# Patient Record
Sex: Male | Born: 1985 | Race: White | Hispanic: No | Marital: Single | Smoking: Never smoker
Health system: Southern US, Community
[De-identification: ages and names within clinical notes are randomized; demographics above are authoritative.]

## PROBLEM LIST (undated history)

## (undated) VITALS — BP 131/80 | HR 89 | Temp 98.0°F | Resp 18

## (undated) DIAGNOSIS — F259 Schizoaffective disorder, unspecified: Secondary | ICD-10-CM

## (undated) DIAGNOSIS — F329 Major depressive disorder, single episode, unspecified: Secondary | ICD-10-CM

## (undated) DIAGNOSIS — I10 Essential (primary) hypertension: Secondary | ICD-10-CM

## (undated) DIAGNOSIS — F32A Depression, unspecified: Secondary | ICD-10-CM

## (undated) DIAGNOSIS — F209 Schizophrenia, unspecified: Secondary | ICD-10-CM

## (undated) DIAGNOSIS — F419 Anxiety disorder, unspecified: Secondary | ICD-10-CM

## (undated) HISTORY — PX: BACK SURGERY: SHX140

---

## 2005-10-08 ENCOUNTER — Emergency Department (HOSPITAL_COMMUNITY): Admission: EM | Admit: 2005-10-08 | Discharge: 2005-10-08 | Payer: Self-pay | Admitting: *Deleted

## 2005-10-09 ENCOUNTER — Emergency Department (HOSPITAL_COMMUNITY): Admission: EM | Admit: 2005-10-09 | Discharge: 2005-10-10 | Payer: Self-pay | Admitting: Emergency Medicine

## 2008-06-25 ENCOUNTER — Other Ambulatory Visit: Payer: Self-pay | Admitting: Emergency Medicine

## 2008-06-25 ENCOUNTER — Inpatient Hospital Stay (HOSPITAL_COMMUNITY): Admission: EM | Admit: 2008-06-25 | Discharge: 2008-06-28 | Payer: Self-pay | Admitting: Psychiatry

## 2008-06-25 ENCOUNTER — Ambulatory Visit: Payer: Self-pay | Admitting: Psychiatry

## 2008-07-09 ENCOUNTER — Inpatient Hospital Stay (HOSPITAL_COMMUNITY): Admission: EM | Admit: 2008-07-09 | Discharge: 2008-07-14 | Payer: Self-pay | Admitting: Psychiatry

## 2009-01-18 ENCOUNTER — Other Ambulatory Visit: Payer: Self-pay | Admitting: Emergency Medicine

## 2009-01-20 ENCOUNTER — Ambulatory Visit: Payer: Self-pay | Admitting: Psychiatry

## 2009-01-20 ENCOUNTER — Inpatient Hospital Stay (HOSPITAL_COMMUNITY): Admission: RE | Admit: 2009-01-20 | Discharge: 2009-01-23 | Payer: Self-pay | Admitting: Psychiatry

## 2010-08-24 LAB — POCT I-STAT, CHEM 8
Calcium, Ion: 1.25 mmol/L (ref 1.12–1.32)
Chloride: 104 mEq/L (ref 96–112)
Creatinine, Ser: 0.9 mg/dL (ref 0.4–1.5)
Potassium: 3.8 mEq/L (ref 3.5–5.1)
Sodium: 138 mEq/L (ref 135–145)

## 2010-08-24 LAB — DIFFERENTIAL
Basophils Absolute: 0 10*3/uL (ref 0.0–0.1)
Basophils Relative: 0 % (ref 0–1)
Eosinophils Absolute: 0.2 10*3/uL (ref 0.0–0.7)
Eosinophils Relative: 2 % (ref 0–5)
Neutrophils Relative %: 71 % (ref 43–77)

## 2010-08-24 LAB — CBC
HCT: 46.6 % (ref 39.0–52.0)
Hemoglobin: 16.4 g/dL (ref 13.0–17.0)
MCHC: 35.1 g/dL (ref 30.0–36.0)
MCV: 85.7 fL (ref 78.0–100.0)
RBC: 5.43 MIL/uL (ref 4.22–5.81)
RDW: 12.7 % (ref 11.5–15.5)

## 2010-08-24 LAB — RAPID URINE DRUG SCREEN, HOSP PERFORMED
Amphetamines: NOT DETECTED
Barbiturates: NOT DETECTED
Benzodiazepines: NOT DETECTED
Cocaine: NOT DETECTED
Tetrahydrocannabinol: NOT DETECTED

## 2010-08-24 LAB — ETHANOL: Alcohol, Ethyl (B): <5 mg/dL (ref 0–10)

## 2010-09-04 LAB — BASIC METABOLIC PANEL
BUN: 12 mg/dL (ref 6–23)
CO2: 27 mEq/L (ref 19–32)
Creatinine, Ser: 1.1 mg/dL (ref 0.4–1.5)
Potassium: 3.5 mEq/L (ref 3.5–5.1)

## 2010-09-04 LAB — POCT TOXICOLOGY PANEL: Cocaine: POSITIVE

## 2010-09-04 LAB — CBC
HCT: 46.2 % (ref 39.0–52.0)
MCV: 86 fL (ref 78.0–100.0)
RDW: 11.5 % (ref 11.5–15.5)
WBC: 10.5 10*3/uL (ref 4.0–10.5)

## 2010-09-04 LAB — DIFFERENTIAL
Basophils Relative: 1 % (ref 0–1)
Lymphocytes Relative: 20 % (ref 12–46)
Lymphs Abs: 2.1 10*3/uL (ref 0.7–4.0)
Monocytes Absolute: 0.6 10*3/uL (ref 0.1–1.0)
Monocytes Relative: 6 % (ref 3–12)
Neutro Abs: 7.4 10*3/uL (ref 1.7–7.7)
Neutrophils Relative %: 71 % (ref 43–77)

## 2010-09-04 LAB — URINALYSIS, ROUTINE W REFLEX MICROSCOPIC
Bilirubin Urine: NEGATIVE
Protein, ur: NEGATIVE mg/dL
Specific Gravity, Urine: 1.025 (ref 1.005–1.030)
Urobilinogen, UA: 2 mg/dL — ABNORMAL HIGH (ref 0.0–1.0)
pH: 6.5 (ref 5.0–8.0)

## 2010-10-02 NOTE — H&P (Signed)
NAME:  [name], [name]             ACCOUNT NO.:  [id number]   MEDICAL RECORD NO.:  [id number]          PATIENT TYPE:  IPS   LOCATION:  0400                          FACILITY:  BH   PHYSICIAN:  Vic Ripper, P.A.-C.DATE OF BIRTH:  May 28, 1985   DATE OF ADMISSION:  07/09/2008  DATE OF DISCHARGE:                       PSYCHIATRIC ADMISSION ASSESSMENT   This is a voluntary admission to the services of Dr. Geralyn Flash.   IDENTIFYING INFORMATION:  This is a 25 year old, single, white male.  He  presented to the admissions office here yesterday.  He reported that he  was here this time on his own because he needs help with his cocaine  addiction.  [facility]  Yesterday, he stated that he has not been taking his  medication and did not go to this followup appointment.  Yesterday, he  had disorganized thinking, poor concentration, poor memory, paranoid  ideation.  He reported seeing people who are dead.  He had spent his  disability check on cocaine, and his mother's house is in danger of  foreclosure.   PAST PSYCHIATRIC HISTORY:  He was just recently with [place] February 6  through February 9.  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  He had been poorly compliant with meds and had  last received an injection of an antipsychotic medication in November of  2009.  He presented at that time with paranoia, thought disorganization,  and delusionality.   SOCIAL HISTORY:  He reports that he had 1.5 years at [employer] actually  studying Psychology.  He is currently staying with his mom.  He gets an  SSDI check of about $700 a month.   FAMILY HISTORY:  Negative.   ALCOHOL AND DRUG HISTORY:  He is a nonsmoker.  He has been using cocaine  and marijuana of late.   PRIMARY CARE Raquell Richer:  [facility], and he goes to [facility].   MEDICAL PROBLEMS:  None.   MEDICATIONS:  At the time of his discharge on February 9th, he was on  Zyprexa Zydis 5 mg 1 b.i.d.   DRUG ALLERGIES:  No known drug allergies.   POSITIVE PHYSICAL FINDINGS:  His labs were not repeated as nobody  ordered any yesterday and he was just discharged.  His vital signs on  admission show that he is 69 inches tall, weighs 229, temperature is  98.1, blood pressure was 150/92, pulse 101, respirations 20.   MENTAL STATUS EXAM:  He is alert and oriented.  He was casually but  appropriately groomed, dressed, and nourished.  His speech was clear,  coherent.  Thought processes were clear, rational, and goal oriented.  He wants to break his addiction to cocaine.  He denied being suicidal or  homicidal.  Judgment and insight were better.  Concentration and memory  were better.  Intelligence is at least average.  He stated that the  Zyprexa had been clearing his thoughts and he is less disoriented.   DIAGNOSES:  AXIS I:  1. Cocaine abuse, rule out dependence.  2. Psychosis, not otherwise specified, versus substance-induced      psychosis.   AXIS II:  Deferred.   AXIS III:  Obesity.   AXIS IV:  Severe stressors, mostly financial.   AXIS V:  Twenty-five.   The plan is to reestablish compliance with his Zyprexa and to address  his cravings through Symmetrel 100 mg p.o. b.i.d.   ESTIMATED LENGTH OF STAY:  Three to 5 days.      Vic Ripper, P.A.-C.     MD/MEDQ  D:  07/10/2008  T:  07/10/2008  Job:  [phone]

## 2010-10-02 NOTE — H&P (Signed)
NAMEHURBERT, [name] NO.:  [id number]   MEDICAL RECORD NO.:  [id number]          PATIENT TYPE:  IPS   LOCATION:  0402                          FACILITY:  BH   PHYSICIAN:  Anselm Jungling, MD  DATE OF BIRTH:  April 04, 1986   DATE OF ADMISSION:  06/25/2008  DATE OF DISCHARGE:                       PSYCHIATRIC ADMISSION ASSESSMENT   The patient is a 25 year old male who was involuntarily petitioned on  June 25, 2008.   HISTORY OF PRESENT ILLNESS:  The patient is here on petition with papers  stating the patient has a history of catatonic schizophrenia,  hospitalized in June of 2007.  The patient has been poorly compliant  with medications.  Last received injection in November of 2009.  Today,  the patient is paranoid, disorganized and delusional.  Stole his  mother's car and was arrested, stating that the car blew up.  Using  cocaine sporadically.  The patient does report some sort of voices.  Does report that he is here for cocaine addiction.   PAST PSYCHIATRIC HISTORY:  First admission to the [facility].  Is a client at [facility].   SOCIAL HISTORY:  He is a 25 year old male who lives with his mother.  Reports a good relationship.  He finished high school is and on  disability.   FAMILY HISTORY:  Unknown.   ALCOHOL AND DRUG HISTORY:  He is a nonsmoker.  Denies any IV drug use,  and again has been using cocaine and marijuana.   PRIMARY CARE Jachob Mcclean:  [facility].   MEDICAL PROBLEMS:  The patient reports he is healthy.  Denies any acute  or chronic health issues.   MEDICATIONS:  Has been on Prolixin in the past.  Apparently was  receiving injections.  He reporting having problems with vivid dreams  while on medications.  Has been noncompliant with psychotropic  medications for some time.   DRUG ALLERGIES:  No known drug allergies.   PHYSICAL EXAMINATION:  GENERAL:  This appears to be a healthy, well-  nourished, well-developed male, fully assessed at [facility].  Physical exam was reviewed with no significant findings.  VITAL SIGNS:  Temperature of 98, 70 heart rate, 20 respirations, blood  pressure is 120/79.   LABORATORY DATA:  A urine drug screen is positive for THC, positive for  cocaine.  Alcohol level less than 5.  Glucose of 123.  CBC within normal  limits.  Urinalysis is negative.   MENTAL STATUS EXAM:  He is fully alert, cooperative, walking in the  hallway.  He has good eye contact.  Currently wearing hospital gown.  Easily approachable.  His speech is unclear.  He is pleasant and has a  sense humor about being hospitalized.  Overall, thought process are  coherent, although there still to be some moments of disorganization,  talk about hallucinations.  He does not appear to be actively  responding.  Cognitive function intact.  His judgment and insight is  fair.   IMPRESSION:  AXIS I:  Mood disorder, polysubstance abuse, history of  schizophrenia.  AXIS II:  Deferred.  AXIS III:  No known medical conditions.  AXIS IV:  Substance use.  AXIS V:  Current is 30.   PLAN:  Our plan is to contract for safety.  We will stabilize mood and  thinking.  Will have Zyprexa available on a p.r.n. basis for psychosis  and agitation.  Will adjust the patient's substance use.  Will contact  family for background and support and will reinforce medication  compliance and follow up.  Tentative length of stay at this time is 2-3  days.      Landry Corporal, N.P.      Anselm Jungling, MD  Electronically Signed    JO/MEDQ  D:  06/27/2008  T:  06/27/2008  Job:  [id number]

## 2010-10-02 NOTE — Discharge Summary (Signed)
NAMEMAK, BONNY             ACCOUNT NO.:  [id number]   MEDICAL RECORD NO.:  [id number]          PATIENT TYPE:  IPS   LOCATION:  0401                          FACILITY:  BH   PHYSICIAN:  Anselm Jungling, MD  DATE OF BIRTH:  [birth year]   DATE OF ADMISSION:  06/25/2008  DATE OF DISCHARGE:  06/28/2008                               DISCHARGE SUMMARY   IDENTIFYING DATA AND REASON FOR ADMISSION:  This was the first [facility]  admission for [place], a 25 year old male who was involuntarily committed  due to psychosis.  He came to [place] with a history of catatonic  schizophrenia, previously hospitalized elsewhere in June of 2007.  He  had recently been poorly compliant with medications.  He had last  received an injection of antipsychotic medication in November of 2009.  He presented with paranoia, thought disorganization, and delusionality.  Please refer to the admission note for further details pertaining to the  symptoms, circumstances and history that led to his hospitalization.  He  was given an initial Axis I diagnosis of mood disorder NOS,  polysubstance abuse and history of schizophrenia.   MEDICAL AND LABORATORY:  He was in good health without any active or  chronic medical problems.  He was medically and physically assessed by  the psychiatric nurse practitioner.  There were no significant medical  issues.   HOSPITAL COURSE:  The patient was admitted to the adult inpatient  psychiatric service.  He presented as a well-nourished, normally-  developed adult male who was quite approachable, but had unclear speech  in the initial interview.  His thought processes were fairly coherent,  although there were still some moments of disorganization, and he  admitted to hallucinations.   The patient was treated with a psychotropic regimen of Zyprexa, up to 5  mg b.i.d.  The patient had a very rapid and apparently complete response  to this.  He was pleasant and cooperative throughout his  stay.  By the  fourth hospital day, he appeared appropriate for discharge.  He agreed  to the following aftercare plan.   AFTERCARE:  The patient was to follow up at [facility] with an appointment to see their psychiatrist on July 05, 2008 at 8:20.   DISCHARGE MEDICATIONS:  Zyprexa Zydis 5 mg one b.i.d.   DISCHARGE DIAGNOSES:  AXIS I:  Psychosis NOS, resolving.  AXIS II:  Deferred.  AXIS III:  No acute or chronic illnesses.  AXIS IV:  Stressors severe.  AXIS V:  GAF on discharge 55.      Anselm Jungling, MD  Electronically Signed    SPB/MEDQ  D:  07/04/2008  T:  07/04/2008  Job:  [id number]

## 2010-10-05 NOTE — Discharge Summary (Signed)
[name], [name] NO.:  [id number]   MEDICAL RECORD NO.:  [id number]          PATIENT TYPE:  IPS   LOCATION:  0400                          FACILITY:  BH   PHYSICIAN:  [name], M.D. DATE OF BIRTH:  February 13, 1986   DATE OF ADMISSION:  07/09/2008  DATE OF DISCHARGE:  07/14/2008                               DISCHARGE SUMMARY   IDENTIFICATION:  This is a 25 year old single white male, who was  admitted on a voluntary basis on July 09, 2008.   IDENTIFYING INFORMATION:  This is a 25 year old single white male. He  presented to the admissions office yesterday.  He reported that he was  here this time on his own because he needs help with his cocaine  addiction.  He had been discharged from [facility] on  June 28, 2008.  Yesterday, he stated he has not been taking his  medication and did not go to his followup appointment.  Yesterday, he  had disorganized thinking, poor concentration, poor memory, paranoid  ideation.  He reported seeing people who are dead.  He had spent his  disability check on cocaine and his mother's house is in danger of  foreclosure.   PAST PSYCHIATRIC HISTORY:  The patient was just recently with [place] from  June 25, 2008, through June 28, 2008.  At that time, he came in  with a history of catatonic schizophrenia having previously been  hospitalized somewhere else in June 2007.  He had been poorly compliant  with medications and he last received an injection of an antipsychotic  medication in November 2009.  He presented at that time with paranoia,  thought disorganization, and delusional thinking.   FAMILY HISTORY:  Negative.   ALCOHOL AND DRUG HISTORY:  He is a nonsmoker.  He has been using cocaine  and marijuana lately.   MEDICAL PROBLEMS:  None.   MEDICATIONS:  At the time of discharge on June 28, 2008, he was on  Zyprexa Zydis 5 mg 1 p.o. b.i.d.   DRUG ALLERGIES:  No known drug allergies.   PHYSICAL FINDINGS:  There were no acute physical or medical problems  noted.   His labs were not repeated since he was just discharged.   HOSPITAL COURSE:  Upon admission, the patient was started on Zyprexa 5  mg b.i.d. and Symmetrel 100 mg b.i.d.  He was also started on trazodone  50 mg p.o. q.h.s. p.r.n. insomnia.  In individual sessions with me, the  patient was friendly and cooperative.  He stated he sometimes feels like  hurting himself when he feels very bad.  He was worried about his mother  because her house is being foreclosed on and he lives with her.  He  stated he was here for cocaine addiction.  On July 11, 2008, the  patient stated he was a little paranoid, a little depressed.  Mood was  depressed and anxious.  He is having positive auditory hallucinations,  voices in the back of my mind and there were no side effects noted to  any of his medications.  On July 12, 2008, the  patient found out his  uncle was coming to visit.  He states he does not get along with him.  His mood, there was some dysphoria due to talk with his mother, but it  was improved from yesterday.  There was no suicidal ideation and no  auditory or visual hallucinations.  On July 13, 2008, family visited  and they were very supportive.  He was less depressed and less anxious.  He still stated at times he was hearing voices telling me to do  things, but they were not commanding him to do bad things and on  July 14, 2008, the patient felt ready to go home.  His sleep was  good.  Appetite was good.  Mood was euthymic.  Affect, wide range with  no suicidal or homicidal ideation.  No thoughts of self-injurious  behavior.  No auditory or visual hallucinations.  No paranoia,  delusions.  Thoughts were logical and goal-directed.  Thought content,  no predominant theme.  Cognitive was grossly intact.  Insight fair.  Judgment fair.  Impulse control fair.  He had a family session with his  mother and  she was very supportive of him, though she was not as  optimistic about him being discharged.  She was worried, he would end up  back in the hospital soon.   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  Axis III:  None.  Axis IV:  Severe (stressors include financial issues, other psychosocial  issues, problems with primary support group, burden of psychiatric  illness, and chemical abuse illness).  Axis V:  Global assessment of functioning was 50 upon discharge.  Global  assessment of functioning was 25 upon admission.  Global assessment of  functioning highest past year was 60.   DISCHARGE PLAN:  There was no specific activity level or dietary  restrictions.   POSTHOSPITAL CARE PLANS:  The patient will go to the [facility] of  the [facility] on August 07, 2008.  He will go to the [facility] on  July 28, 2008, at 10:30 a.m.   DISCHARGE MEDICATIONS:  Zyprexa 5 mg in the morning and 10 mg at  bedtime, trazodone 50 mg at bedtime for sleep if needed.      [name], M.D.  Electronically Signed     BHS/MEDQ  D:  08/04/2008  T:  08/05/2008  Job:  [id number]

## 2011-04-30 ENCOUNTER — Ambulatory Visit: Payer: Medicare Other | Attending: Orthopedic Surgery | Admitting: Physical Therapy

## 2011-04-30 DIAGNOSIS — M25519 Pain in unspecified shoulder: Secondary | ICD-10-CM | POA: Insufficient documentation

## 2011-04-30 DIAGNOSIS — M542 Cervicalgia: Secondary | ICD-10-CM | POA: Insufficient documentation

## 2011-04-30 DIAGNOSIS — IMO0001 Reserved for inherently not codable concepts without codable children: Secondary | ICD-10-CM | POA: Insufficient documentation

## 2011-04-30 DIAGNOSIS — M25619 Stiffness of unspecified shoulder, not elsewhere classified: Secondary | ICD-10-CM | POA: Insufficient documentation

## 2011-05-03 ENCOUNTER — Ambulatory Visit: Payer: Medicare Other | Admitting: Physical Therapy

## 2011-05-06 ENCOUNTER — Ambulatory Visit: Payer: Medicare Other | Admitting: Physical Therapy

## 2011-05-09 ENCOUNTER — Ambulatory Visit: Payer: Medicare Other | Admitting: Physical Therapy

## 2011-05-16 ENCOUNTER — Ambulatory Visit: Payer: Medicare Other | Admitting: Physical Therapy

## 2011-06-06 ENCOUNTER — Ambulatory Visit: Payer: Medicare Other | Admitting: Physical Therapy

## 2011-06-19 DIAGNOSIS — Z23 Encounter for immunization: Secondary | ICD-10-CM | POA: Diagnosis not present

## 2011-06-19 DIAGNOSIS — G4700 Insomnia, unspecified: Secondary | ICD-10-CM | POA: Diagnosis not present

## 2011-06-25 ENCOUNTER — Inpatient Hospital Stay (HOSPITAL_COMMUNITY)
Admission: RE | Admit: 2011-06-25 | Discharge: 2011-06-27 | DRG: 885 | Disposition: A | Discharge status: Home / Self Care | Payer: Medicare Other | Attending: Psychiatry | Admitting: Psychiatry

## 2011-06-25 ENCOUNTER — Encounter (HOSPITAL_COMMUNITY): Payer: Self-pay | Admitting: *Deleted

## 2011-06-25 DIAGNOSIS — F1910 Other psychoactive substance abuse, uncomplicated: Secondary | ICD-10-CM | POA: Diagnosis present

## 2011-06-25 DIAGNOSIS — F3289 Other specified depressive episodes: Secondary | ICD-10-CM

## 2011-06-25 DIAGNOSIS — F1210 Cannabis abuse, uncomplicated: Secondary | ICD-10-CM

## 2011-06-25 DIAGNOSIS — F251 Schizoaffective disorder, depressive type: Secondary | ICD-10-CM | POA: Diagnosis present

## 2011-06-25 DIAGNOSIS — F329 Major depressive disorder, single episode, unspecified: Secondary | ICD-10-CM

## 2011-06-25 DIAGNOSIS — I10 Essential (primary) hypertension: Secondary | ICD-10-CM

## 2011-06-25 DIAGNOSIS — F17200 Nicotine dependence, unspecified, uncomplicated: Secondary | ICD-10-CM

## 2011-06-25 DIAGNOSIS — F259 Schizoaffective disorder, unspecified: Principal | ICD-10-CM

## 2011-06-25 HISTORY — DX: Depression, unspecified: F32.A

## 2011-06-25 HISTORY — DX: Essential (primary) hypertension: I10

## 2011-06-25 HISTORY — DX: Major depressive disorder, single episode, unspecified: F32.9

## 2011-06-25 LAB — COMPREHENSIVE METABOLIC PANEL
ALT: 45 U/L (ref 0–53)
AST: 26 U/L (ref 0–37)
Chloride: 98 mEq/L (ref 96–112)
Creatinine, Ser: 1.05 mg/dL (ref 0.50–1.35)
GFR calc non Af Amer: >90 mL/min (ref >90)
Sodium: 136 mEq/L (ref 135–145)

## 2011-06-25 LAB — CBC
HCT: 42.8 % (ref 39.0–52.0)
Hemoglobin: 15.3 g/dL (ref 13.0–17.0)
MCHC: 35.7 g/dL (ref 30.0–36.0)
WBC: 12.1 10*3/uL — ABNORMAL HIGH (ref 4.0–10.5)

## 2011-06-25 MED ORDER — ALUM & MAG HYDROXIDE-SIMETH 200-200-20 MG/5ML PO SUSP
30.0000 mL | ORAL | Status: DC | PRN
  Administered 2011-06-25: 30 mL via ORAL

## 2011-06-25 MED ORDER — ACETAMINOPHEN 325 MG PO TABS
650.0000 mg | ORAL_TABLET | Freq: Four times a day (QID) | ORAL | Status: DC | PRN
Start: 2011-06-25 — End: 2011-06-28

## 2011-06-25 MED ORDER — MAGNESIUM HYDROXIDE 400 MG/5ML PO SUSP: 30.0000 mL | Freq: Every day | ORAL | Status: DC | PRN

## 2011-06-25 MED ORDER — TRAZODONE HCL 50 MG PO TABS
50.0000 mg | ORAL_TABLET | Freq: Every day | ORAL | Status: DC
  Administered 2011-06-25: 50 mg via ORAL
  Filled 2011-06-25 (×3): qty 1

## 2011-06-25 NOTE — Progress Notes (Signed)
Recreation Therapy Notes  06/25/2011         Time: 1415      Group Topic/Focus: The focus of the group is on enhancing the patients' ability to cope with stressors by understanding what coping is, why it is important, the negative effects of stress and developing healthier coping skills. Patients asked to complete a fifteen minute plan, outlining three triggers, three supports, and fifteen coping activities.  Participation Level: Minimal  Participation Quality: Attentive  Affect: Appropriate  Cognitive: Oriented   Additional Comments: Patient late to group as he was newly admitted. Patient quickly opened up and became comfortable with his peers.   [name] 06/25/2011 3:47 PM

## 2011-06-25 NOTE — BH Assessment (Signed)
Assessment Note   [name] is an 26 y.o. male. Reported to his therapist and psychiatrist that he was not sleeping and felt depressed and suicidal for the past month He reports intent but no specific plan at this time. Marland Kitchen He reported about 1 month ago he tried to suffocate himself with his bed linen.  He failed to report this incident and now the thoughts are to great and is not sure what he may do. Pt is unable to contract for safety.  He reports taking OTC Zzzquile  for sleep which is not working and melotonin. He reports feeling more depressed, isolating,despondent, and low self-esteem.  Pt reports having a history of Schizo-Affective D/O but is not on medication for this diagnosis.  Pt reports smoking 2 marijuana blunts 3-4 times per week and smoking a pack of cigarettes daily. Pt reports a hx of cocaine abuse about 2 years ago. Pt is polite and willing to get treatment.         Axis I: Schizoaffective Disorder, Marijuana Abuse Axis II: Deferred Axis III:  Past Medical History  Diagnosis Date  . Hypertension   . Depression    Axis IV: other psychosocial or environmental problems Axis V: 21-30 behavior considerably influenced by delusions or hallucinations OR serious impairment in judgment, communication OR inability to function in almost all areas        Past Medical History: No past medical history on file.  No past surgical history on file.  Family History: No family history on file.  Social History:  does not have a smoking history on file. He does not have any smokeless tobacco history on file. His alcohol and drug histories not on file.  Additional Social History:    Allergies: Allergies not on file  Home Medications:  No current facility-administered medications on file as of 06/25/2011.   No current outpatient prescriptions on file as of 06/25/2011.    OB/GYN Status:  No LMP for male patient.  General Assessment Data Location of Assessment: [facility] - Closed Assessment  Services Living Arrangements: Parent Can pt return to current living arrangement?: Yes Admission Status: Voluntary Is patient capable of signing voluntary admission?: Yes Transfer from: Home Referral Source: Psychiatrist  Education Status Contact person: rosemary Lucena-mother-[phone]  Risk to self Suicidal Ideation: Yes-Currently Present Suicidal Intent: Yes-Currently Present Is patient at risk for suicide?: Yes Suicidal Plan?: No Access to Means: Yes Specify Access to Suicidal Means: anything What has been your use of drugs/alcohol within the last 12 months?: marijuana Previous Attempts/Gestures: Yes How many times?: 1  (1 month ago put bed covers over head to suffacate self) Other Self Harm Risks: yes Triggers for Past Attempts: Family contact Intentional Self Injurious Behavior: None Family Suicide History: No Recent stressful life event(s): Conflict (Comment);Recent negative physical changes (can't sleep  family not understanding his mental illness) Persecutory voices/beliefs?: No Depression: Yes Depression Symptoms: Insomnia;Isolating;Loss of interest in usual pleasures;Feeling worthless/self pity;Despondent Substance abuse history and/or treatment for substance abuse?: Yes Suicide prevention information given to non-admitted patients: Not applicable  Risk to Others Homicidal Ideation: No Thoughts of Harm to Others: No Current Homicidal Intent: No Current Homicidal Plan: No Access to Homicidal Means: No History of harm to others?: No Assessment of Violence: None Noted Does patient have access to weapons?: No Criminal Charges Pending?: No Does patient have a court date: No  Psychosis Hallucinations: None noted Delusions: None noted  Mental Status Report Appear/Hygiene: Improved Eye Contact: Good Motor Activity: Freedom of movement Speech: Logical/coherent Level  of Consciousness: Alert Mood: Depressed;Helpless;Worthless, low self-esteem Affect:  Appropriate to circumstance Anxiety Level: Minimal Thought Processes: Coherent;Relevant Judgement: Impaired Orientation: Person;Place;Time;Situation Obsessive Compulsive Thoughts/Behaviors: None  Cognitive Functioning Concentration: Normal Memory: Recent Intact;Remote Intact IQ: Average Insight: Poor Impulse Control: Poor Appetite: Fair Sleep: Decreased (broken sleep) Total Hours of Sleep: 6  Vegetative Symptoms: None  Prior Inpatient Therapy Prior Inpatient Therapy: Yes Prior Therapy Dates: 2 yrs ago Prior Therapy Facilty/Provider[facility] Reason for Treatment: schizo-affective  Prior Outpatient Therapy Prior Outpatient Therapy: Yes Prior Therapy Dates: currently Prior Therapy Facilty/Provider(s): [facility] sanders, therapist temasmith [phone]) Reason for Treatment: schizo-affective                     Additional Information 1:1 In Past 12 Months?: Yes CIRT Risk: Yes Elopement Risk: Yes Does patient have medical clearance?: Yes     Disposition:  Disposition Disposition of Patient: Inpatient treatment program Type of inpatient treatment program: Adult  On Site Evaluation by:   Reviewed with Physician:     Hattie Perch Winford 06/25/2011 1:38 PM

## 2011-06-25 NOTE — Progress Notes (Signed)
Requisitions for urinalysis printed and placed in bag. Pt complained of heart indigestion that was relieved with Mylanta. Pt was given his first dose of trazodone for sleep. Pt reported sleep as a primary problem for this pt. Pt is currently asleep at this time. Pt reports that his last Prolixin shot was Friday Feb 1st. This pt receives this shot once every 2 weeks.

## 2011-06-26 DIAGNOSIS — F259 Schizoaffective disorder, unspecified: Principal | ICD-10-CM

## 2011-06-26 DIAGNOSIS — F251 Schizoaffective disorder, depressive type: Secondary | ICD-10-CM | POA: Diagnosis present

## 2011-06-26 DIAGNOSIS — F1910 Other psychoactive substance abuse, uncomplicated: Secondary | ICD-10-CM | POA: Diagnosis present

## 2011-06-26 LAB — DRUGS OF ABUSE SCREEN W/O ALC, ROUTINE URINE
Amphetamine Screen, Ur: NEGATIVE
Benzodiazepines.: NEGATIVE
Creatinine,U: 281.5 mg/dL
Marijuana Metabolite: POSITIVE — AB
Phencyclidine (PCP): NEGATIVE

## 2011-06-26 LAB — URINALYSIS, ROUTINE W REFLEX MICROSCOPIC
Bilirubin Urine: NEGATIVE
Glucose, UA: NEGATIVE mg/dL
Hgb urine dipstick: NEGATIVE
Ketones, ur: NEGATIVE mg/dL
Leukocytes, UA: NEGATIVE
Nitrite: NEGATIVE
Protein, ur: NEGATIVE mg/dL
Specific Gravity, Urine: 1.027 (ref 1.005–1.030)
Urobilinogen, UA: 0.2 mg/dL (ref 0.0–1.0)
pH: 6 (ref 5.0–8.0)

## 2011-06-26 MED ORDER — NICOTINE 21 MG/24HR TD PT24
21.0000 mg | MEDICATED_PATCH | Freq: Every day | TRANSDERMAL | Status: DC
  Administered 2011-06-26 – 2011-06-27 (×2): 21 mg via TRANSDERMAL
  Filled 2011-06-26 (×3): qty 1

## 2011-06-26 MED ORDER — TRAZODONE HCL 50 MG PO TABS
150.0000 mg | ORAL_TABLET | Freq: Every day | ORAL | Status: DC
  Filled 2011-06-26 (×2): qty 3

## 2011-06-26 MED ORDER — NICOTINE 21 MG/24HR TD PT24
MEDICATED_PATCH | TRANSDERMAL | Status: AC
  Filled 2011-06-26: qty 1

## 2011-06-26 MED ORDER — RAMELTEON 8 MG PO TABS
8.0000 mg | ORAL_TABLET | Freq: Every day | ORAL | Status: DC
  Administered 2011-06-26: 8 mg via ORAL
  Filled 2011-06-26 (×3): qty 1

## 2011-06-26 MED ORDER — AMLODIPINE BESYLATE 5 MG PO TABS
5.0000 mg | ORAL_TABLET | Freq: Every day | ORAL | Status: DC
  Administered 2011-06-26 – 2011-06-27 (×2): 5 mg via ORAL
  Filled 2011-06-26: qty 1
  Filled 2011-06-26: qty 10
  Filled 2011-06-26 (×2): qty 1

## 2011-06-26 NOTE — Progress Notes (Signed)
Patient ID: [name], male   DOB: [birth year], 26 y.o.   MRN: [id number] Patient stated the reason he was admitted was because he has been unable to sleep. Stated he was able to sleep a little last night. Denies SI and contracts for safety. Stated he has been attending groups but he is not here for using drugs but because he cannot sleep. Patient has been pleasant and cooperative, behavior appropriate on the unit.

## 2011-06-26 NOTE — Progress Notes (Signed)
Patient ID: [name], male   DOB: Feb 05, 1986, 26 y.o.   MRN: [id number] Pt. Reports he has been clean from cocaine for 10 months wants to get clean now from marijuana. "time take a new road, a new path." Pt. Reports he has a family and they are supportive. He denies SHI. Staff will continue to monitor q34min for safety.
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
## 2011-06-26 NOTE — H&P (Signed)
Psychiatric Admission Assessment Adult  Patient Identification:  [name] Date of Evaluation:  06/26/2011 Chief Complaint:  Schizoaffective Disorder  Cannibus Abuse  History of Present Illness:: [name] is a 26 year old Hispanic male. Patient is a walk-in admission to [facility] Patient reports, "I came here to get help for my sleep and also my substance abuse problems. I have not been sleeping well for the past several months. I was using cocaine, I used cocaine x 3 years, but been clean x 2 months. I use cocaine to cope with life stressors. I also used cocaine to get away from the reality of life momentarily. My whole life is pretty unexciting. I was on probation for cocaine possession x 2 years. I had just completed the probation.  Now that I am not using cocaine, I replaced the habit with smoking pots. I smoke joint 3-4 times weekly. It helps me to relax and with sleep as well. I was diagnosed with schizoaffective disorder. I see Dr [facility]la [facility]. I had been to [facility] in the past. I take prolixin injection every 2 weeks. I last received Prolixin shot last Friday. I am good on that till next Friday again. I am basically here to get help for my substance abuse issues"  Mood Symptoms:  Hopelessness, Sadness, Depression Symptoms:  depressed mood, anxiety, insomnia, (Hypo) Manic Symptoms:  Irritable Mood, Anxiety Symptoms:  Excessive Worry, Psychotic Symptoms:  Hallucinations: None  PTSD Symptoms: Had a traumatic exposure:  None reported  Past Psychiatric History: Diagnosis: polysubstance abuse and dependency, Schizoaffective disorder  Hospitalizations: [facility], [facility]  Outpatient Care: Dr. Lelon Perla with [facility]  Substance Abuse Care:None reported  Self-Mutilation: Denies   Suicidal Attempts: Denies   Violent Behaviors: Denies   Past Medical History:   Past Medical History  Diagnosis Date  . Hypertension   . Depression    None. Allergies:  No Known  Allergies PTA Medications: No prescriptions prior to admission    Previous Psychotropic Medications:  Medication/Dose  Prolixin injections every 2 weeks.   Amlodipine 5 mg daily             Substance Abuse History in the last 12 months: Substance Age of 1st Use Last Use Amount Specific Type  Nicotine 17 Prior to hosp 1 pack daily Cigarettes  Alcohol Denies use     Cannabis 17  3-4 times a week Weed  Opiates Denies use     Cocaine 17 2 months ago    Methamphetamines Denies use     LSD Denies use     Ecstasy Denies use     Benzodiazepines Denies use     Caffeine      Inhalants      Others:                         Consequences of Substance Abuse: Medical Consequences:  Liver damage Legal Consequences:  Arrest, jail time Family Consequences:  Family discord  Social History: Current Place of Residence: Museum[place]gallery conserv[place] of [place]  Family Members: None reported Marital Status:  Single Children:0  Sons:0  Daughters:0 Relationships:None reported Education:  [employer] Problems/Performance: "I did really well in high school, I graduated with a 4.0 GPA" Religious Beliefs/Practices: None reported History of Abuse (Emotional/Phsycial/Sexual): None reported Occupational Experiences: Disabled Military History:  None. Legal History: "I just completed a 2 year probation for cocaine possession" Hobbies/Interests: None reported  Family History:  "Schizophrenia: Maternal side of  my family"  Mental Status Examination/Evaluation: Objective:  Appearance: Casual, Well Groomed and Obese  Eye Contact::  Good  Speech:  Clear and Coherent  Volume:  Normal  Mood:  Euthymic  Affect:  Appropriate  Thought Process:  Coherent and Intact  Orientation:  Full  Thought Content:  Rumination  Suicidal Thoughts:  No  Homicidal Thoughts:  No  Memory:  Immediate;   Good Recent;   Good Remote;   Good  Judgement:  Fair  Insight:  Present  Psychomotor Activity:   Normal  Concentration:  Fair  Recall:  Good  Akathisia:  No  Handed:  Right  AIMS (if indicated):     Assets:  Desire for Improvement  Sleep:  Number of Hours: 6            Assessment:    AXIS I:  Schizoaffective Disorder AXIS II:  Deferred AXIS III:   Past Medical History  Diagnosis Date  . Hypertension   . Depression    AXIS IV:  other psychosocial or environmental problems and Substance abuse/dependency AXIS V:  41-50 serious symptoms  Treatment Plan/Recommendations: Admit for safety and stabilization.                                                                Review and reinstate any pertinent home medications.                                                                Amlodipine 5 mg daily  Treatment Plan Summary: Daily contact with patient to assess and evaluate symptoms and progress in treatment Medication management Current Medications:  Current Facility-Administered Medications  Medication Dose Route Frequency Provider Last Rate Last Dose  . acetaminophen (TYLENOL) tablet 650 mg  650 mg Oral Q6H PRN Verne Spurr, PA      . alum & mag hydroxide-simeth (MAALOX/MYLANTA) 200-200-20 MG/5ML suspension 30 mL  30 mL Oral Q4H PRN Verne Spurr, PA   30 mL at 06/25/11 2003  . magnesium hydroxide (MILK OF MAGNESIA) suspension 30 mL  30 mL Oral Daily PRN Verne Spurr, PA      . nicotine (NICODERM CQ - dosed in mg/24 hours) 21 mg/24hr patch           . nicotine (NICODERM CQ - dosed in mg/24 hours) patch 21 mg  21 mg Transdermal Q0600 Alyson Kuroski-Mazzei, DO   21 mg at 06/26/11 0827  . traZODone (DESYREL) tablet 50 mg  50 mg Oral QHS Verne Spurr, PA   50 mg at 06/25/11 2204    Observation Level/Precautions:  Q 15 minutes checks for safety  Laboratory:  CBC Chemistry Profile UA drug toxicity  Psychotherapy:  Group  Medications:  See lists  Routine PRN Medications:  Yes  Consultations:  None indicated at this time  Discharge Concerns:  Staying sober    Other:     Armandina Stammer I 2/6/201310:58 AM

## 2011-06-26 NOTE — [facility]HH Suicide Risk Assessment (Signed)
Suicide Risk Assessment  Admission Assessment     Demographic factors:  Assessment Details Time of Assessment: Admission Current Mental Status:    Loss Factors:    Historical Factors:  Historical Factors: Family history of mental illness or substance abuse Risk Reduction Factors:  Risk Reduction Factors: Sense of responsibility to family;Living with another person, especially a relative;Positive therapeutic relationship  CLINICAL FACTORS:   Depression:   Anhedonia Insomnia Alcohol/Substance Abuse/Dependencies More than one psychiatric diagnosis Previous Psychiatric Diagnoses and Treatments  COGNITIVE FEATURES THAT CONTRIBUTE TO RISK:  Polarized thinking    Diagnoses:  Axis I: Polysubstance Abuse - In Early Remission. Schizoaffective Disorder - Depressed Type - Under Good Control.  The patient was seen today and reports the following:   Sleep: The patient reports to having significant difficulty initiating and maintaining sleep. Appetite: The patient reports a good appetite today.   Mild>(1-10) >Severe  Hopelessness (1-10): 0  Depression (1-10): 3  Anxiety (1-10): 0   Suicidal Ideation: The patient adamantly denies any suicidal ideations today.  Plan: No  Intent: No  Means: No   Homicidal Ideation: The patient adamantly denies any homicidal ideations today. Plan: No  Intent: No.  Means: No   Eye Contact: Good.  General Appearance/Behavior:  Neat and polite.  Motor Behavior: wnl.  Speech: Appropriate in rate and volume with no pressuring noted.  Mental Status: Alert and Oriented x 3  Level of Consciousness: Alert  Mood: Mildly depressed.  Affect: Slightly constricted today.  Anxiety: No anxiety noted or reported today.  Thought Process: wnl. Thought Content: The patient denies any auditory or visual hallucinations today as well as any delusional thinking. Perception: Fair. Judgment: Fair.  Insight: Fair. Cognition: Oriented to time, place and person.   Time  was spent today discussing with the patient his reason for admission.  The patient reports that he has been clean or sober for 2 months but is having significant difficulty initiating and maintaining sleep.  He states his primary reason for admission is to address his sleep difficulties.  The patient reports that he does not like the medications Trazodone or Seroquel but was recommended the medication Rozerem by his PCP.  This will be ordered tonight.  Treatment Plan Summary:  1. Daily contact with patient to assess and evaluate symptoms and progress in treatment  2. Medication management  3. The patient will deny suicidal ideations or homicidal ideations for 48 hours prior to discharge and have a depression and anxiety rating of 3 or less. The patient will also deny any auditory or visual hallucinations or delusional thinking or display any manic or hypomanic behaviors.  4. At time of discharge the patient will report no symptoms of substance withdrawal.  Plan:  1. Will continue the patient on his current none psychiatric medications.  2. Will start the patient on the medication Rozerem 8 mgs po qhs for sleep. 3. Will continue to monitor. 4. Possible discharge in morning.  SUICIDE RISK:   Minimal: No identifiable suicidal ideation.  Patients presenting with no risk factors but with morbid ruminations; may be classified as minimal risk based on the severity of the depressive symptoms.  [place] 06/26/2011, 4:36 PM

## 2011-06-26 NOTE — Progress Notes (Signed)
[facility] Group Notes:  (Counselor/Nursing/MHT/Case Management/Adjunct)  06/26/2011 12:23 PM  Type of Therapy:  Group Therapy at 11:00  Participation Level:  Minimal  Participation Quality:  Attentive and Sharing  Affect:  Appropriate  Cognitive:  Alert and Oriented  Insight:  Limited  Engagement in Group:  Limited  Engagement in Therapy:  Limited  Modes of Intervention:  Clarification, Socialization and Exploration  Summary of Progress/Problems:  [name] shared "difficulty with feelings of stubbornness, it's like I stay stuck just to show you."  Also shared comfortability with feelings of generousity.  I want to do what I want to do and want people to leave me alone yet I will do lots of things to help someone else out that surprise even me.  [name] also shared that he wishes to stop using THC in order to have more energy and more money.   [name] 06/26/2011, 4:53 PM   [facility] Group Notes:  (Counselor/Nursing/MHT/Case Management/Adjunct)  06/26/2011 4:53 PM  Type of Therapy:  Group Therapy at 11:00  Participation Level:  Minimal  Participation Quality:  Appropriate  Affect:  Anxious  Cognitive:  Alert and Oriented  Insight:  Limited  Engagement in Group:  Good  Engagement in Therapy:  Limited  Modes of Intervention:  Activity, Socialization and Support  Summary of Progress/Problems:  [name] willingly participated in group activity in which he read aloud a section on topic of discussion, Grief in Addiction.  [name] also took initiative to identify with reading on denial and overall appeared more involved in group session.    [name] 06/26/2011, 4:57 PM

## 2011-06-26 NOTE — Progress Notes (Signed)
Patient ID: [name], male   DOB: 06/17/1985, 26 y.o.   MRN: [id number] (D) Patient presents with flat affect and fair eye contact. States that he slept last night which he was surprised about. He admits that he has had issues with not sleeping for a while. Patient requested a nicotine patch. He shared that he was trying to do without one but the cravings were too great.  Patient feels as though his mood is improving. He brightens on approach and interacts with peers in day room. (A) Patient given nicotine patch. (R) Patient receptive to staff interaction. Denies SI/HI

## 2011-06-27 MED ORDER — TRAZODONE HCL 50 MG PO TABS
50.0000 mg | ORAL_TABLET | Freq: Every day | ORAL | Status: DC
  Filled 2011-06-27: qty 14

## 2011-06-27 MED ORDER — TRAZODONE HCL 50 MG PO TABS: 50.0000 mg | ORAL_TABLET | Freq: Every day | ORAL | Status: DC

## 2011-06-27 NOTE — Treatment Plan (Signed)
Interdisciplinary Treatment Plan Update (Adult)  Date: 06/27/2011  Time Reviewed: 10:10 AM   Progress in Treatment: Attending groups: Yes Participating in groups: Yes Taking medication as prescribed: Yes Tolerating medication: Yes   Family/Significant othe contact made:   Patient understands diagnosis:  Yes  As evidenced by asking for help with sleep, mood, marijuana abuse Discussing patient identified problems/goals with staff:  Yes  See below Medical problems stabilized or resolved:  Yes Denies suicidal/homicidal ideation: Yes  Per self inventory Issues/concerns per patient self-inventory:  None noted Other:  New problem(s) identified: N/A  Reason for Continuation of Hospitalization: Other; describe D/C today  Interventions implemented related to continuation of hospitalization:   Additional comments:  Estimated length of stay: D/C today  Discharge Plan: return home,  Follow up with ACT team  New goal(s): N/A  Review of initial/current patient goals per problem list:   1.  Goal(s):Increase sleep  Met:  Yes  Target date:2/7  As evidenced [id number] new med for sleep  2.  Goal (s):   Met:  Yes  Target date:  As evidenced by:  3.  Goal(s):  Met:  Yes  Target date:  As evidenced by:  4.  Goal(s):  Met:  Yes  Target date:  As evidenced by:  Attendees: Patient: [name]  06/27/2011 10:10 AM  Family:     Physician:  Lupe Carney 06/27/2011 10:10 AM   Nursing: Joaquin Bend   06/27/2011 10:10 AM   Case Manager:  Richelle Ito, LCSW 06/27/2011 10:10 AM   Counselor:  Ronda Fairly, LCSWA 06/27/2011 10:10 AM   Other: Verne Spurr  06/27/2011 10:10 AM  Other:     Other:     Other:      Scribe for Treatment Team:   Ida Rogue, 06/27/2011 10:10 AM

## 2011-06-27 NOTE — Progress Notes (Signed)
Patient has been up and in the milieu most of the day.  Attending some groups.  Discharged this evening.  Reviewed all discharge instructions, medications, and follow up care with patient and his mother.  Both verbalized understanding of all.  2 week supply of medications given to patient from the hospital pharmacy.  All belongings retrieved from room and all contents of locker returned.  Left the unit ambulatory with RN and escorted to the front lobby.

## 2011-06-27 NOTE — [facility] Suicide Risk Assessment (Signed)
Suicide Risk Assessment  Discharge Assessment      Demographic factors:  See chart.   Current Mental Status: Patient seen and evaluated in treatment team. Chart reviewed. Patient stated that his mood was "good". His affect was mood congruent and euthymic. He denied any current thoughts of self injurious behavior, suicidal ideation or homicidal ideation. He denied any significant depressive signs or symptoms at this time. There were no auditory or visual hallucinations, paranoia, delusional thought processes, or mania noted.  Thought process was linear and goal directed.  No psychomotor agitation or retardation was noted. His speech was normal rate, tone and volume. Eye contact was good. Judgment and insight are fair.  Patient has been up and engaged on the unit.  No safety concerns reported from team.   Loss Factors: none reported.   Historical Factors:  Historical Factors: Family history of mental illness or substance abuse; Denied Hx suicide attempts, yet has had episodic SI when "sick of life"; denied hx SIB  Risk Reduction Factors:  Risk Reduction Factors: Sense of responsibility to family;Living with another person, especially a relative;Positive therapeutic relationship; followed by ACT team; Good with IM Tx; lives with mom; takes Prolixin IM  CLINICAL FACTORS (noted upon admission):  Depression:   Anhedonia Insomnia Alcohol/Substance Abuse/Dependencies More than one psychiatric diagnosis Previous Psychiatric Diagnoses and Treatments  COGNITIVE FEATURES THAT CONTRIBUTE TO RISK (noted upon admission):  Polarized thinking    Diagnoses:  Axis I: Polysubstance Dependence; Cannabis Abuse; Schizoaffective Disorder - Depressed type, per Hx  SUICIDE RISK: Patient is currently viewed as a low risk of harm to himself and others in light of his history and risk factors. There are no acute safety concerns and he is stable for discharge. His continued sobriety, medication management and followup  will mitigate against any potential risk in the future. He is agreeable with the plan.  Discussed with the team.  PLAN: Pt seen and evaluated.  Chart reviewed.  Pt stable for and requesting discharge. Pt contracting for safety and does not currently meet [place] involuntary commitment criteria for continued hospitalization.  Mental health treatment, medication management and continued sobriety will mitigate against the increased risk of harm to self and/or others.  Discussed the importance of recovery further with pt, as well as, tools to move forward in a healthy & safe manner.  Pt agreeable with the plan.  Discussed with the team.  Please see orders, follow up plans per team and full discharge summary to be completed by physician extender.    [name] 06/27/2011, 1:45 PM

## 2011-06-27 NOTE — Progress Notes (Signed)
[facility] Group Notes:  (Counselor/Nursing/MHT/Case Management/Adjunct)  06/27/2011 6:16 PM  Type of Therapy:  Group Therapy at 11:00AM  Participation Level:  Did Not Attend   Clide Dales 06/27/2011, 6:16 PM

## 2011-06-27 NOTE — Progress Notes (Signed)
[facility] Adult Inpatient Family/Significant Other Suicide Prevention Education  Suicide Prevention Education:  Education Completed; Aasir Daigler at [phone] has been identified by the patient as the family member/significant other with whom the patient will be residing, and identified as the person(s) who will aid the patient in the event of a mental health crisis (suicidal ideations/suicide attempt).  With written consent from the patient, the family member/significant other has been provided the following suicide prevention education, prior to the and/or following the discharge of the patient.  The suicide prevention education provided includes the following:  Suicide risk factors  Suicide prevention and interventions  National Suicide Hotline telephone number  [facility] assessment telephone number  [facility] Emergency Assistance 911  [facility] and/or Residential Mobile Crisis Unit telephone number  Request made of family/significant other to:  Remove weapons (e.g., guns, rifles, knives), all items previously/currently identified as safety concern.    Remove drugs/medications (over-the-counter, prescriptions, illicit drugs), all items previously/currently identified as a safety concern.  The family member/significant other verbalizes understanding of the suicide prevention education information provided.  The family member/significant other agrees to remove the items of safety concern listed above.  Clide Dales 06/27/2011, 4:55 PM

## 2011-06-27 NOTE — Discharge Summary (Signed)
Physician Discharge Summary Note  Patient:  [name] is an 27 y.o., male MRN:  [id number] DOB:  05/02/1986 Patient phone:  [phone] (home)  Patient address:   [address] [place] [address],   Date of Admission:  06/25/2011 Date of Discharge: 06/27/2011 Reason for Admision: Substance abuse  Discharge Diagnoses: Principal Problem:  *Polysubstance abuse Active Problems:  Schizoaffective disorder, depressive type   Axis Diagnosis:   AXIS I:  Polysubstance Dependence; Cannabis Abuse; Schizoaffective Disorder - Depressed type, per Hx  AXIS II:  Deferred AXIS III:   Past Medical History  Diagnosis Date  . Hypertension   . Depression    AXIS IV:  problems related to legal system/crime and problems related to social environment AXIS V:  51-60 moderate symptoms  Level of Care:  OP  Hospital Course:  Unremarkable.   [name] had recently relapsed on Cocaine after having been clean for 2 years.  He is also smoking marijuana several times as week.  He presented as genuinely interested in changing his abuse of cocaine and marijuana.  He was active in unit programming and attended groups.  He was motivated and cooperative with the staff.  [name] got along well with other patients and was not a security risk.   On the 2nd day of his admission he was appropriate in behavior and anxious to return home. [name] worked well with the CM, SW, and Counselor to coordinate his follow up care and further his treatment. He will follow up with his ACT team and physician.   Consults:  None  Significant Diagnostic Studies:  None  Discharge Vitals:   Blood pressure 138/85, pulse 98, temperature 98 F (36.7 C), resp. rate 18, height 5\' 10"  (1.778 m), weight 118.842 kg (262 lb).  Mental Status Exam: See Mental Status Examination and Suicide Risk Assessment completed by Attending Physician prior to discharge.  Discharge destination:  Home  Is patient on multiple antipsychotic therapies at  discharge:  No   Has Patient had three or more failed trials of antipsychotic monotherapy by history:  No  Recommended Plan for Multiple Antipsychotic Therapies: NA Discharge Orders    Future Orders Please Complete By Expires   Diet - low sodium heart healthy      Increase activity slowly      Discharge instructions      Comments:   Take all medication as prescribed. Do not take over the counter sleep medication. Do not use marijuana as it will interfere with your prescribed medications.     Medication List  As of 06/27/2011 11:50 AM   START taking these medications         traZODone 50 MG tablet   Commonly known as: DESYREL   Take 1 tablet (50 mg total) by mouth at bedtime. For sleep.         CONTINUE taking these medications         amLODipine 5 MG tablet   Commonly known as: NORVASC      fluPHENAZine decanoate 25 MG/ML injection   Commonly known as: PROLIXIN         STOP taking these medications         Melatonin 3 MG Tabs          Where to get your medications    These are the prescriptions that you need to pick up.   You may get these medications from any pharmacy.         traZODone 50 MG tablet  Follow-up recommendations:  Other:  Continue all medications as directed and follow up with ACT team and your physician.   Signed: Rona Ravens. Nikiyah Fackler PAC 06/27/2011, 11:50 AM

## 2011-06-27 NOTE — Progress Notes (Signed)
[facility] Group Notes:  (Counselor/Nursing/MHT/Case Management/Adjunct)  06/27/2011 4:57 PM  Type of Therapy:  Group Therapy at 11:00  Participation Level:  Did Not Attend  Clide Dales 06/27/2011, 4:57 PM

## 2011-06-27 NOTE — Progress Notes (Signed)
[facility] Case Management Discharge Plan:  Will you be returning to the same living situation after discharge: Yes,  home At discharge, do you have transportation home?:Yes,  family Do you have the ability to pay for your medications:Yes,  Act team  Interagency Information:     Release of information consent forms completed and in the chart;  Patient's signature needed at discharge.  Patient to Follow up at:  Follow-up Information    Follow up with [facility] on 06/28/2011. (ACT Team)    Contact information:   [address] [place] [address] Phone- [phone]         Patient denies SI/HI:   Yes,  yes    Safety Planning and Suicide Prevention discussed:  Yes,  yes  Barrier to discharge identified:No.  Summary and Recommendations:   [name] 06/27/2011, 4:23 PM

## 2011-06-29 LAB — THC (MARIJUANA), URINE, CONFIRMATION: Marijuana, Ur-Confirmation: 504 NG/ML — ABNORMAL HIGH

## 2011-07-01 NOTE — Progress Notes (Signed)
Patient Discharge Instructions:  Admission Note Faxed,  06/28/2011 After Visit Summary Faxed,  06/28/2011 Faxed to the Next Level Care provider:  06/28/2011 [facility] faxed 06/28/2011  Faxed to [facility] - ACT @ [phone]  Wandra Scot, 07/01/2011, 10:59 AM

## 2011-07-24 ENCOUNTER — Ambulatory Visit (INDEPENDENT_AMBULATORY_CARE_PROVIDER_SITE_OTHER): Payer: Medicare Other | Admitting: Pulmonary Disease

## 2011-07-24 ENCOUNTER — Encounter: Payer: Self-pay | Admitting: Pulmonary Disease

## 2011-07-24 VITALS — BP 124/82 | HR 94 | Temp 98.7°F | Ht 71.0 in | Wt 269.0 lb

## 2011-07-24 DIAGNOSIS — G4700 Insomnia, unspecified: Secondary | ICD-10-CM | POA: Insufficient documentation

## 2011-07-24 NOTE — Assessment & Plan Note (Signed)
The pt has significant issues with sleep onset and maintenance that I suspect is due to terrible sleep hygiene.  He is also at significant risk for sleep apnea, and I have asked the pt and family member to monitor his sleep to see if this may be an issue.  I have had a long discussion with the pt about sleep hygiene and stimulus control, and would also like him to totally avoid the bed or napping during the day.  He will try this for next 4 weeks, and return for followup.

## 2011-07-24 NOTE — Progress Notes (Signed)
Subjective:    Patient ID: [name], male    DOB: Nov 22, 1985, 26 y.o.   MRN: [id number]  HPI The patient is a 26 year old male who comes in today as a self-referral for issues involving sleep onset and maintenance.  The patient states that he has had sleeping problems for at least the last 6 months, and feels like they may be getting worse.  The patient typically goes to bed between 9 and 10, however will often watch movies on his laptop, play computer games, listen to music, or watch television while lying in bed.  The patient states it takes about 1-2 hours for him to get to sleep during this time, but he will have multiple awakenings during the night.  If he does wake up, it takes him 30 minutes or less to get back to sleep.  The mother has noted loud snoring, but she is unsure if he has an abnormal breathing pattern during sleep.  The patient denies choking arousals.  He does admit to having sleepiness during the day but is dependent upon how much sleep he had the night before.  He typically gets up between 10 and 11 AM to start his day.  He also admits to taking naps at least 3 days out of 7.  He does not drink caffeinated beverages on a routine basis, but only occasionally.  He notes that his weight is up 20 pounds over the last 2 years, and his epworth score today is 9.  Sleep Questionnaire: What time do you typically go to bed?( Between what hours) 9-10pm How long does it take you to fall asleep? 1 to 2 hours How many times during the night do you wake up? 3 What time do you get out of bed to start your day? 1100 Do you drive or operate heavy machinery in your occupation? No How much has your weight changed (up or down) over the past two years? (In pounds) 40 lb (18.144 kg) Have you ever had a sleep study before? No Do you currently use CPAP? No Do you wear oxygen at any time? No   Review of Systems  Constitutional: Negative for fever and unexpected weight change.  HENT: Negative for ear  pain, nosebleeds, congestion, sore throat, rhinorrhea, sneezing, trouble swallowing, dental problem, postnasal drip and sinus pressure.   Eyes: Negative for redness and itching.  Respiratory: Negative for cough, chest tightness, shortness of breath and wheezing.   Cardiovascular: Negative for palpitations and leg swelling.  Gastrointestinal: Negative for nausea and vomiting.  Genitourinary: Negative for dysuria.  Musculoskeletal: Negative for joint swelling.  Skin: Negative for rash.  Neurological: Negative for headaches.  Hematological: Does not bruise/bleed easily.  Psychiatric/Behavioral: Negative for dysphoric mood. The patient is not nervous/anxious.        Objective:   Physical Exam Constitutional:  Obese male with large neck, no acute distress  HENT:  Nares patent without discharge  Oropharynx without exudate, palate and uvula are thick and elongated.   Eyes:  Perrla, eomi, no scleral icterus  Neck:  No JVD, no TMG  Cardiovascular:  Normal rate, regular rhythm, no rubs or gallops.  No murmurs        Intact distal pulses  Pulmonary :  Normal breath sounds, no stridor or respiratory distress   No rales, rhonchi, or wheezing  Abdominal:  Soft, nondistended, bowel sounds present.  No tenderness noted.   Musculoskeletal:  No lower extremity edema noted.  Lymph Nodes:  No cervical lymphadenopathy noted  Skin:  No cyanosis noted  Neurologic:  Alert, appropriate, moves all 4 extremities without obvious deficit.         Assessment & Plan:

## 2011-07-24 NOTE — Patient Instructions (Signed)
Choose a bedtime between 11p to 1a and stick with it nightly.  No sleeping any later than 8am Do not read, watch tv, listen to music, or play video games while in bed at night.  Do not get in bed at night unless you are ready to turn out light and go to sleep.  Do not ever get in bed during day. If you cannot get to sleep within , get out of bed and go to family room or a chair in your room.  You can read or watch tv, but nothing else until you think you can go back to sleep.  Then can get back in bed.  If you cannot get to sleep in , repeat the same thing over again until you fall asleep. No caffeine after 10am, and NO napping during the day. Monitor sleep for abnormal breathing pattern during sleep and choking during sleep that may wake you up Work on weight loss followup with me in 4 weeks.

## 2011-08-19 DIAGNOSIS — R0609 Other forms of dyspnea: Secondary | ICD-10-CM | POA: Diagnosis not present

## 2011-08-19 DIAGNOSIS — R0989 Other specified symptoms and signs involving the circulatory and respiratory systems: Secondary | ICD-10-CM | POA: Diagnosis not present

## 2011-08-19 DIAGNOSIS — J3489 Other specified disorders of nose and nasal sinuses: Secondary | ICD-10-CM | POA: Diagnosis not present

## 2011-08-21 ENCOUNTER — Ambulatory Visit: Payer: Self-pay | Admitting: Pulmonary Disease

## 2011-09-04 ENCOUNTER — Ambulatory Visit: Payer: Self-pay | Admitting: Pulmonary Disease

## 2011-09-10 DIAGNOSIS — R064 Hyperventilation: Secondary | ICD-10-CM | POA: Diagnosis not present

## 2011-09-10 DIAGNOSIS — F209 Schizophrenia, unspecified: Secondary | ICD-10-CM | POA: Diagnosis not present

## 2011-09-15 ENCOUNTER — Emergency Department (HOSPITAL_COMMUNITY)
Admission: EM | Admit: 2011-09-15 | Discharge: 2011-09-17 | Disposition: A | Discharge status: Other Acute Inpatient Hospital | Payer: Medicare Other | Attending: Emergency Medicine | Admitting: Emergency Medicine

## 2011-09-15 ENCOUNTER — Encounter (HOSPITAL_COMMUNITY): Payer: Self-pay

## 2011-09-15 DIAGNOSIS — F329 Major depressive disorder, single episode, unspecified: Secondary | ICD-10-CM | POA: Insufficient documentation

## 2011-09-15 DIAGNOSIS — R45851 Suicidal ideations: Secondary | ICD-10-CM | POA: Insufficient documentation

## 2011-09-15 DIAGNOSIS — F17200 Nicotine dependence, unspecified, uncomplicated: Secondary | ICD-10-CM | POA: Insufficient documentation

## 2011-09-15 DIAGNOSIS — F411 Generalized anxiety disorder: Secondary | ICD-10-CM | POA: Insufficient documentation

## 2011-09-15 DIAGNOSIS — I10 Essential (primary) hypertension: Secondary | ICD-10-CM | POA: Insufficient documentation

## 2011-09-15 DIAGNOSIS — R4182 Altered mental status, unspecified: Secondary | ICD-10-CM | POA: Insufficient documentation

## 2011-09-15 DIAGNOSIS — F259 Schizoaffective disorder, unspecified: Secondary | ICD-10-CM | POA: Insufficient documentation

## 2011-09-15 DIAGNOSIS — F3289 Other specified depressive episodes: Secondary | ICD-10-CM | POA: Insufficient documentation

## 2011-09-15 DIAGNOSIS — F209 Schizophrenia, unspecified: Secondary | ICD-10-CM | POA: Diagnosis not present

## 2011-09-15 DIAGNOSIS — F1210 Cannabis abuse, uncomplicated: Secondary | ICD-10-CM | POA: Insufficient documentation

## 2011-09-15 HISTORY — DX: Schizophrenia, unspecified: F20.9

## 2011-09-15 LAB — BASIC METABOLIC PANEL
BUN: 11 mg/dL (ref 6–23)
Calcium: 9.7 mg/dL (ref 8.4–10.5)
Creatinine, Ser: 0.88 mg/dL (ref 0.50–1.35)
GFR calc Af Amer: >90 mL/min (ref >90)
GFR calc non Af Amer: >90 mL/min (ref >90)

## 2011-09-15 LAB — CBC
HCT: 44.9 % (ref 39.0–52.0)
MCH: 30.3 pg (ref 26.0–34.0)
MCHC: 37 g/dL — ABNORMAL HIGH (ref 30.0–36.0)
MCV: 82.1 fL (ref 78.0–100.0)
Platelets: 324 10*3/uL (ref 150–400)
RDW: 12 % (ref 11.5–15.5)
WBC: 13.8 10*3/uL — ABNORMAL HIGH (ref 4.0–10.5)

## 2011-09-15 LAB — RAPID URINE DRUG SCREEN, HOSP PERFORMED
Barbiturates: NOT DETECTED
Benzodiazepines: NOT DETECTED

## 2011-09-15 LAB — ETHANOL: Alcohol, Ethyl (B): <11 mg/dL (ref 0–11)

## 2011-09-15 MED ORDER — AMMONIA AROMATIC IN INHA
RESPIRATORY_TRACT | Status: AC
  Filled 2011-09-15: qty 10

## 2011-09-15 MED ORDER — IBUPROFEN 200 MG PO TABS: 400.0000 mg | ORAL_TABLET | Freq: Four times a day (QID) | ORAL | Status: DC | PRN

## 2011-09-15 MED ORDER — NICOTINE 21 MG/24HR TD PT24
21.0000 mg | MEDICATED_PATCH | Freq: Every day | TRANSDERMAL | Status: DC
  Administered 2011-09-16: 21 mg via TRANSDERMAL
  Filled 2011-09-15: qty 1

## 2011-09-15 MED ORDER — FLUPHENAZINE DECANOATE 25 MG/ML IJ SOLN: 37.5000 mg | INTRAMUSCULAR | Status: DC

## 2011-09-15 MED ORDER — ZIPRASIDONE MESYLATE 20 MG IM SOLR
20.0000 mg | Freq: Once | INTRAMUSCULAR | Status: AC
  Administered 2011-09-16: 20 mg via INTRAMUSCULAR
  Filled 2011-09-15: qty 20

## 2011-09-15 NOTE — ED Notes (Signed)
Provided pt with urinal

## 2011-09-15 NOTE — ED Provider Notes (Signed)
History     CSN: [id number]  Arrival date & time 09/15/11  1807   First MD Initiated Contact with Patient 09/15/11 1817      Chief Complaint  Patient presents with  . Altered Mental Status    The history is provided by the patient, medical records and a relative. History Limited By: LEVEL V CAVEAT: uncooperative.  brought to ER by mother for bizarre behavior. Found lying naked on the floor today. Reports to marijuana abuse today. Pt will only speak some times and looks straight ahead. No hx of seizures. receives prolixin shots at home by home health.  Hx of depression and HTN. No recent trauma. Mother reports no recent illness. No complaints to her of HA, vomiting , fever or diarrhea.   Past Medical History  Diagnosis Date  . Hypertension   . Depression   . Schizophrenia     No past surgical history on file.  Family History  Problem Relation Age of Onset  . Allergies Mother   . Asthma Mother     History  Substance Use Topics  . Smoking status: Current Everyday Smoker -- 1.0 packs/day for 8 years    Types: Cigarettes  . Smokeless tobacco: Not on file  . Alcohol Use: No      Review of Systems  Unable to perform ROS Psychiatric/Behavioral: Positive for altered mental status.    Allergies  Review of patient's allergies indicates no known allergies.  Home Medications   Current Outpatient Rx  Name Route Sig Dispense Refill  . DIPHENHYDRAMINE HCL 50 MG/30ML PO LIQD Oral Take 30 mLs by mouth at bedtime as needed. As sleep aid    . FLUPHENAZINE DECANOATE 25 MG/ML IJ SOLN Intramuscular Inject 37.5 mg into the muscle every 14 (fourteen) days.    . IBUPROFEN 400 MG PO TABS Oral Take 400 mg by mouth every 6 (six) hours as needed. For pain relief      BP 138/85  Pulse 105  Temp(Src) 99.5 F (37.5 C) (Oral)  Resp 28  SpO2 97%  Physical Exam  Nursing note and vitals reviewed. Constitutional: He appears well-developed and well-nourished.  HENT:  Head: Normocephalic  and atraumatic.  Eyes: EOM are normal.  Neck: Normal range of motion.  Cardiovascular: Normal rate, regular rhythm, normal heart sounds and intact distal pulses.   Pulmonary/Chest: Effort normal and breath sounds normal. No respiratory distress.  Abdominal: Soft. He exhibits no distension. There is no tenderness.  Musculoskeletal: Normal range of motion.  Neurological: He is alert.       Follows simple commands  Skin: Skin is warm and dry.  Psychiatric:       Flat affect. Poor eye contact.     ED Course  Procedures (including critical care time)  Labs Reviewed  GLUCOSE, CAPILLARY - Abnormal; Notable for the following:    Glucose-Capillary 189 (*)    All other components within normal limits  CBC - Abnormal; Notable for the following:    WBC 13.8 (*)    MCHC 37.0 (*)    All other components within normal limits  BASIC METABOLIC PANEL - Abnormal; Notable for the following:    Sodium 134 (*)    Glucose, Bld 150 (*)    All other components within normal limits  URINE RAPID DRUG SCREEN (HOSP PERFORMED) - Abnormal; Notable for the following:    Tetrahydrocannabinol POSITIVE (*)    All other components within normal limits  ETHANOL   No results found.   No  diagnosis found.    MDM  Hx of schizophrenia. Compliant with his prolixin. Reports he is suicidal and wants to die. Marijuana abuse. Will ask psychiatry and ACT to evaluate the patient        Lyanne Co, MD 09/15/11 2311

## 2011-09-15 NOTE — ED Notes (Signed)
Mother at bedside, pt responds to his name, knows he is in hospital, when ask how he feels; he said not feeling good, and when ask why he won't talk, pt responded he can't talk, when ask if he smoke marijuana his afternoon, he states "yes". Pt is mainly mute. sts pain everywhere. Able to follow command, c/o unable to urinate to his mother prior to arrival.  Pupil reactive, able to move all extremities.

## 2011-09-15 NOTE — ED Notes (Signed)
Pt placed in paper scrubs.

## 2011-09-15 NOTE — ED Notes (Signed)
Pt states "i feel restless, i need something", pt lying comfortably in bed, mother remains bedside, resp even unlabored, skin pwd, ALP notified, awaiting orders, cont to monitor

## 2011-09-15 NOTE — ED Notes (Signed)
ZOX:[id number]<AV> Expected date:09/15/11<BR> Expected time: 6:05 PM<BR> Means of arrival:Ambulance<BR> Comments:<BR> M80. 25 m. ALOC. Unknown onset, unknown etiology, hx of HTN, BH/psych, marijuana use. bp elevated. 15 mins.

## 2011-09-15 NOTE — ED Notes (Signed)
Upon entering the room, pt noticed to have pulled his IV out, and disconnected his monitor leads. Eyes open, staring at the ceiling, un responding to voice, and touch, and sternum rub. EDP called to room and assessed the patient. Give Ammonia Inhalant to sniff and pt immediately responded, and started to talk to Dr. Patria Mane and answering all questions. EDP sts no IV Lock is needed at this time.

## 2011-09-15 NOTE — ED Notes (Signed)
Lab bedside to obtain sample 

## 2011-09-15 NOTE — ED Notes (Signed)
Mom called EMS reports pt had ALOC, ems found pt lying on floor naked, does occasionally respond to voice, name and answer questions but mostly mute and lethargic. Pt is a [facility] patient, been hospitalize in the past. Hx of schizophrenia, and HTN, smokes marijuana, last use few days ago

## 2011-09-15 NOTE — ED Notes (Signed)
Dr Patria Mane bedside to eval pt

## 2011-09-16 MED ORDER — ZIPRASIDONE MESYLATE 20 MG IM SOLR: 20.0000 mg | Freq: Two times a day (BID) | INTRAMUSCULAR | Status: DC | PRN

## 2011-09-16 MED ORDER — ALUM & MAG HYDROXIDE-SIMETH 200-200-20 MG/5ML PO SUSP
30.0000 mL | ORAL | Status: DC | PRN
  Administered 2011-09-16: 30 mL via ORAL
  Filled 2011-09-16: qty 30

## 2011-09-16 MED ORDER — LORAZEPAM 1 MG PO TABS
1.0000 mg | ORAL_TABLET | Freq: Four times a day (QID) | ORAL | Status: DC | PRN
  Administered 2011-09-16 (×3): 1 mg via ORAL
  Filled 2011-09-16 (×3): qty 1

## 2011-09-16 NOTE — ED Provider Notes (Signed)
Patient with history of schizophrenia and has been taking thc with increased bizarre behavior.  Patient awaiting ACT evaluation.   Hilario Quarry, MD 09/16/11 [phone]

## 2011-09-16 NOTE — BH Assessment (Signed)
Assessment Note   [name] is an 26 y.o. male. Patient brought to ER by mother for bizarre behavior. Found lying naked on the floor today. Patient sts that he was found unconscious due to "smoking weed". His sts, "You just never know what's in that stuff". Patient believes the reason for passing out was related to [facility] use. Reports last use of marijuana was yesterday. He is currently receiving out-pt therapy for his on-going marijuana use. Sts he "can't kick the habit". Patient  also receiving out-pt support for his diagnoses of schizoaffective disorder, depression, and anxiety.  Nursing notes, indicate that patient would only speak some times and look straight ahead upon arrival. Patient alert during the time of his behavioral health assessment. He is now able to answer questions appropriately. He however, appeared to be preoccupied at times and would laugh inappropriately when asked questions during the assessment. Patient denies current AVH's. He admits to history of paranoia, however; denies feeling paranoid at this time. He receives prolixin shots at home by home health. Sts he was last given prolixin this past Friday.      Axis I: Schizoaffective Disorder; Depressive Disorder, Anxiety Disorder, Cannibus Abuse.  Axis II: Deferred Axis III:  Past Medical History  Diagnosis Date  . Hypertension   . Depression   . Schizophrenia    Axis IV: other psychosocial or environmental problems and problems related to social environment Axis V: 31-40 impairment in reality testing  Past Medical History:  Past Medical History  Diagnosis Date  . Hypertension   . Depression   . Schizophrenia     No past surgical history on file.  Family History:  Family History  Problem Relation Age of Onset  . Allergies Mother   . Asthma Mother     Social History:  reports that he has been smoking Cigarettes.  He has a 8 pack-year smoking history. He does not have any smokeless tobacco history on file.  He reports that he uses illicit drugs (Marijuana) about 3 times per week. He reports that he does not drink alcohol.  Additional Social History:  Alcohol / Drug Use Pain Medications: see listed meds noted by ED staff Prescriptions: see listed meds noted by ED staff. Over the Counter: see listed meds noted by ED staff. History of alcohol / drug use?: Yes Longest period of sobriety (when/how long): n/a Substance #1 Name of Substance 1: THC 1 - Age of First Use: 17 1 - Amount (size/oz): varies  1 - Frequency: daily 1 - Duration: 3x per week 1 - Last Use / Amount: last night/ 09-15-2011 Allergies: No Known Allergies  Home Medications:  (Not in a hospital admission)  OB/GYN Status:  No LMP for male patient.  General Assessment Data Location of Assessment: [facility] ED ACT Assessment:  (No) Living Arrangements: Parent (lives with mother) Can pt return to current living arrangement?: Yes Admission Status: Voluntary Is patient capable of signing voluntary admission?: Yes Transfer from: Acute Hospital Referral Source: Self/Family/Friend     Risk to self Suicidal Ideation: Yes-Currently Present (passive on/off thoughts; no plan ) Suicidal Intent: No Is patient at risk for suicide?: No Suicidal Plan?: No Access to Means: No What has been your use of drugs/alcohol within the last 12 months?:  (THC) Previous Attempts/Gestures: Yes How many times?:  (1x-pt reports attempting to choke himself.) Other Self Harm Risks:  (n/a) Triggers for Past Attempts:  ("trying to stay clean"; "arguments w/ mother") Intentional Self Injurious Behavior: None Family Suicide History: No  Recent stressful life event(s): Conflict (Comment) (going to counseling for THC use) Persecutory voices/beliefs?: No Depression: Yes Substance abuse history and/or treatment for substance abuse?: No Suicide prevention information given to non-admitted patients: Not applicable  Risk to Others Homicidal Ideation:  No Thoughts of Harm to Others: No Current Homicidal Intent: No Current Homicidal Plan: No Access to Homicidal Means: No Identified Victim:  (n/a) History of harm to others?: No Assessment of Violence: None Noted Violent Behavior Description:  (Patient is calm and cooperative) Does patient have access to weapons?: No Criminal Charges Pending?: No Does patient have a court date: No  Psychosis Hallucinations: None noted Delusions: None noted  Mental Status Report Appear/Hygiene: Disheveled Eye Contact: Poor Motor Activity: Unremarkable Speech: Logical/coherent Level of Consciousness: Alert Mood: Anxious Affect: Appropriate to circumstance;Anxious;Preoccupied (often preoccupied during assessment by laughing) Anxiety Level: Moderate Thought Processes: Relevant;Circumstantial Judgement: Unimpaired Orientation: Person;Place;Time;Situation Obsessive Compulsive Thoughts/Behaviors: None  Cognitive Functioning Concentration: Decreased Memory: Recent Intact;Remote Impaired IQ: Average Insight: Fair Impulse Control: Fair Appetite: Good Weight Loss:  (n/a) Weight Gain:  (n/a) Sleep: Decreased Total Hours of Sleep:  (0) Vegetative Symptoms: None  Prior Inpatient Therapy Prior Inpatient Therapy: Yes Prior Therapy Dates:  (patient unable to recall previous dates) Prior Therapy Facilty/Provider(s):  (pt unable to recall facility or provider services were provi) Reason for Treatment:  ("I have schizoaffective disorder, no AVH's, paranoid")  Prior Outpatient Therapy Prior Outpatient Therapy: Yes Prior Therapy Dates:  (current) Prior Therapy Facilty/Provider(s):  ([facility]) Reason for Treatment:  (psychiatrist for med mgt-schizoaffective d/o; substance abus)  ADL Screening (condition at time of admission) Patient's cognitive ability adequate to safely complete daily activities?: Yes Patient able to express need for assistance with ADLs?: Yes Independently performs ADLs?:  Yes Communication: Independent Dressing (OT): Independent Grooming: Independent Feeding: Independent Bathing: Independent Toileting: Independent In/Out Bed: Independent Walks in Home: Independent Weakness of Legs: None Weakness of Arms/Hands: None  Home Assistive Devices/Equipment Home Assistive Devices/Equipment: None    Abuse/Neglect Assessment (Assessment to be complete while patient is alone) Physical Abuse: Denies Verbal Abuse: Denies Sexual Abuse: Denies Exploitation of patient/patient's resources: Denies Self-Neglect: Denies Values / Beliefs Cultural Requests During Hospitalization: None Spiritual Requests During Hospitalization: None   Advance Directives (For Healthcare) Advance Directive: Patient does not have advance directive Nutrition Screen Diet: Regular Unintentional weight loss greater than 10lbs within the last month: No Problems chewing or swallowing foods and/or liquids: No Home Tube Feeding or Total Parenteral Nutrition (TPN): No Patient appears severely malnourished: No  Additional Information 1:1 In Past 12 Months?: No CIRT Risk: No Elopement Risk: No Does patient have medical clearance?: No     Disposition:  Disposition Disposition of Patient:  (Disposition pending a telepsych consult)  Patient completed a telepsych consult today and in-pt treatment was recommended.    On Site Evaluation by:   Reviewed with Physician:     Melynda Ripple [facility] 09/16/2011 7:28 PM

## 2011-09-16 NOTE — ED Notes (Signed)
Received pt from Main ED, ambulatory, gait steady.  Pt in room urinated on stretcher and threw himself on floor.  Pt stood up sat in chair threw himself from chair to floor.  Comfort measures given.  Clean scrubs placed on pt.  All objects removed from room and stretcher removed from room for pts safety.  Padding in room for pt to lay on.  Charge Nurse Fleet Contras at bedside to speak with pt.  Geodon 20 mg given IM for agitation.  Pt states he wants to die.  Resting at present.

## 2011-09-16 NOTE — ED Notes (Signed)
Pt. Came to RN window, c/o of anixity, can't sleep., prn medication given.

## 2011-09-16 NOTE — ED Notes (Signed)
Pt. Informed why his bed frame removed from room, pt. Has walked to btrm and used it x2 this am.  Pt. Requested bed back, agreed to use btrm and not urinate in room.  Informed pt. That if he does, bed will once again removed.  Pt. Verbalized understanding.

## 2011-09-16 NOTE — BH Assessment (Signed)
Writer unable to assess as pt too drowsy. He was given Geodon 20 mg. Writer will assess later this am.

## 2011-09-16 NOTE — BH Assessment (Signed)
1st opinion completed and faxed along with IVC paperwork to magistrate.

## 2011-09-16 NOTE — ED Notes (Signed)
Pt information has been sent to [facility] for review. Pt is currently pending disposition. Oncoming CSW/ACT to follow up to determine disposition.

## 2011-09-16 NOTE — ED Provider Notes (Signed)
Patient seen by telepsych. Recommends inpatient admission given psychosis. Recommends IVC. Recommended when necessary Geodon and Ativan. We'll initiate these recommendations, and work on placement  Olivia Mackie, MD 09/16/11 [phone]

## 2011-09-17 ENCOUNTER — Encounter (HOSPITAL_COMMUNITY): Payer: Self-pay | Admitting: Emergency Medicine

## 2011-09-17 MED ORDER — DIPHENOXYLATE-ATROPINE 2.5-0.025 MG PO TABS: 1.0000 | ORAL_TABLET | Freq: Four times a day (QID) | ORAL | Status: DC | PRN

## 2011-09-17 MED ORDER — WITCH HAZEL-GLYCERIN EX PADS
MEDICATED_PAD | CUTANEOUS | Status: DC | PRN
  Filled 2011-09-17: qty 100

## 2011-09-17 NOTE — ED Provider Notes (Signed)
He has been accepted at [facility].   Flint Melter, MD 09/17/11 [phone]

## 2011-09-17 NOTE — Discharge Planning (Signed)
Patient has been accepted to [facility], Dr. Betti Cruz. Nurse to call report to [phone].  Pt will be transported by [facility] to [facility]. Pt's nurse and EDP notified.  Manson Passey Vida Nicol ANN S , MSW, LCSWA 09/17/2011 7:45 AM [phone]

## 2011-09-17 NOTE — ED Notes (Signed)
Report given to [place] at [facility].

## 2011-09-18 DIAGNOSIS — F29 Unspecified psychosis not due to a substance or known physiological condition: Secondary | ICD-10-CM | POA: Diagnosis not present

## 2011-09-19 DIAGNOSIS — F29 Unspecified psychosis not due to a substance or known physiological condition: Secondary | ICD-10-CM | POA: Diagnosis not present

## 2011-09-20 DIAGNOSIS — F29 Unspecified psychosis not due to a substance or known physiological condition: Secondary | ICD-10-CM | POA: Diagnosis not present

## 2011-09-25 DIAGNOSIS — F29 Unspecified psychosis not due to a substance or known physiological condition: Secondary | ICD-10-CM | POA: Diagnosis not present

## 2011-10-07 DIAGNOSIS — K219 Gastro-esophageal reflux disease without esophagitis: Secondary | ICD-10-CM | POA: Diagnosis not present

## 2012-05-26 DIAGNOSIS — F259 Schizoaffective disorder, unspecified: Secondary | ICD-10-CM | POA: Diagnosis not present

## 2012-08-21 ENCOUNTER — Encounter (HOSPITAL_COMMUNITY): Payer: Self-pay | Admitting: Emergency Medicine

## 2012-08-21 ENCOUNTER — Emergency Department (HOSPITAL_COMMUNITY)
Admission: EM | Admit: 2012-08-21 | Discharge: 2012-08-22 | Disposition: A | Discharge status: Other Acute Inpatient Hospital | Payer: Medicare Other | Source: Home / Self Care | Attending: Emergency Medicine | Admitting: Emergency Medicine

## 2012-08-21 DIAGNOSIS — F251 Schizoaffective disorder, depressive type: Secondary | ICD-10-CM

## 2012-08-21 DIAGNOSIS — F259 Schizoaffective disorder, unspecified: Secondary | ICD-10-CM | POA: Diagnosis not present

## 2012-08-21 LAB — CBC WITH DIFFERENTIAL/PLATELET
Lymphocytes Relative: 22 % (ref 12–46)
Lymphs Abs: 2.2 10*3/uL (ref 0.7–4.0)
MCV: 81.9 fL (ref 78.0–100.0)
Neutro Abs: 6.9 10*3/uL (ref 1.7–7.7)
Neutrophils Relative %: 68 % (ref 43–77)
Platelets: 286 10*3/uL (ref 150–400)
RBC: 5.52 MIL/uL (ref 4.22–5.81)
WBC: 10.1 10*3/uL (ref 4.0–10.5)

## 2012-08-21 LAB — COMPREHENSIVE METABOLIC PANEL
ALT: 32 U/L (ref 0–53)
Alkaline Phosphatase: 85 U/L (ref 39–117)
CO2: 24 mEq/L (ref 19–32)
Chloride: 102 mEq/L (ref 96–112)
GFR calc Af Amer: >90 mL/min (ref >90)
GFR calc non Af Amer: >90 mL/min (ref >90)
Glucose, Bld: 145 mg/dL — ABNORMAL HIGH (ref 70–99)
Potassium: 3.9 mEq/L (ref 3.5–5.1)
Sodium: 137 mEq/L (ref 135–145)

## 2012-08-21 MED ORDER — ESCITALOPRAM OXALATE 10 MG PO TABS
20.0000 mg | ORAL_TABLET | Freq: Every day | ORAL | Status: DC
  Administered 2012-08-21 – 2012-08-22 (×2): 20 mg via ORAL
  Filled 2012-08-21 (×2): qty 1
  Filled 2012-08-21: qty 2

## 2012-08-21 MED ORDER — IBUPROFEN 600 MG PO TABS: 600.0000 mg | ORAL_TABLET | Freq: Three times a day (TID) | ORAL | Status: DC | PRN

## 2012-08-21 MED ORDER — PALIPERIDONE ER 6 MG PO TB24
6.0000 mg | ORAL_TABLET | Freq: Every day | ORAL | Status: DC
  Administered 2012-08-22: 6 mg via ORAL
  Filled 2012-08-21 (×2): qty 1

## 2012-08-21 MED ORDER — LORAZEPAM 1 MG PO TABS: 2.0000 mg | ORAL_TABLET | Freq: Three times a day (TID) | ORAL | Status: DC | PRN

## 2012-08-21 MED ORDER — SERTRALINE HCL 50 MG PO TABS
100.0000 mg | ORAL_TABLET | Freq: Every day | ORAL | Status: DC
  Administered 2012-08-22: 100 mg via ORAL
  Filled 2012-08-21: qty 2

## 2012-08-21 MED ORDER — PALIPERIDONE ER 3 MG PO TB24
3.0000 mg | ORAL_TABLET | Freq: Every evening | ORAL | Status: DC
  Administered 2012-08-21: 3 mg via ORAL
  Filled 2012-08-21: qty 1

## 2012-08-21 MED ORDER — DIPHENHYDRAMINE HCL 25 MG PO CAPS: 50.0000 mg | ORAL_CAPSULE | Freq: Every evening | ORAL | Status: DC

## 2012-08-21 MED ORDER — ACETAMINOPHEN 325 MG PO TABS: 650.0000 mg | ORAL_TABLET | ORAL | Status: DC | PRN

## 2012-08-21 MED ORDER — LORAZEPAM 1 MG PO TABS: 1.0000 mg | ORAL_TABLET | Freq: Three times a day (TID) | ORAL | Status: DC | PRN
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
## 2012-08-21 NOTE — ED Provider Notes (Signed)
Telepsych, Dr. Trisha Mangle, talked to patient. Patient psychotic and needs inpatient admission. Agitated currently, will give geodon.   Richardean Canal, MD 08/21/12 [phone]

## 2012-08-21 NOTE — ED Notes (Signed)
Telepsych in progress. 

## 2012-08-22 ENCOUNTER — Encounter (HOSPITAL_COMMUNITY): Payer: Self-pay | Admitting: *Deleted

## 2012-08-22 ENCOUNTER — Inpatient Hospital Stay (HOSPITAL_COMMUNITY)
Admission: AD | Admit: 2012-08-22 | Discharge: 2012-08-25 | DRG: 885 | Disposition: A | Discharge status: Home / Self Care | Payer: Medicare Other | Source: Intra-hospital | Attending: Psychiatry | Admitting: Psychiatry

## 2012-08-22 DIAGNOSIS — I10 Essential (primary) hypertension: Secondary | ICD-10-CM | POA: Diagnosis present

## 2012-08-22 DIAGNOSIS — Z79899 Other long term (current) drug therapy: Secondary | ICD-10-CM

## 2012-08-22 DIAGNOSIS — F1910 Other psychoactive substance abuse, uncomplicated: Secondary | ICD-10-CM

## 2012-08-22 DIAGNOSIS — F251 Schizoaffective disorder, depressive type: Secondary | ICD-10-CM | POA: Diagnosis present

## 2012-08-22 DIAGNOSIS — F259 Schizoaffective disorder, unspecified: Principal | ICD-10-CM | POA: Diagnosis present

## 2012-08-22 LAB — RAPID URINE DRUG SCREEN, HOSP PERFORMED
Barbiturates: NOT DETECTED
Tetrahydrocannabinol: POSITIVE — AB

## 2012-08-22 MED ORDER — MAGNESIUM HYDROXIDE 400 MG/5ML PO SUSP: 30.0000 mL | Freq: Every day | ORAL | Status: DC | PRN

## 2012-08-22 MED ORDER — PALIPERIDONE ER 3 MG PO TB24
3.0000 mg | ORAL_TABLET | Freq: Every evening | ORAL | Status: DC
  Administered 2012-08-22 – 2012-08-25 (×4): 3 mg via ORAL
  Filled 2012-08-22 (×6): qty 1

## 2012-08-22 MED ORDER — ALUM & MAG HYDROXIDE-SIMETH 200-200-20 MG/5ML PO SUSP
30.0000 mL | ORAL | Status: DC | PRN
  Administered 2012-08-23 – 2012-08-24 (×2): 30 mL via ORAL

## 2012-08-22 MED ORDER — ACETAMINOPHEN 325 MG PO TABS
650.0000 mg | ORAL_TABLET | Freq: Four times a day (QID) | ORAL | Status: DC | PRN
  Administered 2012-08-24: 650 mg via ORAL

## 2012-08-22 MED ORDER — ESCITALOPRAM OXALATE 20 MG PO TABS
20.0000 mg | ORAL_TABLET | Freq: Every day | ORAL | Status: DC
  Administered 2012-08-22 – 2012-08-25 (×4): 20 mg via ORAL
  Filled 2012-08-22 (×7): qty 1
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
## 2012-08-22 NOTE — ED Notes (Signed)
Called report to [facility] and spoke with charge rn.

## 2012-08-22 NOTE — Progress Notes (Signed)
Patient accepted by Assunta Found, NP to Dr. Jannifer Franklin. Rosey Bath, RN

## 2012-08-22 NOTE — ED Notes (Signed)
rn called [employer] for transport.
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
## 2012-08-22 NOTE — ED Provider Notes (Addendum)
Pt resting, nad. Vitals normal. telepsych consult had recommended inpt psych tx.  Discussed w act team, placement pending.   Suzi Roots, MD 08/22/12 [phone]  Act team indicates pt accepted to [facility], Dr Jannifer Franklin, bed ready  Suzi Roots, MD 08/22/12 1236

## 2012-08-22 NOTE — ED Notes (Signed)
Pt was well behaved and ambulatory. He was cooperative/polite. [facility] picked up pt to transport to [facility]. He had 2 white plastic bags of belongings.

## 2012-08-23 DIAGNOSIS — F259 Schizoaffective disorder, unspecified: Secondary | ICD-10-CM | POA: Diagnosis not present

## 2012-08-23 NOTE — [facility] Group Notes (Signed)
[facility] LCSW Group Therapy  08/23/2012   Did not attend  Sarina Ser 08/23/2012, 12:38 PM
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
## 2012-08-23 NOTE — Progress Notes (Signed)
Psychoeducational Group Note  Date:  08/23/2012 Time:  0945 am  Group Topic/Focus:  Making Healthy Choices:   The focus of this group is to help patients identify negative/unhealthy choices they were using prior to admission and identify positive/healthier coping strategies to replace them upon discharge.  Participation Level:  Did Not Attend   Andrena Mews 08/23/2012, 10:29 AM

## 2012-08-23 NOTE — Progress Notes (Signed)
Patient stayed in his room in bed asleep during group time.

## 2012-08-23 NOTE — Progress Notes (Signed)
Patient ID: [name], male   DOB: [birth year], 27 y.o.   MRN: [id number] D. The patient remained resting in bed all evening. Offered no complaints. Forwarded little information. A. Met with patient in his room. Encouraged to attend evening group. R. The patient did not attend evening group. Stayed in his room in bed all evening. Denied A/V hallucinations.

## 2012-08-23 NOTE — BHH Suicide Risk Assessment (Signed)
Suicide Risk Assessment  Admission Assessment     Nursing information obtained from:    Demographic factors:   male single Current Mental Status:   no si, no hi, no avh, logial Loss Factors:   conflict  With mother Historical Factors:   hx of aggressive behavior, use cannabis Risk Reduction Factors:    working CLINICAL FACTORS:   Previous Psychiatric Diagnoses and Treatments  COGNITIVE FEATURES THAT CONTRIBUTE TO RISK:  Closed-mindedness    SUICIDE RISK:   Mild:  Suicidal ideation of limited frequency, intensity, duration, and specificity.  There are no identifiable plans, no associated intent, mild dysphoria and related symptoms, good self-control (both objective and subjective assessment), few other risk factors, and identifiable protective factors, including available and accessible social support.  PLAN OF CARE: Continue current meds I certify that inpatient services furnished can reasonably be expected to improve the patient's condition.  Wonda Cerise 08/23/2012, 12:25 PM

## 2012-08-24 DIAGNOSIS — F1910 Other psychoactive substance abuse, uncomplicated: Secondary | ICD-10-CM

## 2012-08-24 DIAGNOSIS — F259 Schizoaffective disorder, unspecified: Principal | ICD-10-CM

## 2012-08-24 MED ORDER — NICOTINE 14 MG/24HR TD PT24
14.0000 mg | MEDICATED_PATCH | Freq: Every day | TRANSDERMAL | Status: DC
  Administered 2012-08-24: 14 mg via TRANSDERMAL
  Filled 2012-08-24 (×5): qty 1
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
## 2012-08-24 NOTE — Progress Notes (Signed)
Recreation Therapy Notes  Date: 04.07.2014  Time: 9:30am  Location: [address] Day Room   Group Topic/Focus: Memory   Participation Level:  Active   Participation Quality:  Appropriate   Affect:  Euthymic   Cognitive:  Appropriate   Additional Comments: Patient completed a series of stretches demonstrated by LRT. Patient participated in memory game. Patient with peers and LRT developed sequence of actions to be completed. Sequence included: pat head x2, pat chest in "X" pattern x4, pat leg, snap fingers, bump right hand on top of left hand, hands over head in sweeping motion towards back, squat, kick left leg front, kick left leg back. Patient actively participated in group activity. Patient interacted appropriately with peers and LRT.  Patient became fatigued approximately 5 minutes before group session ended. Patient participated from a seated position for remainder of group session.   Marykay Lex Garan Frappier, LRT/CTRS   Shuan Statzer L 08/24/2012 1:06 PM
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
## 2012-08-24 NOTE — Progress Notes (Signed)
D:  Per pt self inventory pt reports sleeping well, appetite good, energy level high, ability to pay attention improving, rates depression at a 2 out of 10 and hopelessness at a 2 out of 10.  Denies SI/HI/AVH at this time.  Denies pain.   A:  Emotional support provided, Encouraged pt to continue with treatment plan and attend all group activities, q15 min checks maintained for safety.  R:  Pt did not attend am group, "wanted to sleep", calm and cooperative with staff and peers otherwise.
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
## 2012-08-24 NOTE — Progress Notes (Signed)
Pt observed in his room in bed awake.  He reports he is doing ok this evening.  He denies SI/HI/AV to this Clinical research associate.  He is unsure when he will be discharged, but he plans to return to his mother's home.  He voiced no needs or concerns.  Pt was encouraged to make his needs known to staff.  He was encouraged to go to evening group, but chose to stay in bed.  Support and encouragement offered.  Safety maintained with q15 minute checks.

## 2012-08-24 NOTE — Progress Notes (Signed)
Patient ID: [name], male   DOB: Jun 07, 1985, 27 y.o.   MRN: [id number] D: Patient lying in bed. Respirations even and non-labored. A: Staff will check q 15 minutes, follow treatment plan, and give meds as ordered. R: Appears to be sleeping.

## 2012-08-24 NOTE — BHH Group Notes (Signed)
[facility] LCSW Aftercare Discharge Planning Group Note   08/24/2012 3:41 PM  Participation Quality:  Engaged  Mood/Affect:  Appropriate  Depression Rating:  unknown  Anxiety Rating:  unknown  Thoughts of Suicide:  No Will you contract for safety?   NA  Current AVH:  No  Plan for Discharge/Comments:  Plans to return home, follow up with ACT team  Transportation Means: unknown  Supports: Mother, ACT team  Ida Rogue
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
## 2012-08-24 NOTE — Progress Notes (Signed)
Psychoeducational Group Note  Date:  08/24/2012 Time:  2000  Group Topic/Focus:  Wrap-Up Group:   The focus of this group is to help patients review their daily goal of treatment and discuss progress on daily workbooks.  Participation Level: Did Not Attend  Participation Quality:  Not Applicable  Affect:  Not Applicable  Cognitive:  Not Applicable  Insight:  Not Applicable  Engagement in Group: Not Applicable  Additional Comments:  Pt did not attend  Christ Kick 08/24/2012, 9:22 PM
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
## 2012-08-25 DIAGNOSIS — F259 Schizoaffective disorder, unspecified: Secondary | ICD-10-CM | POA: Diagnosis not present

## 2012-08-25 MED ORDER — PALIPERIDONE ER 3 MG PO TB24: 3.0000 mg | ORAL_TABLET | Freq: Every evening | ORAL | Status: DC

## 2012-08-25 MED ORDER — ESCITALOPRAM OXALATE 20 MG PO TABS: 20.0000 mg | ORAL_TABLET | Freq: Every day | ORAL | Status: DC

## 2012-08-25 MED ORDER — PALIPERIDONE PALMITATE 234 MG/1.5ML IM SUSP: 234.0000 mg | INTRAMUSCULAR | Status: DC

## 2012-08-25 NOTE — Progress Notes (Signed)
Pt denies SI/HI/AVH. Pt denies pain and show no s/s of distress. Pt has minimal interaction on unit today. Pt reported that his ride will be here at 1800 to pick him up. Per pt, he feels ready to be discharged.  Medications administered as ordered per MD. Verbal support given. Pt encouraged to attend groups. 15 minute checks performed for safety.  Pt is receptive to treatment.

## 2012-08-25 NOTE — Progress Notes (Signed)
[facility] Adult Case Management Discharge Plan :  Will you be returning to the same living situation after discharge: Yes,  home At discharge, do you have transportation home?:Yes,  mother Do you have the ability to pay for your medications:Yes,  MCD  Release of information consent forms completed and in the chart;  Patient's signature needed at discharge.  Patient to Follow up at: Follow-up Information   Follow up with [facility] On 08/27/2012. (10:30AM with your mother at the office)    Contact information:   [address]  [place]  [336] [phone]      Patient denies SI/HI:   Yes,  yes    Safety Planning and Suicide Prevention discussed:  Yes,  yes  Ida Rogue 08/25/2012, 4:43 PM

## 2012-08-25 NOTE — Care Management Utilization Note (Signed)
PER STATE REGULATIONS 482.30  THIS CHART WAS REVIEWED FOR MEDICAL NECESSITY WITH RESPECT TO THE PATIENT'S ADMISSION/ DURATION OF STAY.  NEXT REVIEW DATE: 08/28/12

## 2012-08-25 NOTE — [facility] Suicide Risk Assessment (Signed)
[facility] INPATIENT:  Family/Significant Other Suicide Prevention Education  Suicide Prevention Education:  Education Completed;No one has been identified by the patient as the family member/significant other with whom the patient will be residing, and identified as the person(s) who will aid the patient in the event of a mental health crisis (suicidal ideations/suicide attempt).  With written consent from the patient, the family member/significant other has been provided the following suicide prevention education, prior to the and/or following the discharge of the patient.  The suicide prevention education provided includes the following:  Suicide risk factors  Suicide prevention and interventions  National Suicide Hotline telephone number  [facility] assessment telephone number  [facility] Emergency Assistance 911  [facility] and/or Residential Mobile Crisis Unit telephone number  Request made of family/significant other to:  Remove weapons (e.g., guns, rifles, knives), all items previously/currently identified as safety concern.    Remove drugs/medications (over-the-counter, prescriptions, illicit drugs), all items previously/currently identified as a safety concern.  The family member/significant other verbalizes understanding of the suicide prevention education information provided.  The family member/significant other agrees to remove the items of safety concern listed above. Amedio denied SI previous to admission, nor did he endorse SI during his stay with [place].  No SPE required  Ida Rogue 08/25/2012, 10:34 AM

## 2012-08-25 NOTE — Progress Notes (Signed)
Discharge note: pt discharged to self. Pt picked up by his mother. Pt received both written and verbal  discharge instructions. Pt agreed to f/u appointment and med regimen. Pt denies SI/HI/AVH. Pt received all belongings from locker and room. Pt receptive to aftercare treatment.

## 2012-08-25 NOTE — [facility] Suicide Risk Assessment (Signed)
Suicide Risk Assessment  Discharge Assessment     Demographic Factors:  Male, Low socioeconomic status and Unemployed  Mental Status Per Nursing Assessment::   On Admission:     Current Mental Status by Physician: patient denies suicidal, ideation, intent or plan.  Loss Factors: Decrease in vocational status and Financial problems/change in socioeconomic status  Historical Factors: Impulsivity  Risk Reduction Factors:   Positive social support  Continued Clinical Symptoms:  Alcohol/Substance Abuse/Dependencies Previous Psychiatric Diagnoses and Treatments  Cognitive Features That Contribute To Risk:  Closed-mindedness Polarized thinking    Suicide Risk:  Minimal: No identifiable suicidal ideation.  Patients presenting with no risk factors but with morbid ruminations; may be classified as minimal risk based on the severity of the depressive symptoms  Discharge Diagnoses:   AXIS I:  Schizoaffective disorder, depressive type. Polysubstance   AXIS II:  Deferred AXIS III:   Past Medical History  Diagnosis Date  . Hypertension   . Depression   . Schizophrenia    AXIS IV:  economic problems, other psychosocial or environmental problems and problems related to social environment AXIS V:  61-70 mild symptoms  Plan Of Care/Follow-up recommendations:  Activity:  as tolerated Diet:  healthy Tests:  routine Other:  patient to keep his after care appointment.  Is patient on multiple antipsychotic therapies at discharge:  No   Has Patient had three or more failed trials of antipsychotic monotherapy by history:  No  Recommended Plan for Multiple Antipsychotic Therapies: N/A  Arvis Miguez,MD 08/25/2012, 10:46 AM
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
## 2012-08-26 NOTE — Discharge Summary (Signed)
Seen and agreed. Rufino Staup, MD 

## 2012-08-28 NOTE — Progress Notes (Signed)
Patient Discharge Instructions:  After Visit Summary (AVS):   Faxed to:  08/28/12 Discharge Summary Note:   Faxed to:  08/28/12 Psychiatric Admission Assessment Note:   Faxed to:  08/28/12 Suicide Risk Assessment - Discharge Assessment:   Faxed to:  08/28/12 Faxed/Sent to the Next Level Care provider:  08/28/12 Faxed to [facility] @ [phone]  Jerelene Redden, 08/28/2012, 4:13 PM

## 2012-11-19 DIAGNOSIS — R05 Cough: Secondary | ICD-10-CM | POA: Diagnosis not present

## 2012-11-19 DIAGNOSIS — J069 Acute upper respiratory infection, unspecified: Secondary | ICD-10-CM | POA: Diagnosis not present

## 2012-11-19 DIAGNOSIS — R059 Cough, unspecified: Secondary | ICD-10-CM | POA: Diagnosis not present

## 2013-01-07 DIAGNOSIS — Z79899 Other long term (current) drug therapy: Secondary | ICD-10-CM | POA: Diagnosis not present

## 2013-03-02 DIAGNOSIS — Z79899 Other long term (current) drug therapy: Secondary | ICD-10-CM | POA: Diagnosis not present

## 2013-03-16 DIAGNOSIS — Z79899 Other long term (current) drug therapy: Secondary | ICD-10-CM | POA: Diagnosis not present

## 2013-03-30 DIAGNOSIS — Z79899 Other long term (current) drug therapy: Secondary | ICD-10-CM | POA: Diagnosis not present

## 2013-04-20 DIAGNOSIS — Z5181 Encounter for therapeutic drug level monitoring: Secondary | ICD-10-CM | POA: Diagnosis not present

## 2013-04-27 DIAGNOSIS — Z5181 Encounter for therapeutic drug level monitoring: Secondary | ICD-10-CM | POA: Diagnosis not present

## 2013-05-04 ENCOUNTER — Encounter (HOSPITAL_COMMUNITY): Payer: Self-pay | Admitting: Emergency Medicine

## 2013-05-04 ENCOUNTER — Emergency Department (HOSPITAL_COMMUNITY)
Admission: EM | Admit: 2013-05-04 | Discharge: 2013-05-04 | Disposition: A | Discharge status: Home / Self Care | Payer: Medicare Other | Attending: Emergency Medicine | Admitting: Emergency Medicine

## 2013-05-04 DIAGNOSIS — F17200 Nicotine dependence, unspecified, uncomplicated: Secondary | ICD-10-CM | POA: Insufficient documentation

## 2013-05-04 DIAGNOSIS — F911 Conduct disorder, childhood-onset type: Secondary | ICD-10-CM | POA: Diagnosis not present

## 2013-05-04 DIAGNOSIS — F209 Schizophrenia, unspecified: Secondary | ICD-10-CM | POA: Insufficient documentation

## 2013-05-04 DIAGNOSIS — F3289 Other specified depressive episodes: Secondary | ICD-10-CM | POA: Insufficient documentation

## 2013-05-04 DIAGNOSIS — R4689 Other symptoms and signs involving appearance and behavior: Secondary | ICD-10-CM

## 2013-05-04 DIAGNOSIS — F39 Unspecified mood [affective] disorder: Secondary | ICD-10-CM | POA: Diagnosis not present

## 2013-05-04 DIAGNOSIS — I10 Essential (primary) hypertension: Secondary | ICD-10-CM | POA: Diagnosis not present

## 2013-05-04 DIAGNOSIS — F259 Schizoaffective disorder, unspecified: Secondary | ICD-10-CM | POA: Diagnosis not present

## 2013-05-04 DIAGNOSIS — F329 Major depressive disorder, single episode, unspecified: Secondary | ICD-10-CM | POA: Insufficient documentation

## 2013-05-04 DIAGNOSIS — F251 Schizoaffective disorder, depressive type: Secondary | ICD-10-CM

## 2013-05-04 DIAGNOSIS — Z79899 Other long term (current) drug therapy: Secondary | ICD-10-CM | POA: Diagnosis not present

## 2013-05-04 LAB — CBC WITH DIFFERENTIAL/PLATELET
Basophils Absolute: 0 10*3/uL (ref 0.0–0.1)
Basophils Relative: 0 % (ref 0–1)
HCT: 46.2 % (ref 39.0–52.0)
Hemoglobin: 16.3 g/dL (ref 13.0–17.0)
Lymphocytes Relative: 25 % (ref 12–46)
MCHC: 35.3 g/dL (ref 30.0–36.0)
Monocytes Absolute: 0.7 10*3/uL (ref 0.1–1.0)
Neutro Abs: 5.2 10*3/uL (ref 1.7–7.7)
Neutrophils Relative %: 65 % (ref 43–77)
RDW: 12.3 % (ref 11.5–15.5)
WBC: 8.1 10*3/uL (ref 4.0–10.5)

## 2013-05-04 LAB — RAPID URINE DRUG SCREEN, HOSP PERFORMED
Barbiturates: NOT DETECTED
Cocaine: NOT DETECTED
Tetrahydrocannabinol: POSITIVE — AB

## 2013-05-04 LAB — COMPREHENSIVE METABOLIC PANEL
ALT: 45 U/L (ref 0–53)
AST: 29 U/L (ref 0–37)
Albumin: 3.9 g/dL (ref 3.5–5.2)
Alkaline Phosphatase: 93 U/L (ref 39–117)
CO2: 21 mEq/L (ref 19–32)
Chloride: 97 mEq/L (ref 96–112)
GFR calc non Af Amer: >90 mL/min (ref >90)
Potassium: 4 mEq/L (ref 3.5–5.1)
Total Bilirubin: 0.4 mg/dL (ref 0.3–1.2)

## 2013-05-04 LAB — ETHANOL: Alcohol, Ethyl (B): <11 mg/dL (ref 0–11)

## 2013-05-04 NOTE — Progress Notes (Signed)
Per, Dr. Ladona Ridgel and the NP [facility] the patient does not meet criteria for inpatient hospitalization.  Writer informed the ACT representative (Nia)   Dr. Ladona Ridgel rescinded the patients IVC.    Writer faxed the IVC to [facility].

## 2013-05-04 NOTE — ED Notes (Signed)
Patient observed resting in bed. Denies SI, HI, AVH. Contracts for safety. Denies feelings of anxiety and depression. Reports that he is tired. States that he has had some sensation of falling or being off balance and attributes these feelings to [facility]. Patient safety maintained, Q 15 checks continue.
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
## 2013-05-04 NOTE — Progress Notes (Signed)
Writer informed the extenders (Dr. Ladona Ridgel and Denice Bors, NP) of the consult request.

## 2013-05-04 NOTE — ED Notes (Signed)
Patient arrived to unit; no s/s of distress noted. Pt pleasant and cooperative with staff. Pt denies SI/HI or plans to harm himself. Pt states he is having problems with anger and agitation, but hasn't hurt anyone.

## 2013-05-04 NOTE — Progress Notes (Signed)
The patient has an ACT Team with [facility].

## 2013-05-04 NOTE — ED Provider Notes (Signed)
CSN: [id number]     Arrival date & time 05/04/13  1048 History   First MD Initiated Contact with Patient 05/04/13 1105     Chief Complaint  Patient presents with  . Medical Clearance   (Consider location/radiation/quality/duration/timing/severity/associated sxs/prior Treatment) The history is provided by the patient. No language interpreter was used.   patient is a 27 year old who is brought in by [employer]. He is accompanied by his mother who he lives with. He reports that he is having problems with anger and aggression. He is frustrated by what is causing this and feels like he needs some help. He reports that he gets an injection once a month. He has a history of hypertension, depression and schizophrenia. He has had IVC's in the past. He denies any homicidal or suicidal ideation at this time. Patient denies auditory or visual hallucinations. He denies any recent illness and is not having any any difficulty breathing, chest pain or abdominal pain. He reports that he has not had a fever or any recent sick exposure. He reports that he is feeling fine physically except for occasional back pain and cramping in his legs for which he has ongoing problems with. He reports that he currently does not drink any alcohol and denies use of other drug use except for occasional marijuana usage. He is very cooperative at today's visit and reports that he wants to get help.  Past Medical History  Diagnosis Date  . Hypertension   . Depression   . Schizophrenia    No past surgical history on file. Family History  Problem Relation Age of Onset  . Allergies Mother   . Asthma Mother    History  Substance Use Topics  . Smoking status: Current Every Day Smoker -- 1.00 packs/day for 8 years    Types: Cigarettes  . Smokeless tobacco: Not on file  . Alcohol Use: No    Review of Systems  Constitutional: Negative for fever and chills.  Respiratory: Negative for shortness of breath.   Cardiovascular: Negative for  chest pain.  Gastrointestinal: Negative for abdominal pain.  Genitourinary: Negative for dysuria.  Neurological: Negative for syncope and weakness.  Psychiatric/Behavioral: Negative for suicidal ideas, hallucinations, behavioral problems, confusion, decreased concentration and agitation. The patient is not nervous/anxious and is not hyperactive.   All other systems reviewed and are negative.    Allergies  Review of patient's allergies indicates no known allergies.  Home Medications   Current Outpatient Rx  Name  Route  Sig  Dispense  Refill  . escitalopram (LEXAPRO) 20 MG tablet   Oral   Take 1 tablet (20 mg total) by mouth daily. For depression and anxiety.   30 tablet   0   . paliperidone (INVEGA) 3 MG 24 hr tablet   Oral   Take 1 tablet (3 mg total) by mouth every evening. For psychosis   30 tablet   0   . Paliperidone Palmitate 234 MG/1.5ML SUSP   Intramuscular   Inject 234 mg into the muscle every 28 (twenty-eight) days. For schizoaffective disorder.   0.9 mL       There were no vitals taken for this visit. Physical Exam  Nursing note and vitals reviewed. Constitutional: He appears well-developed and well-nourished. No distress.  HENT:  Head: Normocephalic.  Mouth/Throat: Oropharynx is clear and moist.  Eyes: Conjunctivae and EOM are normal.  Neck: Normal range of motion. Neck supple. No JVD present. No tracheal deviation present. No thyromegaly present.  Cardiovascular: Normal rate, regular  rhythm, normal heart sounds and intact distal pulses.   Pulmonary/Chest: Effort normal and breath sounds normal. No respiratory distress. He has no wheezes. He has no rales.  Abdominal: Soft. Bowel sounds are normal. He exhibits no distension. There is no tenderness.  Musculoskeletal: Normal range of motion.  Lymphadenopathy:    He has no cervical adenopathy.  Neurological: He is alert.  Skin: Skin is warm and dry.  Psychiatric: He has a normal mood and affect. His speech  is normal and behavior is normal. Judgment and thought content normal. Cognition and memory are normal.    ED Course  Procedures (including critical care time) Labs Review Labs Reviewed  CBC WITH DIFFERENTIAL  COMPREHENSIVE METABOLIC PANEL  URINE RAPID DRUG SCREEN (HOSP PERFORMED)  ETHANOL   Imaging Review No results found.  EKG Interpretation   None       MDM   1. Aggression    Moved to psych area of ER. Psychiatry to evaluate and dispo. Pt is a IVC at this time and is cooperative. Afebrile, well-appearing.  VS stable. Labs pending.      Irish Elders, NP 05/04/13 [phone]

## 2013-05-04 NOTE — Consult Note (Signed)
[facility] Face-to-Face Psychiatry Consult   Reason for Consult:  Anger outburst Referring Physician:  EDP  [name] is an 27 y.o. male.  Assessment: AXIS I:  Mood Disorder NOS and Schizoaffective Disorder AXIS II:  Deferred AXIS III:   Past Medical History  Diagnosis Date  . Hypertension   . Depression   . Schizophrenia    AXIS IV:  other psychosocial or environmental problems and problems related to social environment AXIS V:  51-60 moderate symptoms  Plan:  No evidence of imminent risk to self or others at present.   Patient does not meet criteria for psychiatric inpatient admission. Supportive therapy provided about ongoing stressors. Discussed crisis plan, support from social network, calling 911, coming to the Emergency Department, and calling Suicide Hotline.  Subjective:   [name] is a 27 y.o. male.  HPI:  Patient states "I am here so that I can get some rest and information on how to take my life back and get information on resources."  Patient denies suicidal/homicidal ideation, psychosis, and paranoia.  Patient states that he has a history of schizophrenia but is taking his medication and doing outpatient services for substance abuse.  Patient states that he gets upset because he does feel that he has control of his life and needs to a different therapy other than substance abuse.  Patient ACT Team at bedside and states that patient is compliant with out patient treatment and medication. States that she will assist in patient changing type of therapy.   Past Psychiatric History: Past Medical History  Diagnosis Date  . Hypertension   . Depression   . Schizophrenia     reports that he has been smoking Cigarettes.  He has a 8 pack-year smoking history. He does not have any smokeless tobacco history on file. He reports that he uses illicit drugs (Marijuana) about 3 times per week. He reports that he does not drink alcohol. Family History  Problem Relation Age of  Onset  . Allergies Mother   . Asthma Mother            Allergies:  No Known Allergies  ACT Assessment Complete:  No:   Past Psychiatric History: Diagnosis:  Mood disorder, Schizoaffective disorder  Hospitalizations:  Past history  Outpatient Care:  Yes  Substance Abuse Care:  Past history  Self-Mutilation:  Denies  Suicidal Attempts:    Homicidal Behaviors:  Denies   Violent Behaviors:  Denies   Place of Residence:  [place] Marital Status:  Single Employed/Unemployed:  Unemployed Education:   Family Supports:  yes Objective: Blood pressure 143/90, pulse 103, temperature 98.1 F (36.7 C), temperature source Oral, resp. rate 19, SpO2 94.00%.There is no weight on file to calculate BMI. Results for orders placed during the hospital encounter of 05/04/13 (from the past 72 hour(s))  CBC WITH DIFFERENTIAL     Status: None   Collection Time    05/04/13 11:10 AM      Result Value Range   WBC 8.1  4.0 - 10.5 K/uL   RBC 5.58  4.22 - 5.81 MIL/uL   Hemoglobin 16.3  13.0 - 17.0 g/dL   HCT 16.1  09.6 - 04.5 %   MCV 82.8  78.0 - 100.0 fL   MCH 29.2  26.0 - 34.0 pg   MCHC 35.3  30.0 - 36.0 g/dL   RDW 40.9  81.1 - 91.4 %   Platelets 279  150 - 400 K/uL   Neutrophils Relative % 65  43 -  77 %   Neutro Abs 5.2  1.7 - 7.7 K/uL   Lymphocytes Relative 25  12 - 46 %   Lymphs Abs 2.0  0.7 - 4.0 K/uL   Monocytes Relative 8  3 - 12 %   Monocytes Absolute 0.7  0.1 - 1.0 K/uL   Eosinophils Relative 2  0 - 5 %   Eosinophils Absolute 0.2  0.0 - 0.7 K/uL   Basophils Relative 0  0 - 1 %   Basophils Absolute 0.0  0.0 - 0.1 K/uL  COMPREHENSIVE METABOLIC PANEL     Status: Abnormal   Collection Time    05/04/13 11:10 AM      Result Value Range   Sodium 131 (*) 135 - 145 mEq/L   Potassium 4.0  3.5 - 5.1 mEq/L   Chloride 97  96 - 112 mEq/L   CO2 21  19 - 32 mEq/L   Glucose, Bld 150 (*) 70 - 99 mg/dL   BUN 12  6 - 23 mg/dL   Creatinine, Ser 1.61  0.50 - 1.35 mg/dL   Calcium 9.3  8.4 - 09.6  mg/dL   Total Protein 7.8  6.0 - 8.3 g/dL   Albumin 3.9  3.5 - 5.2 g/dL   AST 29  0 - 37 U/L   ALT 45  0 - 53 U/L   Alkaline Phosphatase 93  39 - 117 U/L   Total Bilirubin 0.4  0.3 - 1.2 mg/dL   GFR calc non Af Amer >90  >90 mL/min   GFR calc Af Amer >90  >90 mL/min   Comment: (NOTE)     The eGFR has been calculated using the CKD EPI equation.     This calculation has not been validated in all clinical situations.     eGFR's persistently <90 mL/min signify possible Chronic Kidney     Disease.  ETHANOL     Status: None   Collection Time    05/04/13 11:10 AM      Result Value Range   Alcohol, Ethyl (B) <11  0 - 11 mg/dL   Comment:            LOWEST DETECTABLE LIMIT FOR     SERUM ALCOHOL IS 11 mg/dL     FOR MEDICAL PURPOSES ONLY  URINE RAPID DRUG SCREEN (HOSP PERFORMED)     Status: Abnormal   Collection Time    05/04/13 11:28 AM      Result Value Range   Opiates NONE DETECTED  NONE DETECTED   Cocaine NONE DETECTED  NONE DETECTED   Benzodiazepines NONE DETECTED  NONE DETECTED   Amphetamines NONE DETECTED  NONE DETECTED   Tetrahydrocannabinol POSITIVE (*) NONE DETECTED   Barbiturates NONE DETECTED  NONE DETECTED   Comment:            DRUG SCREEN FOR MEDICAL PURPOSES     ONLY.  IF CONFIRMATION IS NEEDED     FOR ANY PURPOSE, NOTIFY LAB     WITHIN 5 DAYS.                LOWEST DETECTABLE LIMITS     FOR URINE DRUG SCREEN     Drug Class       Cutoff (ng/mL)     Amphetamine      1000     Barbiturate      200     Benzodiazepine   200     Tricyclics       300  Opiates          300     Cocaine          300     THC              50     No current facility-administered medications for this encounter.   No current outpatient prescriptions on file.    Psychiatric Specialty Exam:     Blood pressure 143/90, pulse 103, temperature 98.1 F (36.7 C), temperature source Oral, resp. rate 19, SpO2 94.00%.There is no weight on file to calculate BMI.  General Appearance: Casual   Eye Contact::  Good  Speech:  Clear and Coherent and Normal Rate  Volume:  Normal  Mood:  Anxious  Affect:  Congruent  Thought Process:  Circumstantial and Goal Directed  Orientation:  Full (Time, Place, and Person)  Thought Content:  Rumination  Suicidal Thoughts:  No  Homicidal Thoughts:  No  Memory:  Immediate;   Good Recent;   Good  Judgement:  Fair  Insight:  Present  Psychomotor Activity:  Normal  Concentration:  Good  Recall:  Good  Akathisia:  No  Handed:  Right  AIMS (if indicated):     Assets:  Communication Skills Desire for Improvement  Sleep:      Treatment Plan Summary: Outpatient resources  Disposition:  Discharge home with outpatient services.  Follow up with primary psychiatrist .   Assunta Found, FNP-BC 05/04/2013 3:00 PM

## 2013-05-05 NOTE — Consult Note (Signed)
Note reviewed and agreed with  

## 2013-05-08 NOTE — ED Provider Notes (Signed)
Medical screening examination/treatment/procedure(s) were performed by non-physician practitioner and as supervising physician I was immediately available for consultation/collaboration.  Samrat Hayward T Jacquelynn Friend, MD 05/08/13 1625 

## 2013-06-10 DIAGNOSIS — Z5181 Encounter for therapeutic drug level monitoring: Secondary | ICD-10-CM | POA: Diagnosis not present

## 2013-08-23 DIAGNOSIS — L259 Unspecified contact dermatitis, unspecified cause: Secondary | ICD-10-CM | POA: Diagnosis not present

## 2013-08-23 DIAGNOSIS — I10 Essential (primary) hypertension: Secondary | ICD-10-CM | POA: Diagnosis not present

## 2013-12-25 ENCOUNTER — Encounter (HOSPITAL_COMMUNITY): Payer: Self-pay | Admitting: Emergency Medicine

## 2013-12-25 ENCOUNTER — Emergency Department (HOSPITAL_COMMUNITY)
Admission: EM | Admit: 2013-12-25 | Discharge: 2013-12-26 | Disposition: A | Discharge status: Home / Self Care | Payer: Medicare Other | Attending: Emergency Medicine | Admitting: Emergency Medicine

## 2013-12-25 DIAGNOSIS — I10 Essential (primary) hypertension: Secondary | ICD-10-CM | POA: Insufficient documentation

## 2013-12-25 DIAGNOSIS — F1910 Other psychoactive substance abuse, uncomplicated: Secondary | ICD-10-CM | POA: Diagnosis present

## 2013-12-25 DIAGNOSIS — Z008 Encounter for other general examination: Secondary | ICD-10-CM | POA: Diagnosis not present

## 2013-12-25 DIAGNOSIS — F17200 Nicotine dependence, unspecified, uncomplicated: Secondary | ICD-10-CM | POA: Insufficient documentation

## 2013-12-25 DIAGNOSIS — F251 Schizoaffective disorder, depressive type: Secondary | ICD-10-CM | POA: Diagnosis present

## 2013-12-25 DIAGNOSIS — F259 Schizoaffective disorder, unspecified: Secondary | ICD-10-CM | POA: Diagnosis not present

## 2013-12-25 DIAGNOSIS — F209 Schizophrenia, unspecified: Secondary | ICD-10-CM | POA: Insufficient documentation

## 2013-12-25 LAB — CBC WITH DIFFERENTIAL/PLATELET
BASOS ABS: 0 10*3/uL (ref 0.0–0.1)
BASOS PCT: 0 % (ref 0–1)
EOS PCT: 2 % (ref 0–5)
Eosinophils Absolute: 0.3 10*3/uL (ref 0.0–0.7)
HEMATOCRIT: 45.9 % (ref 39.0–52.0)
Hemoglobin: 16.4 g/dL (ref 13.0–17.0)
LYMPHS PCT: 19 % (ref 12–46)
Lymphs Abs: 2.5 10*3/uL (ref 0.7–4.0)
MCH: 30.2 pg (ref 26.0–34.0)
MCHC: 35.7 g/dL (ref 30.0–36.0)
MCV: 84.5 fL (ref 78.0–100.0)
MONO ABS: 1.1 10*3/uL — AB (ref 0.1–1.0)
Monocytes Relative: 8 % (ref 3–12)
Neutro Abs: 9.6 10*3/uL — ABNORMAL HIGH (ref 1.7–7.7)
Neutrophils Relative %: 71 % (ref 43–77)
Platelets: 250 10*3/uL (ref 150–400)
RBC: 5.43 MIL/uL (ref 4.22–5.81)
RDW: 12.2 % (ref 11.5–15.5)
WBC: 13.5 10*3/uL — ABNORMAL HIGH (ref 4.0–10.5)

## 2013-12-25 LAB — ACETAMINOPHEN LEVEL: Acetaminophen (Tylenol), Serum: <15 ug/mL (ref 10–30)

## 2013-12-25 LAB — COMPREHENSIVE METABOLIC PANEL
ALT: 30 U/L (ref 0–53)
ANION GAP: 15 (ref 5–15)
AST: 18 U/L (ref 0–37)
Albumin: 3.8 g/dL (ref 3.5–5.2)
Alkaline Phosphatase: 93 U/L (ref 39–117)
BUN: 9 mg/dL (ref 6–23)
CALCIUM: 9.3 mg/dL (ref 8.4–10.5)
CO2: 22 meq/L (ref 19–32)
CREATININE: 0.82 mg/dL (ref 0.50–1.35)
Chloride: 99 mEq/L (ref 96–112)
GFR calc Af Amer: >90 mL/min (ref >90)
Glucose, Bld: 182 mg/dL — ABNORMAL HIGH (ref 70–99)
Potassium: 4 mEq/L (ref 3.7–5.3)
Sodium: 136 mEq/L — ABNORMAL LOW (ref 137–147)
Total Bilirubin: 0.7 mg/dL (ref 0.3–1.2)
Total Protein: 7.8 g/dL (ref 6.0–8.3)

## 2013-12-25 LAB — SALICYLATE LEVEL

## 2013-12-25 LAB — RAPID URINE DRUG SCREEN, HOSP PERFORMED
AMPHETAMINES: NOT DETECTED
Barbiturates: NOT DETECTED
Benzodiazepines: NOT DETECTED
Cocaine: NOT DETECTED
OPIATES: NOT DETECTED
Tetrahydrocannabinol: POSITIVE — AB

## 2013-12-25 LAB — ETHANOL

## 2013-12-25 MED ORDER — AMLODIPINE BESYLATE 2.5 MG PO TABS
2.5000 mg | ORAL_TABLET | Freq: Every day | ORAL | Status: DC
  Administered 2013-12-25 – 2013-12-26 (×2): 2.5 mg via ORAL
  Filled 2013-12-25 (×3): qty 1

## 2013-12-25 MED ORDER — ZOLPIDEM TARTRATE 5 MG PO TABS: 5.0000 mg | ORAL_TABLET | Freq: Every evening | ORAL | Status: DC | PRN

## 2013-12-25 MED ORDER — SERTRALINE HCL 50 MG PO TABS
50.0000 mg | ORAL_TABLET | Freq: Every day | ORAL | Status: DC
  Administered 2013-12-25 – 2013-12-26 (×2): 50 mg via ORAL
  Filled 2013-12-25 (×2): qty 1

## 2013-12-25 MED ORDER — ALUM & MAG HYDROXIDE-SIMETH 200-200-20 MG/5ML PO SUSP: 30.0000 mL | ORAL | Status: DC | PRN

## 2013-12-25 MED ORDER — ONDANSETRON HCL 4 MG PO TABS: 4.0000 mg | ORAL_TABLET | Freq: Three times a day (TID) | ORAL | Status: DC | PRN

## 2013-12-25 MED ORDER — NICOTINE 21 MG/24HR TD PT24: 21.0000 mg | MEDICATED_PATCH | Freq: Every day | TRANSDERMAL | Status: DC

## 2013-12-25 MED ORDER — TRAZODONE HCL 50 MG PO TABS
50.0000 mg | ORAL_TABLET | Freq: Every evening | ORAL | Status: DC | PRN
  Administered 2013-12-25: 50 mg via ORAL
  Filled 2013-12-25: qty 1

## 2013-12-25 MED ORDER — LORAZEPAM 1 MG PO TABS
1.0000 mg | ORAL_TABLET | Freq: Three times a day (TID) | ORAL | Status: DC | PRN
  Administered 2013-12-25: 1 mg via ORAL
  Filled 2013-12-25: qty 1

## 2013-12-25 MED ORDER — IBUPROFEN 200 MG PO TABS
600.0000 mg | ORAL_TABLET | Freq: Three times a day (TID) | ORAL | Status: DC | PRN
  Administered 2013-12-25: 600 mg via ORAL
  Filled 2013-12-25: qty 3

## 2013-12-25 MED ORDER — ACETAMINOPHEN 325 MG PO TABS: 650.0000 mg | ORAL_TABLET | ORAL | Status: DC | PRN

## 2013-12-25 NOTE — ED Notes (Signed)
1 bag of belongings at desk

## 2013-12-25 NOTE — BH Assessment (Signed)
Assessment Note  [name] is an 28 y.o. male.  The PT reported being IVC and brought to ED by [employer] after getting into a verbal altercation w/his Mother.  PT reported he was told by his Mother that he threaten to kill people.  PT reported constantly getting into verbal altercations w/his Mother.  The PT denied current SI, HI, AH, and VH.  He reported experiencing SI w/a plan 6 mos ago.  PT reported being hospitalized for MH 5x in the past 5 yrs.  He reported currently having an ACTT Team and being prescribed monthly Invega IV and Zoloft 2x per day.  PT reported being currently med compliant.  PT denied any ETOH or other illicit drug use.  He reported typically sleeping 6 hrs per night, having a good appetite w/no weight fluctuations, and mostly having a good mood.     Axis I: Schizoaffective Disorder Axis II: Deferred Axis IV: other psychosocial or environmental problems and problems with primary support group Axis V: 31-40 impairment in reality testing  Past Medical History:  Past Medical History  Diagnosis Date  . Hypertension   . Depression   . Schizophrenia     No past surgical history on file.  Family History:  Family History  Problem Relation Age of Onset  . Allergies Mother   . Asthma Mother     Social History:  reports that he has been smoking Cigarettes.  He has a 8 pack-year smoking history. He does not have any smokeless tobacco history on file. He reports that he uses illicit drugs (Marijuana) about 3 times per week. He reports that he does not drink alcohol.  Additional Social History:     CIWA: CIWA-Ar BP: 135/84 mmHg Pulse Rate: 96 COWS:    Allergies: No Known Allergies  Home Medications:  (Not in a hospital admission)  OB/GYN Status:  No LMP for male patient.  General Assessment Data Location of Assessment: [facility] ED ACT Assessment: Yes Is this a Tele or Face-to-Face Assessment?: Face-to-Face Is this an Initial Assessment or a Re-assessment for this  encounter?: Initial Assessment Living Arrangements: Other (Comment) (PT reported living w/2 other Roommates) Can pt return to current living arrangement?: Yes Admission Status: Involuntary Is patient capable of signing voluntary admission?: No Transfer from: Home Referral Source: Self/Family/Friend  Medical Screening Exam [facility]) Medical Exam completed: Yes  [facility] Living Arrangements: Other (Comment) (PT reported living w/2 other Roommates) Name of Psychiatrist: Unknown (PT could not remember) Name of Therapist: Unknown (PT could not remember)  Education Status Is patient currently in school?: No Current Grade: N/A Highest grade of school patient has completed: N/A Name of school: N/A Contact person: N/A  Risk to self with the past 6 months Suicidal Ideation: No (Per Pt's report) Suicidal Intent: No-Not Currently/Within Last 6 Months (Per Pt's report) Is patient at risk for suicide?: No Suicidal Plan?: No-Not Currently/Within Last 6 Months Access to Means: No What has been your use of drugs/alcohol within the last 12 months?: PT denied How many times?: 0 Other Self Harm Risks: 0 Triggers for Past Attempts: None known Intentional Self Injurious Behavior: None Family Suicide History: Unknown Recent stressful life event(s): Conflict (Comment) (PT reported constant verbal altercations with his Mother.) Persecutory voices/beliefs?: No Depression: Yes Depression Symptoms: Feeling worthless/self pity Substance abuse history and/or treatment for substance abuse?: No Suicide prevention information given to non-admitted patients: Not applicable  Risk to Others within the past 6 months Homicidal Ideation: No (PT reported  his Mother stated he threaten to kill people) Thoughts of Harm to Others: No (Per Pt's report) Current Homicidal Intent: No (Per Pt's report) Current Homicidal Plan: No Access to Homicidal Means: No Identified Victim: N/A History of  harm to others?: No Assessment of Violence: None Noted Violent Behavior Description: None Does patient have access to weapons?: No Criminal Charges Pending?: No Does patient have a court date: No  Psychosis Hallucinations: None noted Delusions: None noted  Mental Status Report Appear/Hygiene: In hospital gown Eye Contact: Poor Motor Activity: Unremarkable Speech: Logical/coherent Level of Consciousness: Alert Mood: Ambivalent Affect: Flat Anxiety Level: Moderate Thought Processes: Coherent Judgement: Impaired Orientation: Person;Place;Time Obsessive Compulsive Thoughts/Behaviors: None  Cognitive Functioning Concentration: Decreased Memory: Recent Intact;Remote Intact IQ: Average Insight: Poor Impulse Control: Poor Appetite: Good Weight Loss: 0 Weight Gain: 0 Sleep: No Change Total Hours of Sleep: 6 Vegetative Symptoms: None  ADLScreening [facility] Assessment Services) Patient's cognitive ability adequate to safely complete daily activities?: Yes Patient able to express need for assistance with ADLs?: Yes Independently performs ADLs?: Yes (appropriate for developmental age)  Prior Inpatient Therapy Prior Inpatient Therapy: Yes (5x in 5 yurs, [facility] and [facility]) Prior Therapy Dates: 2014, 2013 Prior Therapy Facilty/Provider(s): [facility], [facility] Reason for Treatment: Mood  Prior Outpatient Therapy Prior Outpatient Therapy: Yes (ACTT Team) Prior Therapy Dates: Current Prior Therapy Facilty/Provider(s): Unknown (PT could not recall) Reason for Treatment: Behavior  ADL Screening (condition at time of admission) Patient's cognitive ability adequate to safely complete daily activities?: Yes Is the patient deaf or have difficulty hearing?: No Does the patient have difficulty seeing, even when wearing glasses/contacts?: No Does the patient have difficulty concentrating, remembering, or making decisions?: No Patient able to express need for assistance with ADLs?: Yes Does the  patient have difficulty dressing or bathing?: No Independently performs ADLs?: Yes (appropriate for developmental age) Does the patient have difficulty walking or climbing stairs?: No Weakness of Legs: None Weakness of Arms/Hands: None  Home Assistive Devices/Equipment Home Assistive Devices/Equipment: None    Abuse/Neglect Assessment (Assessment to be complete while patient is alone) Physical Abuse: Denies Verbal Abuse: Denies Sexual Abuse: Denies Exploitation of patient/patient's resources: Denies Self-Neglect: Denies Values / Beliefs Cultural Requests During Hospitalization: None Spiritual Requests During Hospitalization: None        Additional Information 1:1 In Past 12 Months?: No CIRT Risk: No Elopement Risk: No Does patient have medical clearance?: Yes     Disposition:  Disposition Initial Assessment Completed for this Encounter: Yes Disposition of Patient: Inpatient treatment program Type of inpatient treatment program: Adult (PT assess by psychiatric in the morning)  On Site Evaluation by:   Reviewed with Physician:    Dey-Johnson,Nataniel Gasper 12/25/2013 3:40 PM
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
## 2013-12-25 NOTE — ED Notes (Signed)
Orange shirt Long sleeve orange shirt shorts Hat([place]) Watch and bracelet Tennis shoes/socks boxers

## 2013-12-25 NOTE — ED Provider Notes (Signed)
CSN: [id number]     Arrival date & time 12/25/13  1131 History   First MD Initiated Contact with Patient 12/25/13 1136     Chief Complaint  Patient presents with  . IVC      (Consider location/radiation/quality/duration/timing/severity/associated sxs/prior Treatment) The history is provided by the patient and medical records.   This is a 28 year old male with past medical history significant for hypertension, depression, schizophrenia, presenting to the ED under IVC by his mother. IVC paperwork states that this patient has not been taking his medications, sleeping regularly, or tending to his personal hygiene. Report states the patient expressed that he wanted to end his life and kill others. Also notes daily marijuana use.  Patient has been involuntarily committed in the past.  Assays of the past several weeks he has been having "mood swings". He states he has been having issues with his depression, but denies any suicidal or homicidal ideation. Denies any alcohol or illicit drug use. Pt reports he has been taking his invega as prescribed.  He states he thinks symptoms are worsening after he attended a support group for other schizophrenics, states he felt that he was "feeding off of their problems" in addition to his own.  Pt states he does see a psychiatrist regularly.  Past Medical History  Diagnosis Date  . Hypertension   . Depression   . Schizophrenia    No past surgical history on file. Family History  Problem Relation Age of Onset  . Allergies Mother   . Asthma Mother    History  Substance Use Topics  . Smoking status: Current Every Day Smoker -- 1.00 packs/day for 8 years    Types: Cigarettes  . Smokeless tobacco: Not on file  . Alcohol Use: No    Review of Systems  Psychiatric/Behavioral: Positive for suicidal ideas.       IVC  All other systems reviewed and are negative.     Allergies  Review of patient's allergies indicates no known allergies.  Home  Medications   Prior to Admission medications   Not on File   BP 134/79  Pulse 109  Temp(Src) 98.7 F (37.1 C) (Oral)  Resp 20  SpO2 97%  Physical Exam  Nursing note and vitals reviewed. Constitutional: He is oriented to person, place, and time. He appears well-developed and well-nourished. No distress.  HENT:  Head: Normocephalic and atraumatic.  Mouth/Throat: Oropharynx is clear and moist.  Eyes: Conjunctivae and EOM are normal. Pupils are equal, round, and reactive to light.  Neck: Normal range of motion. Neck supple.  Cardiovascular: Normal rate, regular rhythm and normal heart sounds.   Pulmonary/Chest: Effort normal and breath sounds normal. No respiratory distress. He has no wheezes.  Abdominal: Soft. Bowel sounds are normal. There is no tenderness. There is no guarding.  Musculoskeletal: Normal range of motion. He exhibits no edema.  Neurological: He is alert and oriented to person, place, and time.  Skin: Skin is warm and dry. He is not diaphoretic.  Psychiatric: He has a normal mood and affect. His speech is normal and behavior is normal. He is not actively hallucinating. He expresses no homicidal and no suicidal ideation. He expresses no suicidal plans and no homicidal plans.  Normal mood and affect, pt calm and cooperative; patient denies SI/HI/AVH    ED Course  Procedures (including critical care time) Labs Review Labs Reviewed  CBC WITH DIFFERENTIAL - Abnormal; Notable for the following:    WBC 13.5 (*)    Neutro  Abs 9.6 (*)    Monocytes Absolute 1.1 (*)    All other components within normal limits  COMPREHENSIVE METABOLIC PANEL - Abnormal; Notable for the following:    Sodium 136 (*)    Glucose, Bld 182 (*)    All other components within normal limits  URINE RAPID DRUG SCREEN (HOSP PERFORMED) - Abnormal; Notable for the following:    Tetrahydrocannabinol POSITIVE (*)    All other components within normal limits  SALICYLATE LEVEL - Abnormal; Notable for the  following:    Salicylate Lvl <2.0 (*)    All other components within normal limits  ETHANOL  ACETAMINOPHEN LEVEL    Imaging Review No results found.   EKG Interpretation None      MDM   Final diagnoses:  Schizophrenia, unspecified type   28 y.o. M here under IVC petitioned by his mother.  Pt denies all accusations in IVC paperwork, states he is just having trouble with mood swings.  No physical complaints at this time.  Labs were obtained and are reassuring.  Patient medically cleared and awaiting TTS evaluation.  TTS has evaluated patient, recommends IP treatment.  Psychiatry to see patient in the morning.  Temporary holding orders and home meds have been ordered.  Garlon Hatchet, PA-C 12/25/13 1918

## 2013-12-25 NOTE — ED Notes (Signed)
Pt BIB [employer] IVC.  Papers state: "Pt has been dx w/ schizophrenia affective.  Respondent is prescribed invega sustenna.  Respondent is not sleeping regularly nor tending to his personal hygiene.  Respondent stated to his mother that he wants to end his life and kill others.  Respondent is smoking marijuana on a daily basis.  Respondent has been committed before.

## 2013-12-25 NOTE — [facility] Counselor (Signed)
Spoke with Sharilyn SitesLisa Sanders, PA-C about PT.  Provided PT disposition to PA-C.

## 2013-12-26 ENCOUNTER — Encounter (HOSPITAL_COMMUNITY): Payer: Self-pay | Admitting: Psychiatry

## 2013-12-26 DIAGNOSIS — F259 Schizoaffective disorder, unspecified: Secondary | ICD-10-CM

## 2013-12-26 DIAGNOSIS — F209 Schizophrenia, unspecified: Secondary | ICD-10-CM | POA: Diagnosis not present
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
## 2013-12-26 NOTE — ED Notes (Signed)
D/C instructions reviewed. No new prescriptions written. Denies pain. Denies SI, HI and AVH. No complaints voiced. Ambulatory without difficulty. States his mom is coming to transport him home. Belongings bag x1 returned. Escorted by mental health tech to front of hospital.

## 2013-12-26 NOTE — Consult Note (Signed)
[facility] Consult   Reason for Consult:  Altercation with his mother Referring Physician:  EDP  [name] is an 28 y.o. male. Total Time spent with patient: 20 minutes  Assessment: AXIS I:  Schizoaffective Disorder AXIS II:  Deferred AXIS III:   Past Medical History  Diagnosis Date  . Hypertension   . Depression   . Schizophrenia    AXIS IV:  problems with primary support group AXIS V:  61-70 mild symptoms  Plan:  No evidence of imminent risk to self or others at present.  Dr. Adele Schilder assessed the patient and concurs with the plan.  Subjective:   [name] is a 28 y.o. male patient does not warrant admission.  HPI:  Patient denies suicidal/homicidal ideations, hallucinations, and alcohol/drug use except marijuana on occasion.  He states he and his mother got into a verbal argument and what he said may have been misinterpreted.  Manual states he loves his mother but gets frustrated at her trying to help him at times.  She had come to his place where he lives with 2 roommates before the argument.  He wants her to know that he loves her but is trying to be independent. HPI Elements:   Location:  generalized. Quality:  acute. Severity:  mild. Timing:  brief. Duration:  \brief. Context:  altercation with mother.  Past Psychiatric History: Past Medical History  Diagnosis Date  . Hypertension   . Depression   . Schizophrenia     reports that he has been smoking Cigarettes.  He has a 8 pack-year smoking history. He does not have any smokeless tobacco history on file. He reports that he uses illicit drugs (Marijuana) about 3 times per week. He reports that he does not drink alcohol. Family History  Problem Relation Age of Onset  . Allergies Mother   . Asthma Mother    Family History Substance Abuse: No Family Supports: Yes, List: (Mother) Living Arrangements: Other (Comment) (PT reported living w/2 other Roommates) Can pt return to current living  arrangement?: Yes Abuse/Neglect [facility]) Physical Abuse: Denies Verbal Abuse: Denies Sexual Abuse: Denies Allergies:  No Known Allergies  ACT Assessment Complete:  Yes:    Educational Status    Risk to Self: Risk to self with the past 6 months Suicidal Ideation: No (Per Pt's report) Suicidal Intent: No-Not Currently/Within Last 6 Months (Per Pt's report) Is patient at risk for suicide?: No Suicidal Plan?: No-Not Currently/Within Last 6 Months Access to Means: No What has been your use of drugs/alcohol within the last 12 months?: PT denied How many times?: 0 Other Self Harm Risks: 0 Triggers for Past Attempts: None known Intentional Self Injurious Behavior: None Family Suicide History: Unknown Recent stressful life event(s): Conflict (Comment) (PT reported constant verbal altercations with his Mother.) Persecutory voices/beliefs?: No Depression: Yes Depression Symptoms: Feeling worthless/self pity Substance abuse history and/or treatment for substance abuse?: No Suicide prevention information given to non-admitted patients: Not applicable  Risk to Others: Risk to Others within the past 6 months Homicidal Ideation: No (PT reported his Mother stated he threaten to kill people) Thoughts of Harm to Others: No (Per Pt's report) Current Homicidal Intent: No (Per Pt's report) Current Homicidal Plan: No Access to Homicidal Means: No Identified Victim: N/A History of harm to others?: No Assessment of Violence: None Noted Violent Behavior Description: None Does patient have access to weapons?: No Criminal Charges Pending?: No Does patient have a court date: No  Abuse: Abuse/Neglect Assessment (Assessment to be  complete while patient is alone) Physical Abuse: Denies Verbal Abuse: Denies Sexual Abuse: Denies Exploitation of patient/patient's resources: Denies Self-Neglect: Denies  Prior Inpatient Therapy: Prior Inpatient Therapy Prior Inpatient Therapy: Yes (5x in 5 yurs, [facility] and  [facility]) Prior Therapy Dates: 2014, 2013 Prior Therapy Facilty/Provider(s): [facility], [facility] Reason for Treatment: Mood  Prior Outpatient Therapy: Prior Outpatient Therapy Prior Outpatient Therapy: Yes (ACTT Team) Prior Therapy Dates: Current Prior Therapy Facilty/Provider(s): Unknown (PT could not recall) Reason for Treatment: Behavior  Additional Information: Additional Information 1:1 In Past 12 Months?: No CIRT Risk: No Elopement Risk: No Does patient have medical clearance?: Yes                  Objective: Blood pressure 134/82, pulse 84, temperature 97.5 F (36.4 C), temperature source Oral, resp. rate 20, SpO2 94.00%.There is no weight on file to calculate BMI. Results for orders placed during the hospital encounter of 12/25/13 (from the past 72 hour(s))  CBC WITH DIFFERENTIAL     Status: Abnormal   Collection Time    12/25/13 12:00 PM      Result Value Ref Range   WBC 13.5 (*) 4.0 - 10.5 K/uL   RBC 5.43  4.22 - 5.81 MIL/uL   Hemoglobin 16.4  13.0 - 17.0 g/dL   HCT 45.9  39.0 - 52.0 %   MCV 84.5  78.0 - 100.0 fL   MCH 30.2  26.0 - 34.0 pg   MCHC 35.7  30.0 - 36.0 g/dL   RDW 12.2  11.5 - 15.5 %   Platelets 250  150 - 400 K/uL   Neutrophils Relative % 71  43 - 77 %   Neutro Abs 9.6 (*) 1.7 - 7.7 K/uL   Lymphocytes Relative 19  12 - 46 %   Lymphs Abs 2.5  0.7 - 4.0 K/uL   Monocytes Relative 8  3 - 12 %   Monocytes Absolute 1.1 (*) 0.1 - 1.0 K/uL   Eosinophils Relative 2  0 - 5 %   Eosinophils Absolute 0.3  0.0 - 0.7 K/uL   Basophils Relative 0  0 - 1 %   Basophils Absolute 0.0  0.0 - 0.1 K/uL  COMPREHENSIVE METABOLIC PANEL     Status: Abnormal   Collection Time    12/25/13 12:00 PM      Result Value Ref Range   Sodium 136 (*) 137 - 147 mEq/L   Potassium 4.0  3.7 - 5.3 mEq/L   Chloride 99  96 - 112 mEq/L   CO2 22  19 - 32 mEq/L   Glucose, Bld 182 (*) 70 - 99 mg/dL   BUN 9  6 - 23 mg/dL   Creatinine, Ser 0.82  0.50 - 1.35 mg/dL   Calcium 9.3  8.4 -  10.5 mg/dL   Total Protein 7.8  6.0 - 8.3 g/dL   Albumin 3.8  3.5 - 5.2 g/dL   AST 18  0 - 37 U/L   ALT 30  0 - 53 U/L   Alkaline Phosphatase 93  39 - 117 U/L   Total Bilirubin 0.7  0.3 - 1.2 mg/dL   GFR calc non Af Amer >90  >90 mL/min   GFR calc Af Amer >90  >90 mL/min   Comment: (NOTE)     The eGFR has been calculated using the CKD EPI equation.     This calculation has not been validated in all clinical situations.     eGFR's persistently <90 mL/min signify  possible Chronic Kidney     Disease.   Anion gap 15  5 - 15  ETHANOL     Status: None   Collection Time    12/25/13 12:00 PM      Result Value Ref Range   Alcohol, Ethyl (B) <11  0 - 11 mg/dL   Comment:            LOWEST DETECTABLE LIMIT FOR     SERUM ALCOHOL IS 11 mg/dL     FOR MEDICAL PURPOSES ONLY  ACETAMINOPHEN LEVEL     Status: None   Collection Time    12/25/13 12:00 PM      Result Value Ref Range   Acetaminophen (Tylenol), Serum <15.0  10 - 30 ug/mL   Comment:            THERAPEUTIC CONCENTRATIONS VARY     SIGNIFICANTLY. A RANGE OF 10-30     ug/mL MAY BE AN EFFECTIVE     CONCENTRATION FOR MANY PATIENTS.     HOWEVER, SOME ARE BEST TREATED     AT CONCENTRATIONS OUTSIDE THIS     RANGE.     ACETAMINOPHEN CONCENTRATIONS     >150 ug/mL AT 4 HOURS AFTER     INGESTION AND >50 ug/mL AT 12     HOURS AFTER INGESTION ARE     OFTEN ASSOCIATED WITH TOXIC     REACTIONS.  SALICYLATE LEVEL     Status: Abnormal   Collection Time    12/25/13 12:00 PM      Result Value Ref Range   Salicylate Lvl <1.4 (*) 2.8 - 20.0 mg/dL  URINE RAPID DRUG SCREEN (HOSP PERFORMED)     Status: Abnormal   Collection Time    12/25/13 12:18 PM      Result Value Ref Range   Opiates NONE DETECTED  NONE DETECTED   Cocaine NONE DETECTED  NONE DETECTED   Benzodiazepines NONE DETECTED  NONE DETECTED   Amphetamines NONE DETECTED  NONE DETECTED   Tetrahydrocannabinol POSITIVE (*) NONE DETECTED   Barbiturates NONE DETECTED  NONE DETECTED    Comment:            DRUG SCREEN FOR MEDICAL PURPOSES     ONLY.  IF CONFIRMATION IS NEEDED     FOR ANY PURPOSE, NOTIFY LAB     WITHIN 5 DAYS.                LOWEST DETECTABLE LIMITS     FOR URINE DRUG SCREEN     Drug Class       Cutoff (ng/mL)     Amphetamine      1000     Barbiturate      200     Benzodiazepine   481     Tricyclics       856     Opiates          300     Cocaine          300     THC              50   Labs are reviewed and are pertinent for no medical issues noted.  Current Facility-Administered Medications  Medication Dose Route Frequency Provider Last Rate Last Dose  . acetaminophen (TYLENOL) tablet 650 mg  650 mg Oral Q4H PRN Larene Pickett, PA-C      . alum & mag hydroxide-simeth (MAALOX/MYLANTA) 200-200-20 MG/5ML suspension 30 mL  30 mL Oral PRN  Larene Pickett, PA-C      . amLODipine [facility]) tablet 2.5 mg  2.5 mg Oral Daily Larene Pickett, PA-C   2.5 mg at 12/25/13 1623  . ibuprofen (ADVIL,MOTRIN) tablet 600 mg  600 mg Oral Q8H PRN Larene Pickett, PA-C   600 mg at 12/25/13 2119  . LORazepam (ATIVAN) tablet 1 mg  1 mg Oral Q8H PRN Larene Pickett, PA-C   1 mg at 12/25/13 2119  . nicotine (NICODERM CQ - dosed in mg/24 hours) patch 21 mg  21 mg Transdermal Daily Larene Pickett, PA-C      . ondansetron [facility]) tablet 4 mg  4 mg Oral Q8H PRN Larene Pickett, PA-C      . sertraline (ZOLOFT) tablet 50 mg  50 mg Oral Daily Larene Pickett, PA-C   50 mg at 12/25/13 1526  . traZODone (DESYREL) tablet 50 mg  50 mg Oral QHS PRN Lurena Nida, NP   50 mg at 12/25/13 2119   Current Outpatient Prescriptions  Medication Sig Dispense Refill  . amLODipine (NORVASC) 2.5 MG tablet Take 2.5 mg by mouth daily.      . sertraline (ZOLOFT) 50 MG tablet Take 50 mg by mouth daily.        Psychiatric Specialty Exam:     Blood pressure 134/82, pulse 84, temperature 97.5 F (36.4 C), temperature source Oral, resp. rate 20, SpO2 94.00%.There is no weight on file to calculate BMI.   General Appearance: Casual  Eye Contact::  Good  Speech:  Normal Rate  Volume:  Normal  Mood:  Euthymic  Affect:  Congruent  Thought Process:  Coherent  Orientation:  Full (Time, Place, and Person)  Thought Content:  WDL  Suicidal Thoughts:  No  Homicidal Thoughts:  No  Memory:  Immediate;   Good Recent;   Good Remote;   Good  Judgement:  Fair  Insight:  Fair  Psychomotor Activity:  Normal  Concentration:  Good  Recall:  Good  Fund of Knowledge:Good  Language: Good  Akathisia:  No  Handed:  Right  AIMS (if indicated):     Assets:  Catering manager Housing Leisure Time Physical Health Resilience Social Support  Sleep:      Musculoskeletal: Strength & Muscle Tone: within normal limits Gait & Station: normal Patient leans: N/A  Treatment Plan Summary: Discharge home with follow-up with his ACT team.  Waylan Boga, [place] 12/26/2013 9:01 AM  I have personally seen the patient and agreed with the findings and involved in the treatment plan. Berniece Andreas, MD

## 2013-12-26 NOTE — ED Provider Notes (Signed)
Medical screening examination/treatment/procedure(s) were performed by non-physician practitioner and as supervising physician I was immediately available for consultation/collaboration.   EKG Interpretation None       Raeford RazorStephen Pranathi Winfree, MD 12/26/13 [phone]

## 2014-01-23 ENCOUNTER — Emergency Department (HOSPITAL_COMMUNITY)
Admission: EM | Admit: 2014-01-23 | Discharge: 2014-01-25 | Disposition: A | Discharge status: Home / Self Care | Payer: Medicare Other | Attending: Emergency Medicine | Admitting: Emergency Medicine

## 2014-01-23 ENCOUNTER — Encounter (HOSPITAL_COMMUNITY): Payer: Self-pay | Admitting: Emergency Medicine

## 2014-01-23 DIAGNOSIS — Z79899 Other long term (current) drug therapy: Secondary | ICD-10-CM | POA: Diagnosis not present

## 2014-01-23 DIAGNOSIS — F603 Borderline personality disorder: Secondary | ICD-10-CM | POA: Diagnosis not present

## 2014-01-23 DIAGNOSIS — F17200 Nicotine dependence, unspecified, uncomplicated: Secondary | ICD-10-CM | POA: Diagnosis not present

## 2014-01-23 DIAGNOSIS — Z046 Encounter for general psychiatric examination, requested by authority: Secondary | ICD-10-CM | POA: Diagnosis not present

## 2014-01-23 DIAGNOSIS — F259 Schizoaffective disorder, unspecified: Secondary | ICD-10-CM | POA: Insufficient documentation

## 2014-01-23 DIAGNOSIS — I10 Essential (primary) hypertension: Secondary | ICD-10-CM | POA: Diagnosis not present

## 2014-01-23 DIAGNOSIS — G4700 Insomnia, unspecified: Secondary | ICD-10-CM

## 2014-01-23 DIAGNOSIS — R4689 Other symptoms and signs involving appearance and behavior: Secondary | ICD-10-CM

## 2014-01-23 DIAGNOSIS — F251 Schizoaffective disorder, depressive type: Secondary | ICD-10-CM | POA: Diagnosis present

## 2014-01-23 DIAGNOSIS — F3289 Other specified depressive episodes: Secondary | ICD-10-CM | POA: Insufficient documentation

## 2014-01-23 DIAGNOSIS — F1910 Other psychoactive substance abuse, uncomplicated: Secondary | ICD-10-CM

## 2014-01-23 DIAGNOSIS — F911 Conduct disorder, childhood-onset type: Secondary | ICD-10-CM | POA: Diagnosis not present

## 2014-01-23 DIAGNOSIS — R45851 Suicidal ideations: Secondary | ICD-10-CM | POA: Diagnosis not present

## 2014-01-23 DIAGNOSIS — R45850 Homicidal ideations: Secondary | ICD-10-CM | POA: Diagnosis not present

## 2014-01-23 DIAGNOSIS — F329 Major depressive disorder, single episode, unspecified: Secondary | ICD-10-CM | POA: Insufficient documentation

## 2014-01-23 LAB — COMPREHENSIVE METABOLIC PANEL
ALBUMIN: 3.8 g/dL (ref 3.5–5.2)
ALT: 47 U/L (ref 0–53)
ANION GAP: 14 (ref 5–15)
AST: 25 U/L (ref 0–37)
Alkaline Phosphatase: 93 U/L (ref 39–117)
BUN: 11 mg/dL (ref 6–23)
CHLORIDE: 99 meq/L (ref 96–112)
CO2: 25 mEq/L (ref 19–32)
Calcium: 9.8 mg/dL (ref 8.4–10.5)
Creatinine, Ser: 0.9 mg/dL (ref 0.50–1.35)
GFR calc Af Amer: >90 mL/min (ref >90)
GFR calc non Af Amer: >90 mL/min (ref >90)
GLUCOSE: 123 mg/dL — AB (ref 70–99)
Potassium: 4 mEq/L (ref 3.7–5.3)
Sodium: 138 mEq/L (ref 137–147)
Total Protein: 8 g/dL (ref 6.0–8.3)

## 2014-01-23 LAB — CBC
HEMATOCRIT: 43.6 % (ref 39.0–52.0)
HEMOGLOBIN: 15.9 g/dL (ref 13.0–17.0)
MCH: 30.5 pg (ref 26.0–34.0)
MCHC: 36.5 g/dL — ABNORMAL HIGH (ref 30.0–36.0)
MCV: 83.7 fL (ref 78.0–100.0)
Platelets: 308 10*3/uL (ref 150–400)
RBC: 5.21 MIL/uL (ref 4.22–5.81)
RDW: 12 % (ref 11.5–15.5)
WBC: 10 10*3/uL (ref 4.0–10.5)

## 2014-01-23 LAB — RAPID URINE DRUG SCREEN, HOSP PERFORMED
Amphetamines: NOT DETECTED
BARBITURATES: NOT DETECTED
Benzodiazepines: NOT DETECTED
COCAINE: NOT DETECTED
Opiates: NOT DETECTED
TETRAHYDROCANNABINOL: NOT DETECTED

## 2014-01-23 LAB — ETHANOL: Alcohol, Ethyl (B): <11 mg/dL (ref 0–11)

## 2014-01-23 LAB — SALICYLATE LEVEL: Salicylate Lvl: 2 mg/dL — ABNORMAL LOW (ref 2.8–20.0)

## 2014-01-23 LAB — ACETAMINOPHEN LEVEL

## 2014-01-23 MED ORDER — ONDANSETRON HCL 4 MG PO TABS
4.0000 mg | ORAL_TABLET | Freq: Three times a day (TID) | ORAL | Status: DC | PRN
Start: 2014-01-23 — End: 2014-01-25

## 2014-01-23 MED ORDER — ZOLPIDEM TARTRATE 10 MG PO TABS
10.0000 mg | ORAL_TABLET | Freq: Every evening | ORAL | Status: DC | PRN
  Administered 2014-01-23: 10 mg via ORAL
  Filled 2014-01-23: qty 1

## 2014-01-23 MED ORDER — LORAZEPAM 1 MG PO TABS
1.0000 mg | ORAL_TABLET | Freq: Three times a day (TID) | ORAL | Status: DC | PRN
  Administered 2014-01-23: 1 mg via ORAL
  Filled 2014-01-23 (×2): qty 1

## 2014-01-23 MED ORDER — ALUM & MAG HYDROXIDE-SIMETH 200-200-20 MG/5ML PO SUSP: 30.0000 mL | ORAL | Status: DC | PRN

## 2014-01-23 MED ORDER — NICOTINE 21 MG/24HR TD PT24: 21.0000 mg | MEDICATED_PATCH | Freq: Every day | TRANSDERMAL | Status: DC

## 2014-01-23 MED ORDER — IBUPROFEN 200 MG PO TABS
600.0000 mg | ORAL_TABLET | Freq: Three times a day (TID) | ORAL | Status: DC | PRN
  Administered 2014-01-24: 600 mg via ORAL
  Filled 2014-01-23: qty 3

## 2014-01-23 MED ORDER — ACETAMINOPHEN 325 MG PO TABS: 650.0000 mg | ORAL_TABLET | ORAL | Status: DC | PRN
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
## 2014-01-23 NOTE — ED Notes (Signed)
Bed: [facility]4 Expected date:  Expected time:  Means of arrival:  Comments: [employer]: IVC - calm at present

## 2014-01-23 NOTE — ED Notes (Signed)
TTS in progress 

## 2014-01-23 NOTE — ED Notes (Signed)
telepsych in progress 
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
## 2014-01-23 NOTE — ED Notes (Signed)
MD at bedside. 

## 2014-01-23 NOTE — ED Notes (Signed)
Patient presents calm and cooperative denies any current thoughts of self harm or thoughts to harm others; denies AVH. NAD

## 2014-01-24 ENCOUNTER — Encounter (HOSPITAL_COMMUNITY): Payer: Self-pay | Admitting: Psychiatry

## 2014-01-24 DIAGNOSIS — R45850 Homicidal ideations: Secondary | ICD-10-CM

## 2014-01-24 DIAGNOSIS — F259 Schizoaffective disorder, unspecified: Secondary | ICD-10-CM | POA: Diagnosis not present

## 2014-01-24 DIAGNOSIS — R45851 Suicidal ideations: Secondary | ICD-10-CM | POA: Diagnosis not present

## 2014-01-24 DIAGNOSIS — F911 Conduct disorder, childhood-onset type: Secondary | ICD-10-CM | POA: Diagnosis not present

## 2014-01-24 MED ORDER — DIVALPROEX SODIUM 500 MG PO DR TAB
500.0000 mg | DELAYED_RELEASE_TABLET | Freq: Two times a day (BID) | ORAL | Status: DC
  Administered 2014-01-24 – 2014-01-25 (×2): 500 mg via ORAL
  Filled 2014-01-24 (×2): qty 1

## 2014-01-24 NOTE — ED Notes (Signed)
Report received from Yolanda RN. Pt. Sleeping, respirations even and unlabored. Will continue to monitor for safety. Q 15 minute checks continue. 

## 2014-01-24 NOTE — Progress Notes (Signed)
Writer faxed referrals to the following facilities with open beds:  [facility], per Catlynn [phone] fax  [place] [facility], per Teranda [phone] Fax  [facility], per Wendy [phone] Fax  [facility], per Tosha [phone] Fax  [facility], per Amy [phone] Fax  [facility], per Pat [phone] Fax   The following facilities are at capacity:  [facility], per Margaret  [facility], Erinn  [facility], per Wendy  [facility], per Carol  [facility], per Susie  [facility], per Christinia  [facility], per Kayla  [facility], per Erinn  [facility], perr Matt  [facility], per Lorey  [facility], per Tracey   No answer  [facility]  [facility]       

## 2014-01-24 NOTE — ED Notes (Signed)
Patient in bed napping. Denies SI, HI, AVH. Denies feelings of anxiety and depression. Contracts for safety on the unit. States that he doesn't see a need for in patient treatment. Patient calm, cooperative at present.   Encouragement offered.  Q 15 safety checks continue.

## 2014-01-24 NOTE — ED Notes (Signed)
Pts. Mom called to offer insight. Can call her at [phone].
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
## 2014-01-25 DIAGNOSIS — F911 Conduct disorder, childhood-onset type: Secondary | ICD-10-CM | POA: Diagnosis not present

## 2014-01-25 DIAGNOSIS — F603 Borderline personality disorder: Secondary | ICD-10-CM

## 2014-01-25 NOTE — [facility] Suicide Risk Assessment (Signed)
Suicide Risk Assessment  Discharge Assessment     Demographic Factors:  Male and Adolescent or young adult  Total Time spent with patient: 30 minutes  Psychiatric Specialty Exam:     Blood pressure 115/69, pulse 77, temperature 98.6 F (37 C), temperature source Oral, resp. rate 16, SpO2 97.00%.There is no weight on file to calculate BMI.  General Appearance: Casual  Eye Contact::  Good  Speech:  Clear and Coherent  Volume:  Normal  Mood:  Euthymic  Affect:  Appropriate  Thought Process:  Coherent and Logical  Orientation:  Full (Time, Place, and Person)  Thought Content:  Negative  Suicidal Thoughts:  No  Homicidal Thoughts:  No  Memory:  Immediate;   Good Recent;   Good Remote;   Good  Judgement:  Intact  Insight:  Fair  Psychomotor Activity:  Normal  Concentration:  Good  Recall:  Good  Fund of Knowledge:Good  Language: Good  Akathisia:  Negative  Handed:  Right  AIMS (if indicated):     Assets:  Communication Skills Desire for Improvement Financial Resources/Insurance Housing Physical Health Social Support  Sleep:       Musculoskeletal: Strength & Muscle Tone: within normal limits Gait & Station: normal Patient leans: N/A   Mental Status Per Nursing Assessment::   On Admission:     Current Mental Status by Physician: NA  Loss Factors: NA  Historical Factors: NA  Risk Reduction Factors:   NA  Continued Clinical Symptoms:  Schizophrenia:   Less than 37 years old  Cognitive Features That Contribute To Risk:  Closed-mindedness    Suicide Risk:  Minimal: No identifiable suicidal ideation.  Patients presenting with no risk factors but with morbid ruminations; may be classified as minimal risk based on the severity of the depressive symptoms  Discharge Diagnoses:   AXIS I:  Schizoaffective Disorder AXIS II:  Deferred AXIS III:   Past Medical History  Diagnosis Date  . Hypertension   . Depression   . Schizophrenia    AXIS IV:   chronic mental illness AXIS V:  61-70 mild symptoms  Plan Of Care/Follow-up recommendations:  Activity:  resume usual activity Diet:  resume usual diet  Is patient on multiple antipsychotic therapies at discharge:  No   Has Patient had three or more failed trials of antipsychotic monotherapy by history:  No  Recommended Plan for Multiple Antipsychotic Therapies: NA    TAYLOR,GERALD D 01/25/2014, 10:29 AM

## 2014-01-25 NOTE — Consult Note (Signed)
Review of Systems   Constitutional: Negative.    HENT: Negative.    Eyes: Negative.    Respiratory: Negative.    Cardiovascular: Negative.    Gastrointestinal: Negative.    Genitourinary: Negative.    Musculoskeletal: Negative.    Skin: Negative.    Neurological: Negative.    Endo/Heme/Allergies: Negative.    Psychiatric/Behavioral: Negative.

## 2014-01-25 NOTE — ED Notes (Signed)
Pt sleeping at present, will monitor for safety. 

## 2014-01-25 NOTE — Consult Note (Signed)
  Psychiatric Specialty Exam: Physical Exam  ROS  Blood pressure 115/69, pulse 77, temperature 98.6 F (37 C), temperature source Oral, resp. rate 16, SpO2 97.00%.There is no weight on file to calculate BMI.  General Appearance: Casual  Eye Contact::  Good  Speech:  Clear and Coherent  Volume:  Normal  Mood:  Euthymic  Affect:  Appropriate  Thought Process:  Coherent and Logical  Orientation:  Full (Time, Place, and Person)  Thought Content:  Negative  Suicidal Thoughts:  No  Homicidal Thoughts:  No  Memory:  Immediate;   Good Recent;   Good Remote;   Good  Judgement:  Intact  Insight:  Good  Psychomotor Activity:  Normal  Concentration:  Good  Recall:  Good  Akathisia:  Negative  Handed:  Right  AIMS (if indicated):     Assets:  Communication Skills Housing Physical Health  Sleep:   good   Mr Vonda Antigua says he had an "anger episode" and threatened himself.  Says he does not remember threatening anyone else.  The episode is over, he denies any suicidal thoughts and does not want to hurt any one else, he says.He has an ACT team, [facility] and will follow up with them when he is discharged.  He denies any hallucinations at the moment and is very appropriate in affec and speech this morning. The plan is to discharge him home to be followed up by the ACT team.

## 2014-08-18 DIAGNOSIS — F319 Bipolar disorder, unspecified: Secondary | ICD-10-CM | POA: Diagnosis not present

## 2014-08-18 DIAGNOSIS — Z113 Encounter for screening for infections with a predominantly sexual mode of transmission: Secondary | ICD-10-CM | POA: Diagnosis not present

## 2014-08-18 DIAGNOSIS — Z0000 Encounter for general adult medical examination without abnormal findings: Secondary | ICD-10-CM | POA: Diagnosis not present

## 2014-08-18 DIAGNOSIS — Z1389 Encounter for screening for other disorder: Secondary | ICD-10-CM | POA: Diagnosis not present

## 2014-08-18 DIAGNOSIS — I10 Essential (primary) hypertension: Secondary | ICD-10-CM | POA: Diagnosis not present

## 2014-08-18 DIAGNOSIS — Z01118 Encounter for examination of ears and hearing with other abnormal findings: Secondary | ICD-10-CM | POA: Diagnosis not present

## 2014-08-18 DIAGNOSIS — Z136 Encounter for screening for cardiovascular disorders: Secondary | ICD-10-CM | POA: Diagnosis not present

## 2014-08-18 DIAGNOSIS — Z131 Encounter for screening for diabetes mellitus: Secondary | ICD-10-CM | POA: Diagnosis not present

## 2014-08-18 DIAGNOSIS — E669 Obesity, unspecified: Secondary | ICD-10-CM | POA: Diagnosis not present

## 2014-08-18 DIAGNOSIS — Z0100 Encounter for examination of eyes and vision without abnormal findings: Secondary | ICD-10-CM | POA: Diagnosis not present

## 2014-08-18 DIAGNOSIS — Z720 Tobacco use: Secondary | ICD-10-CM | POA: Diagnosis not present

## 2014-10-20 ENCOUNTER — Encounter (HOSPITAL_COMMUNITY): Payer: Self-pay

## 2014-10-20 ENCOUNTER — Emergency Department (HOSPITAL_COMMUNITY)
Admission: EM | Admit: 2014-10-20 | Discharge: 2014-10-20 | Disposition: A | Discharge status: Home / Self Care | Payer: Medicare Other | Attending: Emergency Medicine | Admitting: Emergency Medicine

## 2014-10-20 ENCOUNTER — Inpatient Hospital Stay (HOSPITAL_COMMUNITY)
Admission: AD | Admit: 2014-10-20 | Discharge: 2014-10-26 | DRG: 885 | Disposition: A | Discharge status: Home / Self Care | Payer: Medicare Other | Source: Intra-hospital | Attending: Psychiatry | Admitting: Psychiatry

## 2014-10-20 ENCOUNTER — Encounter (HOSPITAL_COMMUNITY): Payer: Self-pay | Admitting: Behavioral Health

## 2014-10-20 DIAGNOSIS — F250 Schizoaffective disorder, bipolar type: Secondary | ICD-10-CM | POA: Diagnosis not present

## 2014-10-20 DIAGNOSIS — Z825 Family history of asthma and other chronic lower respiratory diseases: Secondary | ICD-10-CM | POA: Diagnosis not present

## 2014-10-20 DIAGNOSIS — Z046 Encounter for general psychiatric examination, requested by authority: Secondary | ICD-10-CM | POA: Diagnosis present

## 2014-10-20 DIAGNOSIS — F419 Anxiety disorder, unspecified: Secondary | ICD-10-CM | POA: Diagnosis present

## 2014-10-20 DIAGNOSIS — I10 Essential (primary) hypertension: Secondary | ICD-10-CM | POA: Diagnosis present

## 2014-10-20 DIAGNOSIS — F17200 Nicotine dependence, unspecified, uncomplicated: Secondary | ICD-10-CM | POA: Diagnosis present

## 2014-10-20 DIAGNOSIS — F251 Schizoaffective disorder, depressive type: Secondary | ICD-10-CM | POA: Diagnosis not present

## 2014-10-20 DIAGNOSIS — Z720 Tobacco use: Secondary | ICD-10-CM | POA: Diagnosis not present

## 2014-10-20 DIAGNOSIS — F329 Major depressive disorder, single episode, unspecified: Secondary | ICD-10-CM | POA: Diagnosis not present

## 2014-10-20 DIAGNOSIS — F17210 Nicotine dependence, cigarettes, uncomplicated: Secondary | ICD-10-CM | POA: Diagnosis present

## 2014-10-20 DIAGNOSIS — F209 Schizophrenia, unspecified: Secondary | ICD-10-CM | POA: Diagnosis not present

## 2014-10-20 DIAGNOSIS — F259 Schizoaffective disorder, unspecified: Secondary | ICD-10-CM | POA: Diagnosis present

## 2014-10-20 DIAGNOSIS — F1420 Cocaine dependence, uncomplicated: Secondary | ICD-10-CM | POA: Diagnosis present

## 2014-10-20 DIAGNOSIS — F1220 Cannabis dependence, uncomplicated: Secondary | ICD-10-CM | POA: Diagnosis present

## 2014-10-20 DIAGNOSIS — F1490 Cocaine use, unspecified, uncomplicated: Secondary | ICD-10-CM | POA: Diagnosis not present

## 2014-10-20 DIAGNOSIS — F1290 Cannabis use, unspecified, uncomplicated: Secondary | ICD-10-CM | POA: Diagnosis not present

## 2014-10-20 LAB — CBC
HCT: 45.8 % (ref 39.0–52.0)
Hemoglobin: 16.4 g/dL (ref 13.0–17.0)
MCH: 30.3 pg (ref 26.0–34.0)
MCHC: 35.8 g/dL (ref 30.0–36.0)
MCV: 84.7 fL (ref 78.0–100.0)
Platelets: 303 10*3/uL (ref 150–400)
RBC: 5.41 MIL/uL (ref 4.22–5.81)
RDW: 12 % (ref 11.5–15.5)
WBC: 9.3 10*3/uL (ref 4.0–10.5)

## 2014-10-20 LAB — COMPREHENSIVE METABOLIC PANEL
ALT: 35 U/L (ref 17–63)
AST: 23 U/L (ref 15–41)
Albumin: 4.4 g/dL (ref 3.5–5.0)
Alkaline Phosphatase: 82 U/L (ref 38–126)
Anion gap: 12 (ref 5–15)
BUN: 10 mg/dL (ref 6–20)
CO2: 23 mmol/L (ref 22–32)
Calcium: 9.4 mg/dL (ref 8.9–10.3)
Chloride: 102 mmol/L (ref 101–111)
Creatinine, Ser: 1.01 mg/dL (ref 0.61–1.24)
GFR calc Af Amer: >60 mL/min (ref >60)
GFR calc non Af Amer: >60 mL/min (ref >60)
Glucose, Bld: 223 mg/dL — ABNORMAL HIGH (ref 65–99)
Potassium: 3.5 mmol/L (ref 3.5–5.1)
Sodium: 137 mmol/L (ref 135–145)
Total Bilirubin: 0.6 mg/dL (ref 0.3–1.2)
Total Protein: 8 g/dL (ref 6.5–8.1)

## 2014-10-20 LAB — RAPID URINE DRUG SCREEN, HOSP PERFORMED
Amphetamines: NOT DETECTED
Barbiturates: NOT DETECTED
Benzodiazepines: NOT DETECTED
Cocaine: NOT DETECTED
Opiates: NOT DETECTED
Tetrahydrocannabinol: NOT DETECTED

## 2014-10-20 LAB — ACETAMINOPHEN LEVEL: Acetaminophen (Tylenol), Serum: <10 ug/mL — ABNORMAL LOW (ref 10–30)

## 2014-10-20 LAB — ETHANOL: Alcohol, Ethyl (B): <5 mg/dL (ref <5)

## 2014-10-20 LAB — SALICYLATE LEVEL: Salicylate Lvl: <4 mg/dL (ref 2.8–30.0)

## 2014-10-20 MED ORDER — ALUM & MAG HYDROXIDE-SIMETH 200-200-20 MG/5ML PO SUSP
30.0000 mL | ORAL | Status: DC | PRN
  Administered 2014-10-22 – 2014-10-25 (×2): 30 mL via ORAL
  Filled 2014-10-20 (×2): qty 30

## 2014-10-20 MED ORDER — ACETAMINOPHEN 325 MG PO TABS
650.0000 mg | ORAL_TABLET | Freq: Four times a day (QID) | ORAL | Status: DC | PRN
  Administered 2014-10-25: 650 mg via ORAL
  Filled 2014-10-20: qty 2

## 2014-10-20 MED ORDER — MAGNESIUM HYDROXIDE 400 MG/5ML PO SUSP
30.0000 mL | Freq: Every day | ORAL | Status: DC | PRN
  Administered 2014-10-24: 30 mL via ORAL
  Filled 2014-10-20: qty 30

## 2014-10-20 NOTE — BH Assessment (Signed)
[facility] Progress Note  Per Thurman CoyerEric Kaplan, RN, [facility], [facility]  EDP Raeford RazorStephen Kohut, MD has completed QPE upholding petition.  He and Dahlia ByesJosephine Onuoha, NP concur with this decision.  [facility].  Pt's nurse, Dawnaly, has been notified.  [facility]  Doylene Canninghomas Shawni Volkov, MA Triage Specialist [phone]
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
## 2014-10-20 NOTE — ED Notes (Signed)
[employer] served IVC papers. Patient's mother had IVC papers served. Patient's mother states that the patient was receiving a medication monthly, but was recently changed to every 3 months and patient has been talking to himself, thoughts scrambled, verbally abusive to mother and strangers. Patient's mother also reports that the patient does not like to be touched.

## 2014-10-20 NOTE — Tx Team (Signed)
Initial Interdisciplinary Treatment Plan   PATIENT STRESSORS: Marital or family conflict   PATIENT STRENGTHS: Supportive family/friends   PROBLEM LIST: Problem List/Patient Goals Date to be addressed Date deferred Reason deferred Estimated date of resolution  "Self" 10/20/14                                                      DISCHARGE CRITERIA:  Ability to meet basic life and health needs Adequate post-discharge living arrangements Improved stabilization in mood, thinking, and/or behavior  PRELIMINARY DISCHARGE PLAN: Attend aftercare/continuing care group Attend PHP/IOP  PATIENT/FAMIILY INVOLVEMENT: This treatment plan has been presented to and reviewed with the patient, [name], and/or family member.  The patient and family have been given the opportunity to ask questions and make suggestions.  [name] L 10/20/2014, 6:22 PM
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
## 2014-10-20 NOTE — Progress Notes (Signed)
Pt presents anxious on admit. Pt disorganize and tangential during admission. Pt has inappropriate laughing, flight of ideas, rapid pressured speech, childlike and poor insight. Pt unaware of IVC but is aware that his mom petitioned him. Pt denies suicidal thoughts. Pt denies AVH. Pt reports feeling paranoid but unable to elaborate. Admission completed and data gather from chart due to pt being a poor historian.   Pt  belonging searched, v/s taken and required documents signed.

## 2014-10-20 NOTE — ED Notes (Addendum)
Patient brought in by [employer] and IVC papers have not yet arrived. [employer] unsure of circumstances. Patient has been cooperative with them. Patient states, "Someone says that I need to get some mental help." Patient laughs inappropriately at times. Patient denies SI/HI, auditory or visual hallucinations.

## 2014-10-20 NOTE — ED Notes (Signed)
Patient gave me verbal permission to speak with his mother.  She informed me that he has been declining steadily for months as far as his mental health.  She states the decline has gotten rapidly declining since starting on a new Invega injection that lasts for three months.  She states he did have a problem with drugs in the past but has taken himself off of them.  She states he is on probation for a felony and that makes it very difficult to get accepted into a group home.  She also stated he had been paying rent on a room at a halfway house, but he stopped staying there because he was afraid of people with guns and knives.  She is not sure if this is real or a hallucination, but continues to pay the rent in case he decides to go back.  She states he is not allowed to stay with her full time but sleeps at her house at night and is on the street during the day.  She is also concerned that he has a court date next week and he won't be able to make it to the hearing.  I encouraged her to [facility].  He did sign a release so that we may speak with his mother and I will [facility] with [facility] other paperwork.

## 2014-10-20 NOTE — ED Notes (Signed)
Patient belongings are in locker 26. One bag.

## 2014-10-20 NOTE — ED Notes (Signed)
Patient admitted from the main ED.  He initially refused to speak with us.  He denies auditory or visual hallucinations, but is obviously responding to internal stimuli.  He states he does not take medication and does not know why he is here, only that the police brought him.  He denies thoughts of harm to self or others.  He was oriented to the unit and to the visitation and phone schedules.  He was offered a cold drink and requested a Coke.
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
## 2014-10-21 ENCOUNTER — Encounter (HOSPITAL_COMMUNITY): Payer: Self-pay | Admitting: Psychiatry

## 2014-10-21 DIAGNOSIS — F1290 Cannabis use, unspecified, uncomplicated: Secondary | ICD-10-CM

## 2014-10-21 DIAGNOSIS — F250 Schizoaffective disorder, bipolar type: Secondary | ICD-10-CM | POA: Diagnosis present

## 2014-10-21 DIAGNOSIS — F17200 Nicotine dependence, unspecified, uncomplicated: Secondary | ICD-10-CM | POA: Diagnosis present

## 2014-10-21 DIAGNOSIS — F1490 Cocaine use, unspecified, uncomplicated: Secondary | ICD-10-CM

## 2014-10-21 DIAGNOSIS — F1220 Cannabis dependence, uncomplicated: Secondary | ICD-10-CM | POA: Diagnosis present

## 2014-10-21 DIAGNOSIS — Z720 Tobacco use: Secondary | ICD-10-CM

## 2014-10-21 DIAGNOSIS — F1420 Cocaine dependence, uncomplicated: Secondary | ICD-10-CM | POA: Diagnosis present

## 2014-10-21 MED ORDER — ARIPIPRAZOLE 5 MG PO TABS
5.0000 mg | ORAL_TABLET | ORAL | Status: DC
  Administered 2014-10-21 – 2014-10-25 (×8): 5 mg via ORAL
  Filled 2014-10-21 (×13): qty 1

## 2014-10-21 MED ORDER — BENZTROPINE MESYLATE 0.5 MG PO TABS
0.5000 mg | ORAL_TABLET | ORAL | Status: DC
Start: 2014-10-21 — End: 2014-10-26
  Administered 2014-10-21 – 2014-10-26 (×10): 0.5 mg via ORAL
  Filled 2014-10-21 (×10): qty 1
  Filled 2014-10-21 (×2): qty 28
  Filled 2014-10-21 (×2): qty 1

## 2014-10-21 MED ORDER — OLANZAPINE 10 MG PO TBDP
10.0000 mg | ORAL_TABLET | Freq: Three times a day (TID) | ORAL | Status: DC | PRN
  Administered 2014-10-22 – 2014-10-24 (×2): 10 mg via ORAL
  Filled 2014-10-21 (×3): qty 1

## 2014-10-21 MED ORDER — TRAZODONE HCL 50 MG PO TABS
50.0000 mg | ORAL_TABLET | Freq: Every evening | ORAL | Status: DC | PRN
  Filled 2014-10-21: qty 14

## 2014-10-21 MED ORDER — ZIPRASIDONE MESYLATE 20 MG IM SOLR: 20.0000 mg | INTRAMUSCULAR | Status: DC | PRN

## 2014-10-21 MED ORDER — DIVALPROEX SODIUM ER 500 MG PO TB24
750.0000 mg | ORAL_TABLET | Freq: Every day | ORAL | Status: DC
  Administered 2014-10-21 – 2014-10-24 (×4): 750 mg via ORAL
  Filled 2014-10-21 (×6): qty 1

## 2014-10-21 MED ORDER — LORAZEPAM 1 MG PO TABS
1.0000 mg | ORAL_TABLET | ORAL | Status: AC | PRN
  Administered 2014-10-22: 1 mg via ORAL
  Filled 2014-10-21: qty 1

## 2014-10-21 MED ORDER — HYDROXYZINE HCL 25 MG PO TABS
25.0000 mg | ORAL_TABLET | Freq: Four times a day (QID) | ORAL | Status: DC | PRN
  Administered 2014-10-24: 25 mg via ORAL
  Filled 2014-10-21 (×2): qty 20

## 2014-10-21 NOTE — Progress Notes (Signed)
Patient ID: Riley BonitoJoss Scollard, male   DOB: 07/28/1985, 29 y.o.   MRN: [id number] PER STATE REGULATIONS 482.30  THIS CHART WAS REVIEWED FOR MEDICAL NECESSITY WITH RESPECT TO THE PATIENT'S ADMISSION/DURATION OF STAY.  NEXT REVIEW DATE: 10/24/14  Loura HaltBARBARA Zariya Minner, RN, BSN CASE MANAGER
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
## 2014-10-21 NOTE — Progress Notes (Signed)
The focus of this group is to help patients review their daily goal of treatment and discuss progress on daily workbooks. Pt did not attend the evening group. 

## 2014-10-21 NOTE — Progress Notes (Signed)
Pt presents with depressed mood, affect blunted. Tej has been isolative to his room throughout the am. Upon approach he was in bed , but did answer questions from Clinical research associatewriter. He states '' oh I just didn't sleep good, and I got hot so I turned the temperature down. '' he denies any thoughts of self injury or suicidal ideation. He denies any other acute concerns at this time. Discussed pt progress with Dr. Elna BreslowEappen.  Pt is safe, will continue to monitor q 15 minutes for safety.

## 2014-10-21 NOTE — [facility] Group Notes (Signed)
[facility]   10/21/2014 1:48 PM  Participation Quality:  Invited.  Chose to not attend.  [name], [name] B

## 2014-10-21 NOTE — [facility] Suicide Risk Assessment (Signed)
[facility]   Nursing information obtained from:  Patient Demographic factors:  Male, Low socioeconomic status Current Mental Status:  NA Loss Factors:  Decrease in vocational status Historical Factors:  Family history of mental illness or substance abuse Risk Reduction Factors:  Sense of responsibility to family, Living with another person, especially a relative, Positive social support Total Time spent with patient: 30 minutes Principal Problem: Schizoaffective disorder, bipolar type Diagnosis:   Patient Active Problem List   Diagnosis Date Noted  . Schizoaffective disorder, bipolar type [F25.0] 10/21/2014  . Tobacco use disorder [Z72.0] 10/21/2014  . Cocaine use disorder, moderate, in sustained remission [F14.90] 10/21/2014  . Cannabis use disorder, severe, in sustained remission [F12.90] 10/21/2014     Continued Clinical Symptoms:  Alcohol Use Disorder Identification Test Final Score (AUDIT): 0 The "Alcohol Use Disorders Identification Test", Guidelines for Use in Primary Care, Second Edition.  World Science writerHealth Organization [facility](WHO). Score between 0-7:  no or low risk or alcohol related problems. Score between 8-15:  moderate risk of alcohol related problems. Score between 16-19:  high risk of alcohol related problems. Score 20 or above:  warrants further diagnostic evaluation for alcohol dependence and treatment.   CLINICAL FACTORS:   Previous Psychiatric Diagnoses and Treatments   Musculoskeletal: Strength & Muscle Tone: within normal limits Gait & Station: normal Patient leans: N/A  Psychiatric Specialty Exam: Physical Exam  ROS  Blood pressure 107/57, pulse 80, temperature 98.2 F (36.8 C), temperature source Oral, resp. rate 16, height 5\' 10"  (1.778 m), weight 112.946 kg (249 lb).Body mass index is 35.73 kg/(m^2).              Please see H&P.                                            COGNITIVE FEATURES THAT  CONTRIBUTE TO RISK:  Closed-mindedness, Polarized thinking and Thought constriction (tunnel vision)    SUICIDE RISK:   Moderate:  Frequent suicidal ideation with limited intensity, and duration, some specificity in terms of plans, no associated intent, good self-control, limited dysphoria/symptomatology, some risk factors present, and identifiable protective factors, including available and accessible social support.  PLAN OF CARE:Please see H&P.   Medical Decision Making:  Review of Psycho-Social Stressors (1), Review or order clinical lab tests (1), Review and summation of old records (2), Established Problem, Worsening (2), Review of Last Therapy Session (1), Review of Medication Regimen & Side Effects (2) and Review of New Medication or Change in Dosage (2)  I certify that inpatient services furnished can reasonably be expected to improve the patient's condition.   Marylyn Appenzeller md 10/21/2014, 10:58 AM
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
## 2014-10-21 NOTE — Progress Notes (Signed)
   D: Pt was laying down during the assessment. When asked the circumstances surrounding his adm pt, was reluctant to answer. Writer gave info from report given stating that pt was acting bizarre;  in hopes of getting a response. Pt stated, "to a certain degree. People can say one thing and it gets blown out of proportion." Stated, "I'm not sure if my mom wants me to come back but I have my own place. She's not gonna be ok until she has her space".   A:  Support and encouragement was offered. 15 min checks continued for safety.  R: Pt remains safe.

## 2014-10-21 NOTE — Progress Notes (Signed)
Psychoeducational Group Note  Date:  10/21/2014 Time:  2030  Group Topic/Focus:  Wrap-Up Group:   The focus of this group is to help patients review their daily goal of treatment and discuss progress on daily workbooks.  Participation Level: Did Not Attend  Participation Quality:  Not Applicable  Affect:  Not Applicable  Cognitive:  Not Applicable  Insight:  Not Applicable  Engagement in Group: Not Applicable  Additional Comments:  The patient did not attend group this evening despite being encouraged to do so.   Jassmin Kemmerer S 10/21/2014, 8:30 PM

## 2014-10-21 NOTE — [facility] Group Notes (Signed)
[facility] LCSW Group Therapy  10/21/2014  1:05 PM  Type of Therapy:  Group therapy  Participation Level:  Invited.  Was in bed asleep  Summary of Progress/Problems:  Chaplain was here to lead a group on themes of hope and courage.  Daryel Geraldorth, Nashiya Disbrow B 10/21/2014 1:49 PM

## 2014-10-22 DIAGNOSIS — F250 Schizoaffective disorder, bipolar type: Principal | ICD-10-CM

## 2014-10-22 NOTE — Progress Notes (Signed)
[facility] MD Progress Note  10/22/2014 1:43 PM [name]  MRN:  [id number] Subjective:   Patient states "I'm not sure why I am here. I was hoping that you could tell me. Maybe I am here to get my life together. I know that people have personal vendettas against me and want to get me back. I am really not in the mood for questions. You can read some paperwork over there."   Objective:  Patient is seen and chart is reviewed. [name] is a 29 year old male who was placed under IVC by his mother for unpredictable behaviors such as becoming aggressive towards his mother and has been observed talking to someone was not there. He has a history of admissions to [facility], [facility], and [facility] in the past. Has a history of past cocaine and cannabis abuse but his urine drug screen is currently negative. The patient is noted to be a poor historian during assessment yesterday and today. He has poor insight into his mental illness and is unable to explain the events that resulted in his admission. The patient became mildly agitated as questions were being asked of him and referred writer to his chart for information. Information requested by Dr. Elna Breslow indicates that [name] is followed by an [facility] ACT team outside the hospital and has a history of noncompliance with oral medications resulting in him being on past injectables. The patient has a history of aggression and psychotic symptoms. So far patient has been compliant with his psychiatric medications while in the hospital with no reported adverse reactions.   Principal Problem: Schizoaffective disorder, bipolar type Diagnosis:   Patient Active Problem List   Diagnosis Date Noted  . Schizoaffective disorder, bipolar type [F25.0] 10/21/2014  . Tobacco use disorder [Z72.0] 10/21/2014  . Cocaine use disorder, moderate, in sustained remission [F14.90] 10/21/2014  . Cannabis use disorder, severe, in sustained remission [F12.90] 10/21/2014   Total Time  spent with patient: 20 minutes  Past Medical History:  Past Medical History  Diagnosis Date  . Hypertension   . Depression   . Schizophrenia     Past Surgical History  Procedure Laterality Date  . Back surgery     Family History:  Family History  Problem Relation Age of Onset  . Allergies Mother   . Asthma Mother    Social History:  History  Alcohol Use No     History  Drug Use Not on file    History   Social History  . Marital Status: Single    Spouse Name: N/A  . Number of Children: 0  . Years of Education: N/A   Occupational History  . on disability    Social History Main Topics  . Smoking status: Current Every Day Smoker -- 1.00 packs/day for 8 years    Types: Cigarettes  . Smokeless tobacco: Not on file  . Alcohol Use: No  . Drug Use: Not on file  . Sexual Activity: Not Currently   Other Topics Concern  . None   Social History Narrative   Single. No children. Lives with mother.    Additional History:    Sleep: Fair  Appetite:  Good  Assessment: [name] is a 29 year old male with a history of Schizoaffective Disorder, aggression and is followed by an ACT team. He has required injectable medications in the past such as Invega for mood stabilization.   Musculoskeletal: Strength & Muscle Tone: within normal limits Gait & Station: normal Patient leans: N/A  Psychiatric Specialty Exam: Physical Exam  Review of Systems  Constitutional: Negative.   HENT: Negative.   Eyes: Negative.   Respiratory: Negative.   Cardiovascular: Negative.   Gastrointestinal: Negative.   Genitourinary: Negative.   Musculoskeletal: Negative.   Skin: Negative.   Neurological: Negative.   Endo/Heme/Allergies: Negative.   Psychiatric/Behavioral: Positive for depression. The patient is nervous/anxious and has insomnia.     Blood pressure 136/75, pulse 91, temperature 98.6 F (37 C), temperature source Oral, resp. rate 16, height 5\' 10"  (1.778 m), weight  112.946 kg (249 lb).Body mass index is 35.73 kg/(m^2).  General Appearance: Disheveled  Eye SolicitorContact::  Fair  Speech:  Slow  Volume:  Decreased  Mood:  Irritable  Affect:  Anxious   Thought Process:  Disorganized  Orientation:  Full (Time, Place, and Person)  Thought Content:  Paranoid Ideation  Suicidal Thoughts:  No  Homicidal Thoughts:  No  Memory:  Immediate;   Fair Recent;   Poor Remote;   Poor  Judgement:  Poor  Insight:  Lacking  Psychomotor Activity:  Decreased  Concentration:  Poor  Recall:  Poor  Fund of Knowledge:Poor  Language: Fair  Akathisia:  No  Handed:  Right  AIMS (if indicated):     Assets:  Housing Intimacy Physical Health Social Support  ADL's:  Impaired body odor present   Cognition: WNL  Sleep:  Number of Hours: 5   Current Medications: Current Facility-Administered Medications  Medication Dose Route Frequency Provider Last Rate Last Dose  . acetaminophen (TYLENOL) tablet 650 mg  650 mg Oral Q6H PRN Earney NavyJosephine C Onuoha, NP      . alum & mag hydroxide-simeth (MAALOX/MYLANTA) 200-200-20 MG/5ML suspension 30 mL  30 mL Oral Q4H PRN Earney NavyJosephine C Onuoha, NP      . ARIPiprazole (ABILIFY) tablet 5 mg  5 mg Oral BH-qamhs Saramma Eappen, MD   5 mg at 10/22/14 0800  . benztropine (COGENTIN) tablet 0.5 mg  0.5 mg Oral BH-qamhs Saramma Eappen, MD   0.5 mg at 10/22/14 0800  . divalproex (DEPAKOTE ER) 24 hr tablet 750 mg  750 mg Oral QHS Jomarie LongsSaramma Eappen, MD   750 mg at 10/21/14 2235  . hydrOXYzine (ATARAX/VISTARIL) tablet 25 mg  25 mg Oral Q6H PRN Jomarie LongsSaramma Eappen, MD      . magnesium hydroxide (MILK OF MAGNESIA) suspension 30 mL  30 mL Oral Daily PRN Earney NavyJosephine C Onuoha, NP      . OLANZapine zydis (ZYPREXA) disintegrating tablet 10 mg  10 mg Oral Q8H PRN Jomarie LongsSaramma Eappen, MD   10 mg at 10/22/14 1329   And  . ziprasidone (GEODON) injection 20 mg  20 mg Intramuscular PRN Jomarie LongsSaramma Eappen, MD      . traZODone (DESYREL) tablet 50 mg  50 mg Oral QHS PRN Jomarie LongsSaramma Eappen, MD         Lab Results:  Results for orders placed or performed during the hospital encounter of 10/20/14 (from the past 48 hour(s))  Urine Drug Screen     Status: None   Collection Time: 10/20/14  1:49 PM  Result Value Ref Range   Opiates NONE DETECTED NONE DETECTED   Cocaine NONE DETECTED NONE DETECTED   Benzodiazepines NONE DETECTED NONE DETECTED   Amphetamines NONE DETECTED NONE DETECTED   Tetrahydrocannabinol NONE DETECTED NONE DETECTED   Barbiturates NONE DETECTED NONE DETECTED    Comment:        DRUG SCREEN FOR MEDICAL PURPOSES ONLY.  IF CONFIRMATION IS NEEDED FOR ANY PURPOSE, NOTIFY  LAB WITHIN 5 DAYS.        LOWEST DETECTABLE LIMITS FOR URINE DRUG SCREEN Drug Class       Cutoff (ng/mL) Amphetamine      1000 Barbiturate      200 Benzodiazepine   200 Tricyclics       300 Opiates          300 Cocaine          300 THC              50     Physical Findings: AIMS: Facial and Oral Movements Muscles of Facial Expression: None, normal Lips and Perioral Area: None, normal Jaw: None, normal Tongue: None, normal,Extremity Movements Upper (arms, wrists, hands, fingers): None, normal Lower (legs, knees, ankles, toes): None, normal, Trunk Movements Neck, shoulders, hips: None, normal, Overall Severity Severity of abnormal movements (highest score from questions above): None, normal Incapacitation due to abnormal movements: None, normal Patient's awareness of abnormal movements (rate only patient's report): No Awareness, Dental Status Current problems with teeth and/or dentures?: No Does patient usually wear dentures?: No  CIWA:    COWS:     Treatment Plan Summary: Daily contact with patient to assess and evaluate symptoms and progress in treatment and Medication management  Continue crisis management and stabilization.  Medication management:  Continue Abilify every am and hs for psychosis Continue Cogentin 0.5 mg every am and hs for EPS prevention Continue Depakote ER 750  mg at bedtime for improved mood stability. Encouraged patient to attend groups and participate in group counseling sessions and activities.  Discharge plan in progress.  Continue current treatment plan.  Address health issues: Vitals reviewed and stable.  Contact his ACT team for further history and treatment. Will likely need to be started on injectable medication prior to his discharge.   Medical Decision Making:  Review of Psycho-Social Stressors (1), Review or order clinical lab tests (1), Order AIMS Test (2), Established Problem, Worsening (2), Review of Last Therapy Session (1), Review of Medication Regimen & Side Effects (2) and Review of New Medication or Change in Dosage (2)  DAVIS, LAURA NP-C 10/22/2014, 1:43 PM  Reviewed the information documented and agree with the treatment plan.  Azaya Goedde,JANARDHAHA R. 10/23/2014 4:56 PM

## 2014-10-22 NOTE — Progress Notes (Signed)
Psychoeducational Group Note  Date:  10/22/2014 Time:  2111  Group Topic/Focus:  Wrap-Up Group:   The focus of this group is to help patients review their daily goal of treatment and discuss progress on daily workbooks.  Participation Level: Did Not Attend  Participation Quality:  Not Applicable  Affect:  Not Applicable  Cognitive:  Not Applicable  Insight:  Not Applicable  Engagement in Group: Not Applicable  Additional Comments:  The patient did not attend group despite being encouraged to do so. The patient woke up,made eye contact with this author, and then returned to sleep.   Hazle CocaGOODMAN, Kaeli Nichelson S 10/22/2014, 9:11 PM

## 2014-10-22 NOTE — Progress Notes (Signed)
[name] is seen on the 500 hall today.he is actively responding to some type of internal stimuli not visible to this Clinical research associatewriter. HE takes his scheduled meds as planned. HE avoids eye contact, he is irritable, agitated and out of sorts...mumbling incoherently as this writer walks up to greet him again.   A HE is given a prn dose of 1 mg ativan po and 10 mg zyprexa zydis and is observed sleeping in his bed about 30 min later.   R Safety is in place .

## 2014-10-22 NOTE — Progress Notes (Addendum)
[name] has spent most of his day sleeping in his bed..influenza his room. He is  Quiet. HE is obviously guarded, paranoid and uncomfortable. He is seen standing in the doorway of his room...looking up and down the hall. HE then walks around his room a little..looks out of the window, goes to the bathroom and then climbs back into his bed ad goes to sleep.   A He denies active SI . HE is encouraged to complete his daily assessment sheet and he says " yeah.....oh, yeah.... " when this nurse presses him  to complete.    R Safety is in place and poc cont.

## 2014-10-22 NOTE — Progress Notes (Signed)
D: Pt was laying in bed during the assessment. Staff attempted to get pt to attend group however, pt refused. Pt stated,  "I'll start going to as many groups as I can tomorrow. I'm just tired.". However, pt stated he had a "good day". When asked about the conversation with his provider, pt stated, "She had questions, I didn't have any answers".   A:  Support and encouragement was offered. 15 min checks continued for safety.  R: Pt remains safe.

## 2014-10-22 NOTE — [facility] Group Notes (Signed)
[facility] Group Notes:  (Clinical Social Work)  10/22/2014  11:15-12:00PM  Summary of Progress/Problems:   The main focus of today's process group was to discuss patients' feelings related to being hospitalized, as well as the difference between "being" and "having" a mental health diagnosis.  It was agreed in general by the group that it would be preferable to avoid future hospitalizations, and we discussed means of doing that.  As a follow-up, problems with adhering to medication recommendations were discussed.  The patient expressed their primary feeling about being hospitalized is that he is "ready to go."  He stated he does not know how he feels or if it has helped to be in the hospital, but he feels "trapped in my own life" and needs to get out.  He was in and out of the room several times during group.  Type of Therapy:  Group Therapy - Process  Participation Level:  Minimal  Participation Quality:  Inattentive  Affect:  Flat and Resistant  Cognitive:  Disorganized  Insight:  Limited  Engagement in Therapy:  Limited  Modes of Intervention:  Exploration, Discussion  Ambrose MantleMareida Grossman-Orr, LCSW 10/22/2014, 1:04 PM

## 2014-10-23 NOTE — Progress Notes (Signed)
Patient ID: [name], male   DOB: 01/05/1986, 29 y.o.   MRN: [id number] [facility]  10/23/2014 5:51 PM [name]  MRN:  [id number]   Subjective:  Patient states "You people and your questions. I don't really know. I never get a chance to talk. Have a great day."   Objective:  Patient is seen and chart is reviewed. Oluwatobi is a 29 year old male who was placed under IVC by his mother for unpredictable behaviors such as becoming aggressive towards his mother and has been observed talking to someone was not there. He has a [facility], and [facility] in the past. Has a history of past cocaine and cannabis abuse but his urine drug screen is currently negative. The patient is noted to be a poor historian during assessments.  He has poor insight into his mental illness and is unable to explain the events that resulted in his admission. The patient becomes irritable as questions are asked of him and referred writer to his chart for information. Information requested by Dr. Elna BreslowEappen indicates that Yosmar is followed by an [facility] ACT team outside the hospital and has a history of noncompliance with oral medications resulting in him being on past injectables. The patient has a history of aggression and psychotic symptoms. So far patient has been compliant with his psychiatric medications while in the hospital with no reported adverse reactions. Nursing staff report patient has been isolating in his room and not attending groups. He is also not showering when prompted even when he acknowledge to staff that he had body odor. Hines has no insight into his mental illness and appears poorly motivated for treatment.   Principal Problem: Schizoaffective disorder, bipolar type Diagnosis:   Patient Active Problem List   Diagnosis Date Noted  . Schizoaffective disorder, bipolar type [F25.0] 10/21/2014  . Tobacco use disorder [Z72.0] 10/21/2014  . Cocaine use  disorder, moderate, in sustained remission [F14.90] 10/21/2014  . Cannabis use disorder, severe, in sustained remission [F12.90] 10/21/2014   Total Time spent with patient: 20 minutes  Past Medical History:  Past Medical History  Diagnosis Date  . Hypertension   . Depression   . Schizophrenia     Past Surgical History  Procedure Laterality Date  . Back surgery     Family History:  Family History  Problem Relation Age of Onset  . Allergies Mother   . Asthma Mother    Social History:  History  Alcohol Use No     History  Drug Use Not on file    History   Social History  . Marital Status: Single    Spouse Name: N/A  . Number of Children: 0  . Years of Education: N/A   Occupational History  . on disability    Social History Main Topics  . Smoking status: Current Every Day Smoker -- 1.00 packs/day for 8 years    Types: Cigarettes  . Smokeless tobacco: Not on file  . Alcohol Use: No  . Drug Use: Not on file  . Sexual Activity: Not Currently   Other Topics Concern  . None   Social History Narrative   Single. No children. Lives with mother.    Additional History:    Sleep: Fair  Appetite:  Good  Assessment: [name] is a 29 year old male with a history of Schizoaffective Disorder, aggression and is followed by an ACT team. He has required injectable medications in the past  such as Invega for mood stabilization.   Musculoskeletal: Strength & Muscle Tone: within normal limits Gait & Station: normal Patient leans: N/A  Psychiatric Specialty Exam: Physical Exam  Review of Systems  Constitutional: Negative.   HENT: Negative.   Eyes: Negative.   Respiratory: Negative.   Cardiovascular: Negative.   Gastrointestinal: Negative.   Genitourinary: Negative.   Musculoskeletal: Negative.   Skin: Negative.   Neurological: Negative.   Endo/Heme/Allergies: Negative.   Psychiatric/Behavioral: Negative for hallucinations. The patient is nervous/anxious and  has insomnia.        Paranoia     Blood pressure 130/80, pulse 80, temperature 98.6 F (37 C), temperature source Oral, resp. rate 16, height  (1.778 m), weight 112.946 kg (249 lb).Body mass index is 35.73 kg/(m^2).  General Appearance: Disheveled  Eye Solicitor::  Fair  Speech:  Slow  Volume:  Decreased  Mood:  Irritable  Affect:  Anxious   Thought Process:  Disorganized  Orientation:  Full (Time, Place, and Person)  Thought Content:  Paranoid Ideation  Suicidal Thoughts:  No  Homicidal Thoughts:  No  Memory:  Immediate;   Fair Recent;   Poor Remote;   Poor  Judgement:  Poor  Insight:  Lacking  Psychomotor Activity:  Decreased  Concentration:  Poor  Recall:  Poor  Fund of Knowledge:Poor  Language: Fair  Akathisia:  No  Handed:  Right  AIMS (if indicated):     Assets:  Housing Intimacy Physical Health Social Support  ADL's:  Impaired body odor present   Cognition: WNL  Sleep:  Number of Hours: 6.25   Current Medications: Current Facility-Administered Medications  Medication Dose Route Frequency Provider Last Rate Last Dose  . acetaminophen (TYLENOL) tablet 650 mg  650 mg Oral Q6H PRN Earney Navy, NP      . alum & mag hydroxide-simeth (MAALOX/MYLANTA) 200-200-20 MG/5ML suspension 30 mL  30 mL Oral Q4H PRN Earney Navy, NP   30 mL at 10/22/14 1809  . ARIPiprazole (ABILIFY) tablet 5 mg  5 mg Oral BH-qamhs Saramma Eappen, MD   5 mg at 10/23/14 0800  . benztropine (COGENTIN) tablet 0.5 mg  0.5 mg Oral BH-qamhs Saramma Eappen, MD   0.5 mg at 10/23/14 0800  . divalproex (DEPAKOTE ER) 24 hr tablet 750 mg  750 mg Oral QHS Jomarie Longs, MD   750 mg at 10/22/14 2155  . hydrOXYzine (ATARAX/VISTARIL) tablet 25 mg  25 mg Oral Q6H PRN Jomarie Longs, MD      . magnesium hydroxide (MILK OF MAGNESIA) suspension 30 mL  30 mL Oral Daily PRN Earney Navy, NP      . OLANZapine zydis (ZYPREXA) disintegrating tablet 10 mg  10 mg Oral Q8H PRN Jomarie Longs, MD   10 mg  at 10/22/14 1329   And  . ziprasidone (GEODON) injection 20 mg  20 mg Intramuscular PRN Jomarie Longs, MD      . traZODone (DESYREL) tablet 50 mg  50 mg Oral QHS PRN Jomarie Longs, MD        Lab Results:  No results found for this or any previous visit (from the past 48 hour(s)).  Physical Findings: AIMS: Facial and Oral Movements Muscles of Facial Expression: None, normal Lips and Perioral Area: None, normal Jaw: None, normal Tongue: None, normal,Extremity Movements Upper (arms, wrists, hands, fingers): None, normal Lower (legs, knees, ankles, toes): None, normal, Trunk Movements Neck, shoulders, hips: None, normal, Overall Severity Severity of abnormal movements (highest score from questions above):  None, normal Incapacitation due to abnormal movements: None, normal Patient's awareness of abnormal movements (rate only patient's report): No Awareness, Dental Status Current problems with teeth and/or dentures?: No Does patient usually wear dentures?: No  CIWA:    COWS:     Treatment Plan Summary: Daily contact with patient to assess and evaluate symptoms and progress in treatment and Medication management  Continue crisis management and stabilization.  Medication management:  Continue Abilify every am and hs for psychosis Continue Cogentin 0.5 mg every am and hs for EPS prevention Continue Depakote ER 750 mg at bedtime for improved mood stability with level scheduled for 10/25/14. Encouraged patient to attend groups and participate in group counseling sessions and activities.  Discharge plan in progress.  Continue current treatment plan.  Address health issues: Vitals reviewed and stable.  Contact his ACT team for further history and treatment.  Will likely need to be started on injectable medication prior to his discharge.   Medical Decision Making:  Review of Psycho-Social Stressors (1), Review or order clinical lab tests (1), Order AIMS Test (2), Established Problem,  Worsening (2), Review of Last Therapy Session (1), Review of Medication Regimen & Side Effects (2) and Review of New Medication or Change in Dosage (2)  DAVIS, LAURA NP-C 10/23/2014, 5:51 PM  Reviewed the information documented and agree with the treatment plan.  Shain Pauwels,JANARDHAHA R. 10/24/2014 9:35 AM

## 2014-10-23 NOTE — Progress Notes (Signed)
D: Pt was laying in bed during the assessment. Pt appeared to be more alert today previous days. Stated, "I slept good". Writer attempted to encourage pt to attend groups and bath. Pt lifted his arm and smelled himself. Stated, "yeah I need to". However, pt didn't attend group or bathe.   A:  Support and encouragement was offered. 15 min checks continued for safety.  R: Pt remains safe.

## 2014-10-23 NOTE — Progress Notes (Signed)
[name]  Has spent most of the day today resting in his bed. When he gets up..he can be irritable, labile and wanting to go home. He does, however, take his medications  as planned and he is compliant in terms of the unit rules. HE is still disorganized, has diff processing and understanding .   A He is adamant that he is going home to day and got somewhat agitated when this writer explained to him that he is not scheduled to be discharged. His mother, [name], brought in a list of all medications Jarad has taken in the past and requests Dr. Elna BreslowEappen read this. List placed in Renji's chart.   R Safety in place.

## 2014-10-23 NOTE — Progress Notes (Signed)
D: Patient in bed asleep approach.  Patient states he has been in bed all day.  Patient states he has eaten some today.  Patient does not seem vested in care.  Patient does appears suspicious and paranoid.  Patient denies SI/HI and denies AVH.   A: Staff to monitor Q 15 mins for safety.  Encouragement and support offered.  Scheduled medications administered per orders.  No prn medications administered. R: Patient remains safe on the unit.  Patient did not attend group tonight.  Patient not visible on the unit and not interacting with peers.  Patient taking administered medications.

## 2014-10-23 NOTE — [facility] Group Notes (Signed)
[facility] Group Notes:  (Clinical Social Work)  10/23/2014  [facility] Group Notes:  (Clinical Social Work)  10/23/2014  11:00AM-12:00PM  Summary of Progress/Problems:  The main focus of today's process group was to listen to a variety of genres of music and to identify that different types of music provoke different responses.  The patient then was able to identify personally what was soothing for them, as well as energizing.  Handouts were used to record feelings evoked, as well as how patient can personally use this knowledge in sleep habits, with depression, and with other symptoms.  The patient expressed understanding of concepts, as well as knowledge of how each type of music affected him/her and how this can be used at home as a wellness/recovery tool.  He appeared to enjoy much of the music, was up and down, in and out of the room numerous times, was malodorous.  Type of Therapy:  Music Therapy   Participation Level:  Active  Participation Quality:  Attentive and Sharing  Affect:  Blunted  Cognitive:  Oriented  Insight:  Engaged  Engagement in Therapy:  Engaged  Modes of Intervention:   Activity, Exploration  Riley MantleMareida Grossman-Orr, LCSW 10/23/2014

## 2014-10-23 NOTE — Progress Notes (Signed)
Psychoeducational Group Note  Date:  10/23/2014 Time:  2101  Group Topic/Focus:  Wrap-Up Group:   The focus of this group is to help patients review their daily goal of treatment and discuss progress on daily workbooks.  Participation Level: Did Not Attend  Participation Quality:  Not Applicable  Affect:  Not Applicable  Cognitive:  Not Applicable  Insight:  Not Applicable  Engagement in Group: Not Applicable  Additional Comments:  The patient did not attend group this evening despite being encouraged to do so.   Emanuele Mcwhirter S 10/23/2014, 9:01 PM

## 2014-10-24 MED ORDER — ARIPIPRAZOLE ER 400 MG IM SUSR
400.0000 mg | INTRAMUSCULAR | Status: DC
  Filled 2014-10-24: qty 400

## 2014-10-24 MED ORDER — ARIPIPRAZOLE ER 400 MG IM SUSR
400.0000 mg | INTRAMUSCULAR | Status: DC
  Administered 2014-10-25: 400 mg via INTRAMUSCULAR

## 2014-10-24 NOTE — Progress Notes (Signed)
Pt responding to internal stimuli, paranoid stating to writer '' you keep getting ready to ask me things before I can get them out. Oh look at your fingers, you could save the world with those hands, have you seen that nurse, she looks like is going to kick some ass, i am afraid of her. '' Pt offered prn for anxiety and agitation (see EMAR) zyprexa and vistaril given. Pt accepted without issue. Will continue to monitor and redirect as needed.

## 2014-10-24 NOTE — Progress Notes (Signed)
D: Pt appearance disheveled. Behavior calm, but slow to respond on approach. "You are asking to many questions." Disorganized thought process. Thought blocking, has difficulty answering question at hand. He denies SI/HI. He also denies A/V/H. Observed in dayroom with peers. He also denies any physical pain.  A: Medication administered per MD order (see eMAR). Special checks q 15 mins in place for safety. Encouragement and counseling provided.   R: Compliant with medication regimen. Safety maintained on unit.

## 2014-10-24 NOTE — Progress Notes (Signed)
Patient ID: Jerral BonitoJoss Vanostrand, male   DOB: 02/03/1986, 29 y.o.   MRN: [id number] PER STATE REGULATIONS 482.30  THIS CHART WAS REVIEWED FOR MEDICAL NECESSITY WITH RESPECT TO THE PATIENT'S ADMISSION/ DURATION OF STAY.  NEXT REVIEW DATE: 10/28/2014  Willa RoughJENNIFER JONES Jared Cahn, RN, BSN CASE MANAGER

## 2014-10-24 NOTE — Progress Notes (Signed)
D   Pt appears to respond to internal stimuli and having intrusive thoughts   He requested information about being discharged   He was fidgety and had a hard time standing at the medication window long enough to get his medications   He was observed by staff in his room talking to himself and when a new pt came to be his roommate he was completely focused on his conversation with someone who wasn't there that the new roommate refused to stay in the room with him    Pt did take his medications and attended group A   Verbal support given   Medications administered and effectiveness monitored   Q 15 min checks  Redirect as needed R    Pt safe at present

## 2014-10-24 NOTE — [facility] Suicide Risk Assessment (Signed)
[facility] INPATIENT:  Family/Significant Other Suicide Prevention Education  Suicide Prevention Education:  Patient Refusal for Family/Significant Other Suicide Prevention Education: The patient [name] has refused to provide written consent for family/significant other to be provided Family/Significant Other Suicide Prevention Education during admission and/or prior to discharge.  Physician notified.  Daryel Geraldorth, [name] B 10/24/2014, 5:07 PM

## 2014-10-24 NOTE — Progress Notes (Signed)
Patient ID: Jerral BonitoJoss Frieson, male   DOB: 04/13/1986, 29 y.o.   [id number] [facility]  10/24/2014 12:59 PM Tymeir Dyanne IhaBernardino  MRN:  [id number]   Subjective:  Patient states "I am ok , I have been taking my medications.'   Objective:  Patient is seen and chart is reviewed. Cassey is a 29 year old male who was placed under IVC by his mother for unpredictable behaviors such as becoming aggressive towards his mother and has been observed talking to someone was not there. He [facility] a [facility], [facility], and [facility] in the past. Has a history of past cocaine and cannabis abuse but his urine drug screen is currently negative.  I have reviewed notes in EHR the past two days.  Patient is a limited historian. Pt states he has been tolerating his medications well.  Per staff patient continues to have thought blocking , is disorganized as well as disheveled - needs a lot of encouragement to take a shower , change his clothes.  He has been attending groups .  [facility]- he decompensated recently after being switched to [place] Trin[place].Hence abilify was started . Plan to DC on Abilify maintena .  Patient tolerating Depakote well, could increase dose higher- will await Depakote level - due tomorrow AM.     Principal Problem: Schizoaffective disorder, bipolar type, Multiple episodes, currently in acute episode Diagnosis:   Patient Active Problem List   Diagnosis Date Noted  . Schizoaffective disorder, bipolar type [F25.0] 10/21/2014  . Tobacco use disorder [Z72.0] 10/21/2014  . Cocaine use disorder, moderate, in sustained remission [F14.90] 10/21/2014  . Cannabis use disorder, severe, in sustained remission [F12.90] 10/21/2014   Total Time spent with patient: 30 minutes  Past Medical History:  Past Medical History  Diagnosis Date  . Hypertension   . Depression   . Schizophrenia     Past Surgical History  Procedure  Laterality Date  . Back surgery     Family History:  Family History  Problem Relation Age of Onset  . Allergies Mother   . Asthma Mother    Social History:  History  Alcohol Use No     History  Drug Use Not on file    History   Social History  . Marital Status: Single    Spouse Name: N/A  . Number of Children: 0  . Years of Education: N/A   Occupational History  . on disability    Social History Main Topics  . Smoking status: Current Every Day Smoker -- 1.00 packs/day for 8 years    Types: Cigarettes  . Smokeless tobacco: Not on file  . Alcohol Use: No  . Drug Use: Not on file  . Sexual Activity: Not Currently   Other Topics Concern  . None   Social History Narrative   Single. No children. Lives with mother.    Additional History:    Sleep: Fair  Appetite:  Good  Musculoskeletal: Strength & Muscle Tone: within normal limits Gait & Station: normal Patient leans: N/A  Psychiatric Specialty Exam: Physical Exam  Review of Systems  Psychiatric/Behavioral: Positive for hallucinations. The patient is nervous/anxious.   All other systems reviewed and are negative.   Blood pressure 115/75, pulse 89, temperature 98.2 F (36.8 C), temperature source Oral, resp. rate 18, height 5\' 10"  (1.778 m), weight 112.946 kg (249 lb).Body mass index is 35.73 kg/(m^2).  General Appearance: Disheveled has stains all  over his clothes , his room has clothes, towels and blankets thrown all over the floor  Eye Contact::  Fair  Speech:  Slow  Volume:  Normal  Mood:  Irritable with improvement  Affect:  Anxious   Thought Process:  Disorganized  Orientation:  Full (Time, Place, and Person)  Thought Content:  Paranoid Ideation  Suicidal Thoughts:  No  Homicidal Thoughts:  No  Memory:  Immediate;   Fair Recent;   Fair Remote;   Fair  Judgement:  Poor  Insight:  Lacking  Psychomotor Activity:  Restlessness  Concentration:  Poor  Recall:  Poor  Fund of Knowledge:Poor   Language: Fair  Akathisia:  No  Handed:  Right  AIMS (if indicated):     Assets:  Housing Intimacy Physical Health Social Support  ADL's:  Impaired body odor present   Cognition: WNL  Sleep:  Number of Hours: 6.75   Current Medications: Current Facility-Administered Medications  Medication Dose Route Frequency Provider Last Rate Last Dose  . acetaminophen (TYLENOL) tablet 650 mg  650 mg Oral Q6H PRN Earney Navy, NP      . alum & mag hydroxide-simeth (MAALOX/MYLANTA) 200-200-20 MG/5ML suspension 30 mL  30 mL Oral Q4H PRN Earney Navy, NP   30 mL at 10/22/14 1809  . ARIPiprazole (ABILIFY) tablet 5 mg  5 mg Oral BH-qamhs Krishav Mamone, MD   5 mg at 10/24/14 0754  . benztropine (COGENTIN) tablet 0.5 mg  0.5 mg Oral BH-qamhs Tinita Brooker, MD   0.5 mg at 10/24/14 0755  . divalproex (DEPAKOTE ER) 24 hr tablet 750 mg  750 mg Oral QHS Jomarie Longs, MD   750 mg at 10/23/14 2145  . hydrOXYzine (ATARAX/VISTARIL) tablet 25 mg  25 mg Oral Q6H PRN Jomarie Longs, MD      . magnesium hydroxide (MILK OF MAGNESIA) suspension 30 mL  30 mL Oral Daily PRN Earney Navy, NP      . OLANZapine zydis (ZYPREXA) disintegrating tablet 10 mg  10 mg Oral Q8H PRN Jomarie Longs, MD   10 mg at 10/22/14 1329   And  . ziprasidone (GEODON) injection 20 mg  20 mg Intramuscular PRN Jomarie Longs, MD      . traZODone (DESYREL) tablet 50 mg  50 mg Oral QHS PRN Jomarie Longs, MD        Lab Results:  No results found for this or any previous visit (from the past 48 hour(s)).  Physical Findings: AIMS: Facial and Oral Movements Muscles of Facial Expression: None, normal Lips and Perioral Area: None, normal Jaw: None, normal Tongue: None, normal,Extremity Movements Upper (arms, wrists, hands, fingers): None, normal Lower (legs, knees, ankles, toes): None, normal, Trunk Movements Neck, shoulders, hips: None, normal, Overall Severity Severity of abnormal movements (highest score from questions  above): None, normal Incapacitation due to abnormal movements: None, normal Patient's awareness of abnormal movements (rate only patient's report): No Awareness, Dental Status Current problems with teeth and/or dentures?: No Does patient usually wear dentures?: No  CIWA:    COWS:     Assessment: Cheng Noone is a 29 year old  hispanic male with a history of Schizoaffective Disorder. Patient presented after he decompensated due to his medication -Invega sustenna being switched to [place] recently. Pt also with hx of polysubstance abuse- his UDS negative this time. Pt however has stressors of pending court hearing - June 9 th , as well as was not comfortable at his transition home and hence was sleeping  at his mother's place. Pt tolerating abilify and depakote well. Depakote level tomorrow AM. Plan to DC on Abilify Maintena IM 400 mg . Pt will continue to need inpatient stay.       Treatment Plan Summary: Daily contact with patient to assess and evaluate symptoms and progress in treatment and Medication management  Continue crisis management and stabilization.  Medication management:  Continue Abilify 5 mg po  every am and hs for psychosis. Pt denies any ADRs.  Continue Cogentin 0.5 mg every am and hs for EPS prevention. Continue Trazodone 50 mg po qhs prn for sleep. Continue Depakote ER 750 mg at bedtime for improved mood stability with level scheduled for 10/25/14. Encouraged patient to attend groups and participate in group counseling sessions and activities.  Discharge plan in progress.  Continue current treatment plan.  Reviewed EKG- 10/21/14-NSR-QTC-WNL. Address health issues: Vitals reviewed and stable.  CSW will work on disposition.  Medical Decision Making:  Review of Psycho-Social Stressors (1), Review or order clinical lab tests (1), Review of Last Therapy Session (1), Review of Medication Regimen & Side Effects (2) and Review of New Medication or Change in Dosage  (2)  Jazia Faraci MD 10/24/2014, 12:59 PM

## 2014-10-24 NOTE — Progress Notes (Signed)
Change is baseline. Pt observed wondering the unit, actively hallucinating. Talking to self, laughing and smiling, disorganized. Difficult to redirect.

## 2014-10-24 NOTE — [facility] Group Notes (Signed)
[facility]   10/24/2014 10:30 AM  Participation Quality:  Engaged  Mood/Affect:  Flat  Depression Rating:    Anxiety Rating:    Thoughts of Suicide:  No Will you contract for safety?   NA  Current AVH:  Denies  Plan for Discharge/Comments:  Mother visited over the weekend, and it went OK.  States she had him cleaning, and when pressed about this, didn't really make sense.  States he's OK with current meds and is not experiencing side effects.  Wonders when he will be d/ced.  Instructed him to speak with doctor.  Transportation Means:   Supports:  [name], [name]

## 2014-10-24 NOTE — Progress Notes (Signed)
The focus of this group is to help patients review their daily goal of treatment and discuss progress on daily workbooks. Pt attended the evening group session but responded to discussion prompts from the Writer with minimal, mumbled answers. Pt shared that he bought a new car today, sold his old house and broke up with his girlfriend. "That's how I wound up here." Pt shared that his only concern tonight was to get a good night's sleep, but that there was nothing additional Nursing Staff could help him with. Pt appeared uncomfortable in group, leaving and returning several times. Pt also had difficult making eye contact when speaking or being spoken to.

## 2014-10-24 NOTE — [facility] Group Notes (Signed)
[facility] LCSW Group Therapy  10/24/2014 1:15 pm  Type of Therapy: Process Group Therapy  Participation Level:  Active  Participation Quality:  Appropriate  Affect:  Flat  Cognitive:  Oriented  Insight:  Improving  Engagement in Group:  Limited  Engagement in Therapy:  Limited  Modes of Intervention:  Activity, Clarification, Education, Problem-solving and Support  Summary of Progress/Problems: Today's group addressed the issue of overcoming obstacles.  Patients were asked to identify their biggest obstacle post d/c that stands in the way of their on-going success, and then problem solve as to how to manage this.  "No one asks me how I am doing.  I'm tired of being disrespected."  Attempted to get clarification.  He then got upset because he said my questions caused him to lose his train of thought.  He left, but returned.  Talked about trying to find the balance between being busy and occupied, and not having much to do.  Could not verbalize the benefits of either.  Vague, some flight of ideas, difficult to follow.  Ida Rogueorth, Dane Bloch B 10/24/2014   2:49 PM

## 2014-10-25 LAB — VALPROIC ACID LEVEL: VALPROIC ACID LVL: 37 ug/mL — AB (ref 50.0–100.0)

## 2014-10-25 MED ORDER — ARIPIPRAZOLE 10 MG PO TABS
10.0000 mg | ORAL_TABLET | Freq: Every day | ORAL | Status: DC
  Administered 2014-10-25: 10 mg via ORAL
  Filled 2014-10-25 (×3): qty 1

## 2014-10-25 MED ORDER — DIVALPROEX SODIUM ER 500 MG PO TB24
1000.0000 mg | ORAL_TABLET | Freq: Every day | ORAL | Status: DC
  Administered 2014-10-25: 1000 mg via ORAL
  Filled 2014-10-25 (×2): qty 2
  Filled 2014-10-25: qty 28

## 2014-10-25 MED ORDER — ARIPIPRAZOLE 5 MG PO TABS
5.0000 mg | ORAL_TABLET | Freq: Every day | ORAL | Status: DC
  Administered 2014-10-26: 5 mg via ORAL
  Filled 2014-10-25: qty 1
  Filled 2014-10-25: qty 14
  Filled 2014-10-25: qty 1

## 2014-10-25 NOTE — Progress Notes (Signed)
Patient ID: [name], male   DOB: 06-Sep-1985, 29 y.o.   MRN: [id number] [facility] MD Progress Note  10/25/2014 2:36 PM [name]  MRN:  [id number]   Subjective:  Patient states "I am ok , I want to go home."   Objective:  Patient is seen and chart is reviewed. [name] is a 29 year old male who was placed under IVC by his mother for unpredictable behaviors such as becoming aggressive towards his mother and has been observed talking to someone was not there. He has a history of admissions to [facility], [facility], and [facility] in the past. Has a history of past cocaine and cannabis abuse but his urine drug screen is currently negative.  Patient seen and chart reviewed.Discussed patient with treatment team.   Patient is a limited historian.Today seems to disheveled , irritable , has poor eye contact. Pt states "no' to all questions asked , stating he has no complaints. Pt appears to have a foul odor , has stains on his Tshirt. Pt states he has no other clothes to wear.  Pt focussed on discharge - states he wants to go home and stay with mother. Pt halfway through the evaluation became irritable , walked out of the room and refused to cooperate any more.  Per staff patient appears to disorganized, has been compliant on medications.       Principal Problem: Schizoaffective disorder, bipolar type, Multiple episodes, currently in acute episode Diagnosis:   Patient Active Problem List   Diagnosis Date Noted  . Schizoaffective disorder, bipolar type [F25.0] 10/21/2014  . Tobacco use disorder [Z72.0] 10/21/2014  . Cocaine use disorder, moderate, in sustained remission [F14.90] 10/21/2014  . Cannabis use disorder, severe, in sustained remission [F12.90] 10/21/2014   Total Time spent with patient: 30 minutes  Past Medical History:  Past Medical History  Diagnosis Date  . Hypertension   . Depression   . Schizophrenia     Past Surgical History  Procedure Laterality Date  . Back surgery      Family History:  Family History  Problem Relation Age of Onset  . Allergies Mother   . Asthma Mother    Social History:  History  Alcohol Use No     History  Drug Use Not on file    History   Social History  . Marital Status: Single    Spouse Name: N/A  . Number of Children: 0  . Years of Education: N/A   Occupational History  . on disability    Social History Main Topics  . Smoking status: Current Every Day Smoker -- 1.00 packs/day for 8 years    Types: Cigarettes  . Smokeless tobacco: Not on file  . Alcohol Use: No  . Drug Use: Not on file  . Sexual Activity: Not Currently   Other Topics Concern  . None   Social History Narrative   Single. No children. Lives with mother.    Additional History:    Sleep: Fair  Appetite:  Good  Musculoskeletal: Strength & Muscle Tone: within normal limits Gait & Station: normal Patient leans: N/A  Psychiatric Specialty Exam: Physical Exam  Review of Systems  Unable to perform ROS: mental acuity    Blood pressure 115/75, pulse 89, temperature 98.2 F (36.8 C), temperature source Oral, resp. rate 18, height  (1.778 m), weight 112.946 kg (249 lb).Body mass index is 35.73 kg/(m^2).  General Appearance: Disheveled has stains all over his clothes , has foul odor  Eye Contact::  Fair  Speech:  Slow  Volume:  Normal  Mood:  Irritable  Affect:  Anxious   Thought Process:  Disorganized  Orientation:  Full (Time, Place, and Person)  Thought Content:  Paranoid Ideation  Suicidal Thoughts:  No  Homicidal Thoughts:  No  Memory:  Immediate;   Fair Recent;   Fair Remote;   Fair  Judgement:  Poor  Insight:  Lacking  Psychomotor Activity:  Restlessness  Concentration:  Poor  Recall:  Poor  Fund of Knowledge:Poor  Language: Fair  Akathisia:  No  Handed:  Right  AIMS (if indicated):     Assets:  Housing Intimacy Physical Health Social Support  ADL's:  Impaired body odor present   Cognition: WNL  Sleep:   Number of Hours: 6.5   Current Medications: Current Facility-Administered Medications  Medication Dose Route Frequency Provider Last Rate Last Dose  . acetaminophen (TYLENOL) tablet 650 mg  650 mg Oral Q6H PRN Earney Navy, NP      . alum & mag hydroxide-simeth (MAALOX/MYLANTA) 200-200-20 MG/5ML suspension 30 mL  30 mL Oral Q4H PRN Earney Navy, NP   30 mL at 10/25/14 0748  . ARIPiprazole (ABILIFY) tablet 10 mg  10 mg Oral QHS Jomarie Longs, MD      . Melene Muller ON 10/26/2014] ARIPiprazole (ABILIFY) tablet 5 mg  5 mg Oral Q breakfast Ahnesti Townsend, MD      . ARIPiprazole SUSR 400 mg  400 mg Intramuscular Q28 days Jomarie Longs, MD   400 mg at 10/25/14 1206  . benztropine (COGENTIN) tablet 0.5 mg  0.5 mg Oral BH-qamhs Amel Gianino, MD   0.5 mg at 10/25/14 0747  . divalproex (DEPAKOTE ER) 24 hr tablet 1,000 mg  1,000 mg Oral QHS Nox Talent, MD      . hydrOXYzine (ATARAX/VISTARIL) tablet 25 mg  25 mg Oral Q6H PRN Jomarie Longs, MD   25 mg at 10/24/14 1452  . magnesium hydroxide (MILK OF MAGNESIA) suspension 30 mL  30 mL Oral Daily PRN Earney Navy, NP   30 mL at 10/24/14 1703  . OLANZapine zydis (ZYPREXA) disintegrating tablet 10 mg  10 mg Oral Q8H PRN Jomarie Longs, MD   10 mg at 10/24/14 1452   And  . ziprasidone (GEODON) injection 20 mg  20 mg Intramuscular PRN Jomarie Longs, MD      . traZODone (DESYREL) tablet 50 mg  50 mg Oral QHS PRN Jomarie Longs, MD        Lab Results:  Results for orders placed or performed during the hospital encounter of 10/20/14 (from the past 48 hour(s))  Valproic acid level     Status: Abnormal   Collection Time: 10/25/14  6:30 AM  Result Value Ref Range   Valproic Acid Lvl 37 (L) 50.0 - 100.0 ug/mL    Comment: Performed at [facility]    Physical Findings: AIMS: Facial and Oral Movements Muscles of Facial Expression: None, normal Lips and Perioral Area: None, normal Jaw: None, normal Tongue: None, normal,Extremity  Movements Upper (arms, wrists, hands, fingers): None, normal Lower (legs, knees, ankles, toes): None, normal, Trunk Movements Neck, shoulders, hips: None, normal, Overall Severity Severity of abnormal movements (highest score from questions above): None, normal Incapacitation due to abnormal movements: None, normal Patient's awareness of abnormal movements (rate only patient's report): No Awareness, Dental Status Current problems with teeth and/or dentures?: No Does patient usually wear dentures?: No  CIWA:    COWS:     Assessment: Coury  Dyanne IhaBernardino is a 29 year old  hispanic male with a history of Schizoaffective Disorder. Patient presented after he decompensated due to his medication -Invega sustenna being switched to [place]ve[place] recently. Pt also with hx of polysubstance abuse- his UDS negative this time. Pt however has stressors of pending court hearing - June 9 th , as well as was not comfortable at his transition home and hence was sleeping at his mother's place. Pt tolerating abilify and depakote well. Pt will continue to need medication management.    Treatment Plan Summary: Daily contact with patient to assess and evaluate symptoms and progress in treatment and Medication management  Continue crisis management and stabilization.  Medication management:  Will increase Abilify to 15 mg po daily. Pt denies any ADRs. Abilify maintena 400 mg IM x 1 dose given today (10/25/14) Continue Cogentin 0.5 mg every am and hs for EPS prevention. Continue Trazodone 50 mg po qhs prn for sleep. Increase Depakote ER to 1000 mg at bedtime for improved mood stability. Depakote level reviewed today - subtherapeutic- 37 ug/ml. Encouraged patient to attend groups and participate in group counseling sessions and activities.  Discharge plan in progress.  Continue current treatment plan.  Reviewed EKG- 10/21/14-NSR-QTC-WNL. Address health issues: Vitals reviewed and stable.  CSW will work on  disposition.  Medical Decision Making:  Review of Psycho-Social Stressors (1), Review or order clinical lab tests (1), Review of Last Therapy Session (1), Review of Medication Regimen & Side Effects (2) and Review of New Medication or Change in Dosage (2)  Arris Meyn MD 10/25/2014, 2:36 PM

## 2014-10-25 NOTE — Progress Notes (Signed)
Patient ID: Riley BonitoJoss [name], male   DOB: 09/27/1985, 29 y.o.   MRN: [id number] D: Patient continues to exhibit disorganized thoughts; tangental speech.  He is restless and fidgety.  Patient has been observed talking to himself.  He denies SI/HI/AVH.  He has not filled out a personal inventory form.  He is compliant with his medications.  He has bright affect and is pleasant with staff.  He denies SI/HI.  He denies AVH, even though he has been observed responding to internal stimuli.  He did attend morning group and participated. A: Continue to monitor medication management and MD orders.  Safety checks completed every 15 minutes per protocol. R: Patient is in room writing in his journal.  Stated he needed "a large pencil because he had a lot to write."

## 2014-10-25 NOTE — [facility] Group Notes (Signed)
[facility] Group Notes:  (Counselor/Nursing/MHT/Case Management/Adjunct)  10/25/2014 1:15PM  Type of Therapy:  Group Therapy  Participation Level:  Active  Participation Quality:  Appropriate  Affect:  Flat  Cognitive:  Oriented  Insight:  Improving  Engagement in Group:  Limited  Engagement in Therapy:  Limited  Modes of Intervention:  Discussion, Exploration and Socialization  Summary of Progress/Problems: The topic for group was balance in life.  Pt participated in the discussion about when their life was in balance and out of balance and how this feels.  Pt discussed ways to get back in balance and short term goals they can work on to get where they want to be. Pulled me aside right before group.  Told me the Dr would not tell him when he is scheduled to leave.  Told him she would see him again in AM and make a decision then about d/c that day.  "That doesn't make any sense."   Frustrated, but came to group anyway.  Talked about frustration that can lead to agression. Came in and out of group several times-said he likes to walk when he is feeling anxious.  When asked about this, stated "I haven't been anxious for a long time."  Gave advice to others; very little was related directly to their situation.    Daryel Geraldorth, Darleen Moffitt B 10/25/2014 11:12 AM

## 2014-10-25 NOTE — Progress Notes (Signed)
Pt's mother Kathryne GinRosemary Ortiz- [phone] is requesting that staff reconsider pts discharge date.  She states she" is concerned that pt is having too many mood swings."

## 2014-10-25 NOTE — [facility] Group Notes (Signed)
[facility] Group Notes:  (Nursing/MHT/Case Management/Adjunct)  Date:  10/25/2014  Time: 0900 am  Type of Therapy:  Psychoeducational Skills  Participation Level:  Minimal  Participation Quality:  Attentive  Affect:  Blunted  Cognitive:  Disorganized  Insight:  Lacking  Engagement in Group:  Lacking  Modes of Intervention:  Support  Summary of Progress/Problems:  [name], [name] 10/25/2014, 2:45 PM

## 2014-10-26 ENCOUNTER — Encounter (HOSPITAL_COMMUNITY): Payer: Self-pay | Admitting: Registered Nurse

## 2014-10-26 MED ORDER — HYDROXYZINE HCL 25 MG PO TABS: 25.0000 mg | ORAL_TABLET | Freq: Four times a day (QID) | ORAL | Status: DC | PRN

## 2014-10-26 MED ORDER — TRAZODONE HCL 50 MG PO TABS: 50.0000 mg | ORAL_TABLET | Freq: Every evening | ORAL | Status: DC | PRN

## 2014-10-26 MED ORDER — NICOTINE 14 MG/24HR TD PT24
14.0000 mg | MEDICATED_PATCH | Freq: Every day | TRANSDERMAL | Status: DC
  Filled 2014-10-26 (×3): qty 1

## 2014-10-26 MED ORDER — ARIPIPRAZOLE ER 400 MG IM SUSR: 400.0000 mg | INTRAMUSCULAR | Status: DC

## 2014-10-26 MED ORDER — ARIPIPRAZOLE 5 MG PO TABS: ORAL_TABLET | ORAL | Status: DC

## 2014-10-26 MED ORDER — BENZTROPINE MESYLATE 0.5 MG PO TABS: 0.5000 mg | ORAL_TABLET | ORAL | Status: DC

## 2014-10-26 MED ORDER — DIVALPROEX SODIUM ER 500 MG PO TB24: 1000.0000 mg | ORAL_TABLET | Freq: Every day | ORAL | Status: DC

## 2014-10-26 NOTE — Progress Notes (Signed)
  D: At the start of shift Pt was a little irritated about not going home today. He even believed the night nurse was involved in that decision making. Pt felt better after knowing that nurse only came in at 1900. Pt continue to be disorganized in his thoughts, was seen laughing inappropriately, talking to self, and pacing the hallway. Pt also seems confused about his immediate environment. Pt denies SI/HI, denies any form of AVH. Pt was however, calm, co-operative, and easily re-directable.    A: Pt was re-oriented as needed. Medications were offered as prescribed. Support and encouragement also offered.   R: Pt accepted medications willingly without complaint. Meds were well tolerated. 15 mins safety checks continue.
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
## 2014-10-26 NOTE — Progress Notes (Signed)
  [facility] :  Will you be returning to the same living situation after discharge:  Yes,  home with mom At discharge, do you have transportation home?: Yes,  mother Do you have the ability to pay for your medications: Yes,  MCD  Release of information consent forms completed and in the chart;  Patient's signature needed at discharge.  Patient to Follow up at: Follow-up Information    Follow up with [facility] ACT team.   Why:  Someone from the ACT team will be by to see you tomorrow.  Call them to let them know where they should meet up with you   Contact information:   [address]  [place]  [phone]      Patient denies SI/HI: Yes,  yes    Safety Planning and Suicide Prevention discussed: Yes,  yes  Have you used any form of tobacco in the last 30 days? (Cigarettes, Smokeless Tobacco, Cigars, and/or Pipes): Yes  Has patient been referred to the Quitline?: Patient refused referral  Ida Rogueorth, Jamyla Ard B 10/26/2014, 10:18 AM

## 2014-10-26 NOTE — Plan of Care (Signed)
Problem: Ineffective individual coping Goal: STG: Patient will remain free from self harm Outcome: Progressing Pt has not had any self harm behaviors   Problem: Alteration in thought process Goal: LTG-Patient behavior demonstrates decreased signs psychosis Presents with disorganization, limited insight, was not sleeping previous to admission. Goal not met. R North LCSW 10/21/2014 1:47 PM  Outcome: Progressing Pt continues to be observed responding to internal stimuli Goal: LTG-Patient verbalizes understanding importance med regimen (Patient verbalizes understanding of importance of medication regimen and need to continue outpatient care.)  Outcome: Progressing Pt is compliant with medications Goal: STG-Patient is able to sleep at least 6 hours per night Outcome: Progressing Pt has slept 6 hours for several nights

## 2014-10-26 NOTE — Tx Team (Signed)
Interdisciplinary Treatment Plan Update (Adult)  Date:  10/26/2014   Time Reviewed:  8:28 AM   Progress in Treatment: Attending groups: Yes. Participating in groups:  Yes. Taking medication as prescribed:  Yes. Tolerating medication:  Yes. Family/Significant othe contact made:  Yes Patient understands diagnosis:  Yes   Discussing patient identified problems/goals with staff:  Yes, see initial care plan. Medical problems stabilized or resolved:  Yes. Denies suicidal/homicidal ideation: Yes. Issues/concerns per patient self-inventory:  No. Other:  New problem(s) identified:  Discharge Plan or Barriers: return home, follow up with ACT team  Reason for Continuation of Hospitalization:   Comments:  Estimated length of stay:  D/C today  New goal(s):  Review of initial/current patient goals per problem list:     Attendees: Patient:  10/26/2014 8:28 AM   Family:   10/26/2014 8:28 AM   Physician:  Jomarie LongsSaramma Eappen, MD 10/26/2014 8:28 AM   Nursing:   Omelia BlackwaterNoelle Ardley, RN 10/26/2014 8:28 AM   CSW:    Daryel Geraldodney Shakyia Bosso, LCSW   10/26/2014 8:28 AM   Other:  10/26/2014 8:28 AM   Other:   10/26/2014 8:28 AM   Other:  Onnie BoerJennifer Clark, Nurse CM 10/26/2014 8:28 AM   Other:  Leisa LenzValerie Enoch, [facility] 10/26/2014 8:28 AM   Other:  Tomasita Morrowelora Sutton, P4CC  10/26/2014 8:28 AM   Other:  10/26/2014 8:28 AM   Other:  10/26/2014 8:28 AM   Other:  10/26/2014 8:28 AM   Other:  10/26/2014 8:28 AM   Other:  10/26/2014 8:28 AM   Other:   10/26/2014 8:28 AM    Scribe for Treatment Team:   Ida RogueNorth, Desaray Marschner B, 10/26/2014 8:28 AM

## 2014-10-26 NOTE — Progress Notes (Signed)
Pt is D/C home. Pt denies SI/HI/AV. Pt follow-up and medications were reviewed and pt verbalized understanding. Pt pleasant and cooperative. Pt belongings were returned.   

## 2014-10-26 NOTE — [facility] Suicide Risk Assessment (Signed)
[facility]   Demographic Factors:  Male  Total Time spent with patient: 30 minutes  Musculoskeletal: Strength & Muscle Tone: within normal limits Gait & Station: normal Patient leans: N/A  Psychiatric Specialty Exam: Physical Exam  Review of Systems  Psychiatric/Behavioral: Positive for substance abuse (stable).  All other systems reviewed and are negative.   Blood pressure 137/67, pulse 90, temperature 97.9 F (36.6 C), temperature source Oral, resp. rate 17, height 5\' 10"  (1.778 m), weight 112.946 kg (249 lb), SpO2 100 %.Body mass index is 35.73 kg/(m^2).  General Appearance: Casual  Eye Contact::  Fair  Speech:  Clear and Coherent409  Volume:  Normal  Mood:  Anxious about being locked up here  Affect:  Congruent  Thought Process:  Coherent  Orientation:  Full (Time, Place, and Person)  Thought Content:  WDL  Suicidal Thoughts:  No  Homicidal Thoughts:  No  Memory:  Immediate;   Fair Recent;   Fair Remote;   Fair  Judgement:  Fair  Insight:  Shallow  Psychomotor Activity:  Normal  Concentration:  Fair  Recall:  Fis[employer]Fair  Fund of Knowledge:Fair  Language: Fair  Akathisia:  No  Handed:  Right  AIMS (if indicated):     Assets:  Desire for Improvement Physical Health Social Support  Sleep:  Number of Hours: 5.75  Cognition: WNL  ADL's:  Intact   Have you used any form of tobacco in the last 30 days? (Cigarettes, Smokeless Tobacco, Cigars, and/or Pipes): Yes  Has this patient used any form of tobacco in the last 30 days? (Cigarettes, Smokeless Tobacco, Cigars, and/or Pipes) Yes, Prescription provided with nicotine patches per request.  Mental Status Per Nursing Assessment::   On Admission:  NA  Current Mental Status by Physician: patient denies SI/HI/AH/VH  Loss Factors: Legal issues  Historical Factors: Impulsivity  Risk Reduction Factors:   Living with another person, especially a relative and Positive social  support  Continued Clinical Symptoms:  Alcohol/Substance Abuse/Dependencies Previous Psychiatric Diagnoses and Treatments  Cognitive Features That Contribute To Risk:  Thought constriction (tunnel vision)    Suicide Risk:  Minimal: No identifiable suicidal ideation.  Patients presenting with no risk factors but with morbid ruminations; may be classified as minimal risk based on the severity of the depressive symptoms  Principal Problem: Schizoaffective disorder, bipolar type, MULTIPLE EPISODES, CURRENTLY IN ACUTE EPISODE ( RESOLVED ACUTE PHASE) Discharge Diagnoses:  Patient Active Problem List   Diagnosis Date Noted  . Schizoaffective disorder, bipolar type [F25.0] 10/21/2014  . Tobacco use disorder [Z72.0] 10/21/2014  . Cocaine use disorder, moderate, in sustained remission [F14.90] 10/21/2014  . Cannabis use disorder, severe, in sustained remission [F12.90] 10/21/2014      Plan Of Care/Follow-up recommendations:  Activity:  NO RESTRICTIONS Diet:  REGULAR Tests:  DEPAKOTE LEVEL ON 10/31/14, Follow LFTs,platelet count as recommended by out patient provider. Other:  Patient received his Abilify Maintena IM 400 mg last dose on 10/25/14.Repeat q 28 days.  Is patient on multiple antipsychotic therapies at discharge:  No   Has Patient had three or more failed trials of antipsychotic monotherapy by history:  No  Recommended Plan for Multiple Antipsychotic Therapies: NA    Emmogene Simson MD 10/26/2014, 9:28 AM

## 2014-11-25 ENCOUNTER — Other Ambulatory Visit (HOSPITAL_COMMUNITY): Payer: Self-pay | Admitting: Psychiatry

## 2014-12-05 ENCOUNTER — Encounter (HOSPITAL_COMMUNITY): Payer: Self-pay

## 2014-12-05 ENCOUNTER — Emergency Department (HOSPITAL_COMMUNITY)
Admission: EM | Admit: 2014-12-05 | Discharge: 2014-12-05 | Disposition: A | Discharge status: Home / Self Care | Payer: Medicare Other | Attending: Emergency Medicine | Admitting: Emergency Medicine

## 2014-12-05 DIAGNOSIS — F329 Major depressive disorder, single episode, unspecified: Secondary | ICD-10-CM | POA: Diagnosis not present

## 2014-12-05 DIAGNOSIS — Z046 Encounter for general psychiatric examination, requested by authority: Secondary | ICD-10-CM | POA: Diagnosis present

## 2014-12-05 DIAGNOSIS — I10 Essential (primary) hypertension: Secondary | ICD-10-CM | POA: Insufficient documentation

## 2014-12-05 DIAGNOSIS — F209 Schizophrenia, unspecified: Secondary | ICD-10-CM | POA: Diagnosis not present

## 2014-12-05 DIAGNOSIS — Z720 Tobacco use: Secondary | ICD-10-CM | POA: Insufficient documentation

## 2014-12-05 DIAGNOSIS — Z79899 Other long term (current) drug therapy: Secondary | ICD-10-CM | POA: Diagnosis not present

## 2014-12-05 DIAGNOSIS — F250 Schizoaffective disorder, bipolar type: Secondary | ICD-10-CM | POA: Diagnosis present

## 2014-12-05 DIAGNOSIS — F29 Unspecified psychosis not due to a substance or known physiological condition: Secondary | ICD-10-CM | POA: Diagnosis not present

## 2014-12-05 DIAGNOSIS — R45850 Homicidal ideations: Secondary | ICD-10-CM | POA: Diagnosis present

## 2014-12-05 DIAGNOSIS — F251 Schizoaffective disorder, depressive type: Secondary | ICD-10-CM | POA: Diagnosis not present

## 2014-12-05 DIAGNOSIS — Z9114 Patient's other noncompliance with medication regimen: Secondary | ICD-10-CM | POA: Diagnosis present

## 2014-12-05 LAB — CBC WITH DIFFERENTIAL/PLATELET
Basophils Absolute: 0 10*3/uL (ref 0.0–0.1)
Basophils Relative: 0 % (ref 0–1)
EOS ABS: 0.2 10*3/uL (ref 0.0–0.7)
Eosinophils Relative: 1 % (ref 0–5)
HCT: 46 % (ref 39.0–52.0)
Hemoglobin: 16.3 g/dL (ref 13.0–17.0)
Lymphocytes Relative: 27 % (ref 12–46)
Lymphs Abs: 3.3 10*3/uL (ref 0.7–4.0)
MCH: 30.1 pg (ref 26.0–34.0)
MCHC: 35.4 g/dL (ref 30.0–36.0)
MCV: 85 fL (ref 78.0–100.0)
MONOS PCT: 6 % (ref 3–12)
Monocytes Absolute: 0.7 10*3/uL (ref 0.1–1.0)
Neutro Abs: 8.3 10*3/uL — ABNORMAL HIGH (ref 1.7–7.7)
Neutrophils Relative %: 66 % (ref 43–77)
PLATELETS: 301 10*3/uL (ref 150–400)
RBC: 5.41 MIL/uL (ref 4.22–5.81)
RDW: 12.2 % (ref 11.5–15.5)
WBC: 12.6 10*3/uL — AB (ref 4.0–10.5)

## 2014-12-05 LAB — COMPREHENSIVE METABOLIC PANEL
ALT: 33 U/L (ref 17–63)
AST: 21 U/L (ref 15–41)
Albumin: 4.6 g/dL (ref 3.5–5.0)
Alkaline Phosphatase: 85 U/L (ref 38–126)
Anion gap: 9 (ref 5–15)
BILIRUBIN TOTAL: 0.6 mg/dL (ref 0.3–1.2)
BUN: 12 mg/dL (ref 6–20)
CO2: 24 mmol/L (ref 22–32)
Calcium: 9.2 mg/dL (ref 8.9–10.3)
Chloride: 102 mmol/L (ref 101–111)
Creatinine, Ser: 0.97 mg/dL (ref 0.61–1.24)
GFR calc non Af Amer: >60 mL/min (ref >60)
Glucose, Bld: 198 mg/dL — ABNORMAL HIGH (ref 65–99)
Potassium: 3.5 mmol/L (ref 3.5–5.1)
Sodium: 135 mmol/L (ref 135–145)
Total Protein: 8.3 g/dL — ABNORMAL HIGH (ref 6.5–8.1)

## 2014-12-05 LAB — RAPID URINE DRUG SCREEN, HOSP PERFORMED
Amphetamines: NOT DETECTED
BARBITURATES: NOT DETECTED
BENZODIAZEPINES: NOT DETECTED
COCAINE: NOT DETECTED
OPIATES: NOT DETECTED
Tetrahydrocannabinol: NOT DETECTED

## 2014-12-05 LAB — VALPROIC ACID LEVEL: Valproic Acid Lvl: <10 ug/mL — ABNORMAL LOW (ref 50.0–100.0)

## 2014-12-05 LAB — ETHANOL

## 2014-12-05 MED ORDER — LORAZEPAM 0.5 MG PO TABS: 1.0000 mg | ORAL_TABLET | ORAL | Status: DC | PRN

## 2014-12-05 MED ORDER — BENZTROPINE MESYLATE 1 MG PO TABS: 0.5000 mg | ORAL_TABLET | ORAL | Status: DC

## 2014-12-05 MED ORDER — DIVALPROEX SODIUM ER 500 MG PO TB24
1000.0000 mg | ORAL_TABLET | Freq: Every day | ORAL | Status: DC
  Filled 2014-12-05: qty 2

## 2014-12-05 MED ORDER — IBUPROFEN 200 MG PO TABS: 600.0000 mg | ORAL_TABLET | Freq: Three times a day (TID) | ORAL | Status: DC | PRN

## 2014-12-05 MED ORDER — ONDANSETRON HCL 4 MG PO TABS: 4.0000 mg | ORAL_TABLET | Freq: Three times a day (TID) | ORAL | Status: DC | PRN

## 2014-12-05 MED ORDER — RISPERIDONE 1 MG PO TABS
1.0000 mg | ORAL_TABLET | Freq: Every day | ORAL | Status: DC
  Filled 2014-12-05: qty 1

## 2014-12-05 MED ORDER — ACETAMINOPHEN 325 MG PO TABS: 650.0000 mg | ORAL_TABLET | ORAL | Status: DC | PRN

## 2014-12-05 MED ORDER — TRAZODONE HCL 50 MG PO TABS: 50.0000 mg | ORAL_TABLET | Freq: Every evening | ORAL | Status: DC | PRN
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
## 2014-12-05 NOTE — ED Notes (Signed)
TSS is talking with patient unable to collect labs at this time
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
## 2014-12-05 NOTE — ED Notes (Signed)
Urinal has been given to patient. Asked to give us urine sample, patient is trying.

## 2014-12-05 NOTE — BH Assessment (Signed)
[facility] Assessment Progress Note  Per Dahlia ByesJosephine Onuoha, NP, this pt requires psychiatric hospitalization.  At 13:45 Morrie Sheldonshley calls [facility].  Pt has been accepted to their facility by Dr[facility] Wend[facility]  Please call report to [phone].  Julieanne CottonJosephine concurs with this decision.  Pt's nurse, Dawnaly, has been notified.  Pt is under IVC and [facility]  Doylene Canninghomas Ronnell Clinger, MA Triage Specialist [phone]

## 2014-12-05 NOTE — ED Notes (Signed)
Patient has 2 belonging bags. One bag has shoes, socks, ear phones, white shirt, black shorts, & loose change. Patient and belongings have been wanded.
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
## 2015-04-01 DIAGNOSIS — F250 Schizoaffective disorder, bipolar type: Secondary | ICD-10-CM | POA: Diagnosis present

## 2015-04-01 DIAGNOSIS — Z720 Tobacco use: Secondary | ICD-10-CM | POA: Diagnosis not present

## 2015-04-01 DIAGNOSIS — Z9114 Patient's other noncompliance with medication regimen: Secondary | ICD-10-CM | POA: Diagnosis not present

## 2015-04-23 ENCOUNTER — Emergency Department (HOSPITAL_COMMUNITY)
Admission: EM | Admit: 2015-04-23 | Discharge: 2015-04-25 | Payer: Medicare Other | Attending: Emergency Medicine | Admitting: Emergency Medicine

## 2015-04-23 ENCOUNTER — Encounter (HOSPITAL_COMMUNITY): Payer: Self-pay | Admitting: *Deleted

## 2015-04-23 DIAGNOSIS — F419 Anxiety disorder, unspecified: Secondary | ICD-10-CM | POA: Insufficient documentation

## 2015-04-23 DIAGNOSIS — Z79899 Other long term (current) drug therapy: Secondary | ICD-10-CM | POA: Diagnosis not present

## 2015-04-23 DIAGNOSIS — F329 Major depressive disorder, single episode, unspecified: Secondary | ICD-10-CM | POA: Insufficient documentation

## 2015-04-23 DIAGNOSIS — F911 Conduct disorder, childhood-onset type: Secondary | ICD-10-CM | POA: Diagnosis present

## 2015-04-23 DIAGNOSIS — F22 Delusional disorders: Secondary | ICD-10-CM | POA: Insufficient documentation

## 2015-04-23 DIAGNOSIS — I10 Essential (primary) hypertension: Secondary | ICD-10-CM | POA: Diagnosis not present

## 2015-04-23 DIAGNOSIS — R45850 Homicidal ideations: Secondary | ICD-10-CM | POA: Diagnosis not present

## 2015-04-23 DIAGNOSIS — F17210 Nicotine dependence, cigarettes, uncomplicated: Secondary | ICD-10-CM | POA: Insufficient documentation

## 2015-04-23 DIAGNOSIS — F250 Schizoaffective disorder, bipolar type: Secondary | ICD-10-CM | POA: Diagnosis present

## 2015-04-23 DIAGNOSIS — F29 Unspecified psychosis not due to a substance or known physiological condition: Secondary | ICD-10-CM | POA: Diagnosis not present

## 2015-04-23 LAB — COMPREHENSIVE METABOLIC PANEL
ALT: 37 U/L (ref 17–63)
ANION GAP: 11 (ref 5–15)
AST: 23 U/L (ref 15–41)
Albumin: 4.8 g/dL (ref 3.5–5.0)
Alkaline Phosphatase: 102 U/L (ref 38–126)
BUN: 14 mg/dL (ref 6–20)
CO2: 26 mmol/L (ref 22–32)
Calcium: 9.6 mg/dL (ref 8.9–10.3)
Chloride: 100 mmol/L — ABNORMAL LOW (ref 101–111)
Creatinine, Ser: 0.87 mg/dL (ref 0.61–1.24)
GFR calc Af Amer: >60 mL/min (ref >60)
GFR calc non Af Amer: >60 mL/min (ref >60)
Glucose, Bld: 134 mg/dL — ABNORMAL HIGH (ref 65–99)
POTASSIUM: 3.4 mmol/L — AB (ref 3.5–5.1)
SODIUM: 137 mmol/L (ref 135–145)
Total Bilirubin: 0.7 mg/dL (ref 0.3–1.2)
Total Protein: 8.7 g/dL — ABNORMAL HIGH (ref 6.5–8.1)

## 2015-04-23 LAB — ACETAMINOPHEN LEVEL: Acetaminophen (Tylenol), Serum: <10 ug/mL — ABNORMAL LOW (ref 10–30)

## 2015-04-23 LAB — ETHANOL: Alcohol, Ethyl (B): <5 mg/dL (ref <5)

## 2015-04-23 LAB — CBC
HEMATOCRIT: 47.2 % (ref 39.0–52.0)
HEMOGLOBIN: 16.6 g/dL (ref 13.0–17.0)
MCH: 30.2 pg (ref 26.0–34.0)
MCHC: 35.2 g/dL (ref 30.0–36.0)
MCV: 85.8 fL (ref 78.0–100.0)
Platelets: 300 10*3/uL (ref 150–400)
RBC: 5.5 MIL/uL (ref 4.22–5.81)
RDW: 12.5 % (ref 11.5–15.5)
WBC: 13.4 10*3/uL — AB (ref 4.0–10.5)

## 2015-04-23 LAB — RAPID URINE DRUG SCREEN, HOSP PERFORMED
Amphetamines: NOT DETECTED
BARBITURATES: NOT DETECTED
BENZODIAZEPINES: NOT DETECTED
COCAINE: NOT DETECTED
Opiates: NOT DETECTED
TETRAHYDROCANNABINOL: NOT DETECTED

## 2015-04-23 LAB — SALICYLATE LEVEL

## 2015-04-23 MED ORDER — TRAZODONE HCL 50 MG PO TABS: 50.0000 mg | ORAL_TABLET | Freq: Every evening | ORAL | Status: DC | PRN

## 2015-04-23 MED ORDER — BENZTROPINE MESYLATE 1 MG PO TABS
0.5000 mg | ORAL_TABLET | ORAL | Status: DC
  Filled 2015-04-23: qty 1

## 2015-04-23 MED ORDER — HYDROXYZINE HCL 25 MG PO TABS
25.0000 mg | ORAL_TABLET | Freq: Four times a day (QID) | ORAL | Status: DC | PRN
  Administered 2015-04-25: 25 mg via ORAL
  Filled 2015-04-23: qty 1

## 2015-04-23 MED ORDER — LORAZEPAM 1 MG PO TABS
2.0000 mg | ORAL_TABLET | Freq: Once | ORAL | Status: AC
  Administered 2015-04-23: 2 mg via ORAL
  Filled 2015-04-23: qty 2

## 2015-04-23 MED ORDER — DIVALPROEX SODIUM ER 500 MG PO TB24
1000.0000 mg | ORAL_TABLET | Freq: Every day | ORAL | Status: DC
  Filled 2015-04-23 (×2): qty 2

## 2015-04-23 MED ORDER — ARIPIPRAZOLE ER 400 MG IM SUSR
400.0000 mg | INTRAMUSCULAR | Status: DC
  Filled 2015-04-23: qty 400

## 2015-04-23 MED ORDER — ARIPIPRAZOLE 5 MG PO TABS
5.0000 mg | ORAL_TABLET | Freq: Every day | ORAL | Status: DC
  Administered 2015-04-23: 5 mg via ORAL
  Filled 2015-04-23 (×2): qty 1

## 2015-04-23 MED ORDER — ACETAMINOPHEN 325 MG PO TABS
650.0000 mg | ORAL_TABLET | Freq: Four times a day (QID) | ORAL | Status: DC | PRN
  Administered 2015-04-23: 650 mg via ORAL
  Filled 2015-04-23: qty 2

## 2015-04-23 NOTE — ED Notes (Signed)
Pt noted to be pacing in room and talking to himself.

## 2015-04-23 NOTE — ED Notes (Signed)
Patient standing in doorway talking to himself.  Went into room to take him a drink and he states, "I've just got to finish my story."  He is cooperative; standing at doorway waving to staff at nurses station.  Patient has abilify injection ordered; awaiting from pharmacy.

## 2015-04-23 NOTE — ED Notes (Signed)
Patient asleep, able to awaken with voice but unable to stay awake and would not talk with this RN. Pt's respirations regular and unlabored. No s/s of distress noted currently.

## 2015-04-23 NOTE — ED Notes (Signed)
Pt has been changed into scrubs and wanded by security.  

## 2015-04-23 NOTE — ED Notes (Signed)
Pharmacy called.  Unable to get abilify injection.  Possibly [facility] may have the injection in stock.  Will be unable to check until tomorrow.  Pharmacy to contact EDP re same.
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
## 2015-04-24 DIAGNOSIS — R45850 Homicidal ideations: Secondary | ICD-10-CM | POA: Diagnosis not present

## 2015-04-24 DIAGNOSIS — F250 Schizoaffective disorder, bipolar type: Secondary | ICD-10-CM | POA: Diagnosis not present

## 2015-04-24 MED ORDER — FLUPHENAZINE HCL 10 MG PO TABS
10.0000 mg | ORAL_TABLET | Freq: Two times a day (BID) | ORAL | Status: DC
  Administered 2015-04-25: 10 mg via ORAL
  Filled 2015-04-24 (×5): qty 1

## 2015-04-24 MED ORDER — OXCARBAZEPINE 300 MG PO TABS
300.0000 mg | ORAL_TABLET | Freq: Two times a day (BID) | ORAL | Status: DC
  Administered 2015-04-24 – 2015-04-25 (×3): 300 mg via ORAL
  Filled 2015-04-24 (×4): qty 1

## 2015-04-24 MED ORDER — AMANTADINE HCL 100 MG PO CAPS
100.0000 mg | ORAL_CAPSULE | Freq: Two times a day (BID) | ORAL | Status: DC
  Administered 2015-04-24 – 2015-04-25 (×3): 100 mg via ORAL
  Filled 2015-04-24 (×5): qty 1

## 2015-04-24 NOTE — BHH Counselor (Signed)
[facility]   Can be transported after morning meds tomorrow  (04/25/15) Report # [id number]   Accepting- Dr. Rosana HoesJack Wang   Terrace Fontanilla, M.S., LPCA, [place]ley[place]ASA, [facility] Licensed Professional Counselor Associate  Triage Specialist  [facility]  Therapeutic Triage Services Phone: [phone] Fax: [phone]

## 2015-04-24 NOTE — BH Assessment (Addendum)
BHH Assessment Progress Note  The following facilities have been contacted to seek placement for this pt, with results as noted:  Beds available, information sent, decision pending:  [facility]   No beds available, but accepting referrals for future consideration; information sent:  [facility]   At capacity:  [facility]   Riley Vaca, MA Triage Specialist [phone]     

## 2015-04-24 NOTE — ED Notes (Signed)
Patient observed on stretcher awake and alert. Patient would not engage in conversation with this Clinical research associatewriter. When ask why he was here he stated " ask the other nurse. " Patient denies SI HI and AVH. Patient is resting on stretcher listening to television. Q 15 minute checks in progress and maintained for safety. Motoring continues.

## 2015-04-24 NOTE — ED Notes (Signed)
Patient paced all morning, but behavior has been appropriate.  Appetite is good.  Patient continues to respond to internal stimuli as evidenced by patient carrying on conversations with people that are not there.  He is taking his scheduled medications.  Mother called with list of medications changes he [facility].  Info passed on to providers.  Mother also states that he will take all scheduled medications while he is in the hospital, but as soon as he gets out he will not take anything.  He does have and ACTT team, but they are not able to convince patient to comply with physician recommendations.
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
## 2015-04-25 DIAGNOSIS — F17210 Nicotine dependence, cigarettes, uncomplicated: Secondary | ICD-10-CM | POA: Diagnosis present

## 2015-04-25 DIAGNOSIS — L709 Acne, unspecified: Secondary | ICD-10-CM | POA: Diagnosis not present

## 2015-04-25 DIAGNOSIS — F419 Anxiety disorder, unspecified: Secondary | ICD-10-CM | POA: Diagnosis not present

## 2015-04-25 DIAGNOSIS — K219 Gastro-esophageal reflux disease without esophagitis: Secondary | ICD-10-CM | POA: Diagnosis present

## 2015-04-25 DIAGNOSIS — Z9183 Wandering in diseases classified elsewhere: Secondary | ICD-10-CM | POA: Diagnosis not present

## 2015-04-25 DIAGNOSIS — F259 Schizoaffective disorder, unspecified: Secondary | ICD-10-CM | POA: Diagnosis not present

## 2015-04-25 DIAGNOSIS — I10 Essential (primary) hypertension: Secondary | ICD-10-CM | POA: Diagnosis not present

## 2015-04-25 DIAGNOSIS — Z9114 Patient's other noncompliance with medication regimen: Secondary | ICD-10-CM | POA: Diagnosis not present

## 2015-04-25 DIAGNOSIS — F22 Delusional disorders: Secondary | ICD-10-CM | POA: Diagnosis not present

## 2015-04-25 DIAGNOSIS — F250 Schizoaffective disorder, bipolar type: Secondary | ICD-10-CM | POA: Diagnosis present

## 2015-04-25 DIAGNOSIS — F329 Major depressive disorder, single episode, unspecified: Secondary | ICD-10-CM | POA: Diagnosis not present

## 2015-04-25 NOTE — ED Notes (Signed)
Pt is alert in his room and has been mildly restless this morning. Pt denies si and hi. Pt verbally denies hallucinations. Pt observed walking outside of his room moving his lips with no one near him. Safety maintained in the [facility].

## 2015-06-07 ENCOUNTER — Encounter (HOSPITAL_COMMUNITY): Payer: Self-pay | Admitting: Emergency Medicine

## 2015-06-07 ENCOUNTER — Emergency Department (HOSPITAL_COMMUNITY): Payer: Medicare Other

## 2015-06-07 ENCOUNTER — Emergency Department (HOSPITAL_COMMUNITY)
Admission: EM | Admit: 2015-06-07 | Discharge: 2015-06-07 | Disposition: A | Discharge status: Home / Self Care | Payer: Medicare Other | Attending: Emergency Medicine | Admitting: Emergency Medicine

## 2015-06-07 DIAGNOSIS — I10 Essential (primary) hypertension: Secondary | ICD-10-CM | POA: Diagnosis not present

## 2015-06-07 DIAGNOSIS — F209 Schizophrenia, unspecified: Secondary | ICD-10-CM | POA: Diagnosis not present

## 2015-06-07 DIAGNOSIS — F419 Anxiety disorder, unspecified: Secondary | ICD-10-CM | POA: Insufficient documentation

## 2015-06-07 DIAGNOSIS — Z79899 Other long term (current) drug therapy: Secondary | ICD-10-CM | POA: Diagnosis not present

## 2015-06-07 DIAGNOSIS — R079 Chest pain, unspecified: Secondary | ICD-10-CM | POA: Diagnosis not present

## 2015-06-07 DIAGNOSIS — F411 Generalized anxiety disorder: Secondary | ICD-10-CM | POA: Diagnosis not present

## 2015-06-07 DIAGNOSIS — Z7982 Long term (current) use of aspirin: Secondary | ICD-10-CM | POA: Diagnosis not present

## 2015-06-07 DIAGNOSIS — R0789 Other chest pain: Secondary | ICD-10-CM | POA: Diagnosis not present

## 2015-06-07 DIAGNOSIS — R4789 Other speech disturbances: Secondary | ICD-10-CM | POA: Diagnosis not present

## 2015-06-07 DIAGNOSIS — F17210 Nicotine dependence, cigarettes, uncomplicated: Secondary | ICD-10-CM | POA: Diagnosis not present

## 2015-06-07 DIAGNOSIS — F329 Major depressive disorder, single episode, unspecified: Secondary | ICD-10-CM | POA: Insufficient documentation

## 2015-06-07 LAB — CBC WITH DIFFERENTIAL/PLATELET
Basophils Absolute: 0 10*3/uL (ref 0.0–0.1)
Basophils Relative: 0 %
EOS ABS: 0.3 10*3/uL (ref 0.0–0.7)
Eosinophils Relative: 3 %
HCT: 42 % (ref 39.0–52.0)
Hemoglobin: 14.6 g/dL (ref 13.0–17.0)
LYMPHS ABS: 3.1 10*3/uL (ref 0.7–4.0)
LYMPHS PCT: 29 %
MCH: 29.8 pg (ref 26.0–34.0)
MCHC: 34.8 g/dL (ref 30.0–36.0)
MCV: 85.7 fL (ref 78.0–100.0)
Monocytes Absolute: 0.9 10*3/uL (ref 0.1–1.0)
Monocytes Relative: 8 %
NEUTROS PCT: 60 %
Neutro Abs: 6.5 10*3/uL (ref 1.7–7.7)
Platelets: 275 10*3/uL (ref 150–400)
RBC: 4.9 MIL/uL (ref 4.22–5.81)
RDW: 12.1 % (ref 11.5–15.5)
WBC: 10.9 10*3/uL — AB (ref 4.0–10.5)

## 2015-06-07 LAB — BASIC METABOLIC PANEL
Anion gap: 7 (ref 5–15)
BUN: 12 mg/dL (ref 6–20)
CALCIUM: 9.2 mg/dL (ref 8.9–10.3)
CO2: 26 mmol/L (ref 22–32)
CREATININE: 1.02 mg/dL (ref 0.61–1.24)
Chloride: 104 mmol/L (ref 101–111)
GFR calc Af Amer: >60 mL/min (ref >60)
GLUCOSE: 116 mg/dL — AB (ref 65–99)
POTASSIUM: 3.5 mmol/L (ref 3.5–5.1)
SODIUM: 137 mmol/L (ref 135–145)

## 2015-06-07 LAB — I-STAT TROPONIN, ED: TROPONIN I, POC: 0.01 ng/mL (ref 0.00–0.08)
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
## 2015-06-07 NOTE — ED Notes (Signed)
Per EMS, pt from home, c/o right and left sided chest pain on exertion. Pt began to feel pain about 2000 tonight while walking to the store. Upon EMS arrival, pt ambulatory, smoking a cigarette, and talking on the phone. Given 324 ASA. Denies n/v/d, shortness of breath or lightheadedness. BP-130/80, O2-99% RA.

## 2015-06-07 NOTE — Discharge Instructions (Signed)
PLEASE BE SURE TO TAKE YOUR MEDICATIONS AS THE DOCTOR TELLS YOU TO FOR YOUR SCHIZOPHRENIA. IT IS IMPORTANT THAT YOU TAKE THESE MEDICINES EVERYDAY.

## 2015-06-08 ENCOUNTER — Emergency Department (HOSPITAL_COMMUNITY)
Admission: EM | Admit: 2015-06-08 | Discharge: 2015-06-08 | Disposition: A | Discharge status: Other Acute Inpatient Hospital | Payer: Medicare Other | Attending: Emergency Medicine | Admitting: Emergency Medicine

## 2015-06-08 ENCOUNTER — Encounter (HOSPITAL_COMMUNITY): Payer: Self-pay | Admitting: Emergency Medicine

## 2015-06-08 ENCOUNTER — Inpatient Hospital Stay
Admission: EM | Admit: 2015-06-08 | Discharge: 2015-06-12 | DRG: 885 | Disposition: A | Discharge status: Home / Self Care | Payer: Medicare Other | Source: Intra-hospital | Attending: Psychiatry | Admitting: Psychiatry

## 2015-06-08 DIAGNOSIS — F1420 Cocaine dependence, uncomplicated: Secondary | ICD-10-CM | POA: Diagnosis present

## 2015-06-08 DIAGNOSIS — E8881 Metabolic syndrome: Secondary | ICD-10-CM | POA: Diagnosis present

## 2015-06-08 DIAGNOSIS — G4700 Insomnia, unspecified: Secondary | ICD-10-CM | POA: Diagnosis present

## 2015-06-08 DIAGNOSIS — Z79899 Other long term (current) drug therapy: Secondary | ICD-10-CM

## 2015-06-08 DIAGNOSIS — Z7982 Long term (current) use of aspirin: Secondary | ICD-10-CM

## 2015-06-08 DIAGNOSIS — Z9889 Other specified postprocedural states: Secondary | ICD-10-CM | POA: Diagnosis not present

## 2015-06-08 DIAGNOSIS — F1410 Cocaine abuse, uncomplicated: Secondary | ICD-10-CM | POA: Diagnosis not present

## 2015-06-08 DIAGNOSIS — F250 Schizoaffective disorder, bipolar type: Secondary | ICD-10-CM | POA: Diagnosis not present

## 2015-06-08 DIAGNOSIS — F1210 Cannabis abuse, uncomplicated: Secondary | ICD-10-CM | POA: Insufficient documentation

## 2015-06-08 DIAGNOSIS — Z9114 Patient's other noncompliance with medication regimen: Secondary | ICD-10-CM

## 2015-06-08 DIAGNOSIS — I10 Essential (primary) hypertension: Secondary | ICD-10-CM | POA: Diagnosis present

## 2015-06-08 DIAGNOSIS — F17200 Nicotine dependence, unspecified, uncomplicated: Secondary | ICD-10-CM | POA: Diagnosis present

## 2015-06-08 DIAGNOSIS — F17210 Nicotine dependence, cigarettes, uncomplicated: Secondary | ICD-10-CM | POA: Insufficient documentation

## 2015-06-08 DIAGNOSIS — Z825 Family history of asthma and other chronic lower respiratory diseases: Secondary | ICD-10-CM | POA: Diagnosis not present

## 2015-06-08 DIAGNOSIS — F329 Major depressive disorder, single episode, unspecified: Secondary | ICD-10-CM | POA: Diagnosis not present

## 2015-06-08 DIAGNOSIS — F1220 Cannabis dependence, uncomplicated: Secondary | ICD-10-CM | POA: Diagnosis present

## 2015-06-08 DIAGNOSIS — Z008 Encounter for other general examination: Secondary | ICD-10-CM | POA: Diagnosis present

## 2015-06-08 DIAGNOSIS — F200 Paranoid schizophrenia: Secondary | ICD-10-CM | POA: Insufficient documentation

## 2015-06-08 DIAGNOSIS — F209 Schizophrenia, unspecified: Secondary | ICD-10-CM | POA: Diagnosis not present

## 2015-06-08 LAB — RAPID URINE DRUG SCREEN, HOSP PERFORMED
Amphetamines: NOT DETECTED
BARBITURATES: NOT DETECTED
Benzodiazepines: NOT DETECTED
COCAINE: POSITIVE — AB
Opiates: NOT DETECTED
TETRAHYDROCANNABINOL: POSITIVE — AB

## 2015-06-08 LAB — ETHANOL: Alcohol, Ethyl (B): <5 mg/dL (ref <5)

## 2015-06-08 MED ORDER — ALUM & MAG HYDROXIDE-SIMETH 200-200-20 MG/5ML PO SUSP: 30.0000 mL | ORAL | Status: DC | PRN

## 2015-06-08 MED ORDER — FLUPHENAZINE HCL 5 MG PO TABS
10.0000 mg | ORAL_TABLET | Freq: Every day | ORAL | Status: DC
  Filled 2015-06-08: qty 2

## 2015-06-08 MED ORDER — ASPIRIN EC 81 MG PO TBEC
81.0000 mg | DELAYED_RELEASE_TABLET | Freq: Every day | ORAL | Status: DC
  Administered 2015-06-08: 81 mg via ORAL
  Filled 2015-06-08: qty 1

## 2015-06-08 MED ORDER — BENZTROPINE MESYLATE 1 MG PO TABS
0.5000 mg | ORAL_TABLET | Freq: Two times a day (BID) | ORAL | Status: DC
  Administered 2015-06-09: 0.5 mg via ORAL
  Filled 2015-06-08 (×2): qty 1

## 2015-06-08 MED ORDER — DIVALPROEX SODIUM ER 500 MG PO TB24
500.0000 mg | ORAL_TABLET | Freq: Every day | ORAL | Status: DC
  Administered 2015-06-08: 500 mg via ORAL
  Filled 2015-06-08: qty 1

## 2015-06-08 MED ORDER — OLANZAPINE 10 MG IM SOLR: 5.0000 mg | Freq: Three times a day (TID) | INTRAMUSCULAR | Status: DC | PRN

## 2015-06-08 MED ORDER — ONDANSETRON HCL 4 MG PO TABS: 4.0000 mg | ORAL_TABLET | Freq: Three times a day (TID) | ORAL | Status: DC | PRN

## 2015-06-08 MED ORDER — IBUPROFEN 400 MG PO TABS: 600.0000 mg | ORAL_TABLET | Freq: Three times a day (TID) | ORAL | Status: DC | PRN

## 2015-06-08 MED ORDER — PALIPERIDONE PALMITATE 234 MG/1.5ML IM SUSP: 234.0000 mg | Freq: Once | INTRAMUSCULAR | Status: DC

## 2015-06-08 MED ORDER — ESCITALOPRAM OXALATE 10 MG PO TABS
20.0000 mg | ORAL_TABLET | Freq: Every day | ORAL | Status: DC
  Administered 2015-06-08: 20 mg via ORAL
  Filled 2015-06-08: qty 2

## 2015-06-08 MED ORDER — BENZTROPINE MESYLATE 1 MG PO TABS
2.0000 mg | ORAL_TABLET | Freq: Every day | ORAL | Status: DC
  Administered 2015-06-08: 2 mg via ORAL
  Filled 2015-06-08: qty 2

## 2015-06-08 MED ORDER — FLUPHENAZINE HCL 10 MG PO TABS
10.0000 mg | ORAL_TABLET | Freq: Every day | ORAL | Status: DC
  Administered 2015-06-08: 10 mg via ORAL
  Filled 2015-06-08: qty 1

## 2015-06-08 MED ORDER — NICOTINE 21 MG/24HR TD PT24
21.0000 mg | MEDICATED_PATCH | Freq: Every day | TRANSDERMAL | Status: DC
  Filled 2015-06-08: qty 1

## 2015-06-08 MED ORDER — LORAZEPAM 1 MG PO TABS: 1.0000 mg | ORAL_TABLET | Freq: Three times a day (TID) | ORAL | Status: DC | PRN

## 2015-06-08 MED ORDER — ESCITALOPRAM OXALATE 10 MG PO TABS
10.0000 mg | ORAL_TABLET | Freq: Every day | ORAL | Status: DC
  Administered 2015-06-09: 10 mg via ORAL
  Filled 2015-06-08: qty 1

## 2015-06-08 MED ORDER — ACETAMINOPHEN 325 MG PO TABS: 650.0000 mg | ORAL_TABLET | ORAL | Status: DC | PRN

## 2015-06-08 NOTE — Progress Notes (Signed)
Patient with flat affect, sad, with quiet speech and brief eye contact. Fair adls, dresses in scrubs. Cooperative behavior with admission interview. Denies SI/HI/AVH at this time. Patient denies alcohol and drug use, writer sees alcohol and drug use in previous admit. Patient does smoke 1 1/2 packs cigarettes a day. Skin check performed with no wounds or bruises. Skin check performed with no contraband found. Ordered dinner tray for patient. Notified Dr. Garnetta Buddy of this direct admit for orders. Patient states he "has no medical issues and that he is here for medical things".

## 2015-06-08 NOTE — ED Notes (Signed)
Attempted report 

## 2015-06-08 NOTE — BH Assessment (Signed)
Pending review for possible placement with [facility].  

## 2015-06-08 NOTE — ED Provider Notes (Signed)
Pt is resting. Vital signs stable.  Medically cleared for psych evaluation.  Pt will be transferred to [facility]  Linwood Dibbles, MD 06/08/15 1550

## 2015-06-08 NOTE — Progress Notes (Signed)
Discussed pt's case with psych team. Dr. Lucianne Muss, after review of pt's chart and presenting symptoms, recommends pt meets criteria for inpatient admission.  Ilean Skill, MSW, LCSW Clinical Social Work, Disposition  06/08/2015 [phone]

## 2015-06-08 NOTE — ED Provider Notes (Signed)
Patient signed out to me at shift change by Marlon Pel, PA-C.  30 year old male with PMH of schizophrenia initially presented with chest pain and anxiety. Patient was discharged after unremarkable work-up. He returned to the ED for evaluation of schizophrenia.   Patient pending TTS evaluation in the AM. Plan to follow-up recommendations. Per [facility] assessment, patient being reviewed for possible placement. Per social work, patient meets inpatient criteria.  BP 110/65 mmHg  Pulse 77  Temp(Src) 98.7 F (37.1 C) (Oral)  Resp 20  Ht  (1.854 m)  Wt 111.131 kg  BMI 32.33 kg/m2  SpO2 99%   Mady Gemma, PA-C 06/08/15 1044  Kristen N Ward, DO 06/09/15 0300
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
## 2015-06-08 NOTE — ED Notes (Signed)
Called [facility] transport for transport after 3:00pm

## 2015-06-08 NOTE — BH Assessment (Signed)
Patient has been accepted to [facility].  Accepting physician is Dr. Lucianne Muss on the behalf Dr. Jennet Maduro.  Attending  Physician will be Dr. Jennet Maduro.  Patient has been assigned to room 324, by [facility] Charge Nurse [place].   Call report to [phone].  Representative/Transfer Coordinator is Maribell Demeo.  [place] Staff Argentina Donovan, RN) made aware of acceptance [facility] ([place]han, TTS/Social Worker) was made aware, as well  Patient can arrived after 3:00pm.  At this time, waiting for the patient, who is currently in the assigned room, to discharge.

## 2015-06-08 NOTE — ED Notes (Signed)
Pt states he need to be evaluated for his Schizophrenia, pt denies any pain or discomfort, denies any though of hurting him self or others, denies any VH or AH. Poor historian, refuses to have any labs drawn by phlebotomist, not cooperative at this time.

## 2015-06-09 ENCOUNTER — Encounter: Payer: Self-pay | Admitting: Psychiatry

## 2015-06-09 MED ORDER — TRAZODONE HCL 100 MG PO TABS: 100.0000 mg | ORAL_TABLET | Freq: Every evening | ORAL | Status: DC | PRN

## 2015-06-09 MED ORDER — ASPIRIN 81 MG PO CHEW
81.0000 mg | CHEWABLE_TABLET | Freq: Every day | ORAL | Status: DC
  Administered 2015-06-09 – 2015-06-12 (×4): 81 mg via ORAL
  Filled 2015-06-09 (×4): qty 1

## 2015-06-09 MED ORDER — ACETAMINOPHEN 325 MG PO TABS
650.0000 mg | ORAL_TABLET | Freq: Four times a day (QID) | ORAL | Status: DC | PRN
  Administered 2015-06-12: 650 mg via ORAL
  Filled 2015-06-09: qty 2

## 2015-06-09 MED ORDER — ESCITALOPRAM OXALATE 10 MG PO TABS
10.0000 mg | ORAL_TABLET | Freq: Every day | ORAL | Status: DC
  Administered 2015-06-10 – 2015-06-12 (×3): 10 mg via ORAL
  Filled 2015-06-09 (×3): qty 1

## 2015-06-09 MED ORDER — FLUPHENAZINE HCL 5 MG PO TABS
10.0000 mg | ORAL_TABLET | Freq: Every day | ORAL | Status: DC
  Filled 2015-06-09 (×2): qty 2

## 2015-06-09 MED ORDER — BENZTROPINE MESYLATE 1 MG PO TABS
0.5000 mg | ORAL_TABLET | Freq: Two times a day (BID) | ORAL | Status: DC
  Administered 2015-06-10 – 2015-06-12 (×3): 0.5 mg via ORAL
  Filled 2015-06-09 (×5): qty 1

## 2015-06-09 MED ORDER — OLANZAPINE 10 MG IM SOLR: 5.0000 mg | Freq: Three times a day (TID) | INTRAMUSCULAR | Status: DC | PRN

## 2015-06-09 MED ORDER — DIVALPROEX SODIUM 500 MG PO DR TAB
500.0000 mg | DELAYED_RELEASE_TABLET | Freq: Three times a day (TID) | ORAL | Status: DC
  Administered 2015-06-09 – 2015-06-12 (×6): 500 mg via ORAL
  Filled 2015-06-09 (×8): qty 1

## 2015-06-09 MED ORDER — MAGNESIUM HYDROXIDE 400 MG/5ML PO SUSP: 30.0000 mL | Freq: Every day | ORAL | Status: DC | PRN

## 2015-06-09 MED ORDER — FLUPHENAZINE DECANOATE 25 MG/ML IJ SOLN
50.0000 mg | INTRAMUSCULAR | Status: DC
  Filled 2015-06-09: qty 2

## 2015-06-09 MED ORDER — NICOTINE 21 MG/24HR TD PT24
21.0000 mg | MEDICATED_PATCH | Freq: Every day | TRANSDERMAL | Status: DC
  Filled 2015-06-09: qty 1

## 2015-06-09 MED ORDER — ALUM & MAG HYDROXIDE-SIMETH 200-200-20 MG/5ML PO SUSP
30.0000 mL | ORAL | Status: DC | PRN
  Administered 2015-06-11: 30 mL via ORAL
  Filled 2015-06-09: qty 30

## 2015-06-09 NOTE — Plan of Care (Signed)
Problem: Alteration in mood Goal: LTG-Patient reports reduction in suicidal thoughts (Patient reports reduction in suicidal thoughts and is able to verbalize a safety plan for whenever patient is feeling suicidal)  Patient denies SI/HI.

## 2015-06-09 NOTE — Progress Notes (Signed)
Recreation Therapy Notes  At approximately 1:25 pm, LRT attempted assessment. Patient refused.   Jacquelynn Cree, LRT/CTRS 06/09/2015 2:45 PM

## 2015-06-09 NOTE — Progress Notes (Signed)
D: Pt denies SI/HI/AVH, affect is flat, sad and irritable. Patient is not cooperative with care. Patient is not  interacting with peers and staff. A: Pt was offered support and encouragement. Pt was offered scheduled medications. Pt was encouraged to attend groups. Q 15 minute checks were done for safety.  R:Pt did not attend evening group.  Pt  Refused medication. Pt has no complaints.Pt is not receptive to treatment, safety maintained on unit.

## 2015-06-09 NOTE — Progress Notes (Signed)
Recreation Therapy Notes  Date: 01.20.17 Time: 3:00 pm Location: Craft Room  Group Topic: Coping Skills  Goal Area(s) Addresses:  Patient will participate in healthy coping skill.  Patient will verbalize one emotion experienced in group.  Behavioral Response: Did not attend  Intervention: Coloring  Activity: Patients were given coloring sheets and instructed to color.  Education: LRT educated patients to color.  Education Outcome: Patient did not attend group.  Clinical Observations/Feedback: Patient did not attend group.  Jacquelynn Cree, LRT/CTRS 06/09/2015 4:35 PM

## 2015-06-09 NOTE — Progress Notes (Signed)
Isolative to room.  Denies SI/HI/AVH.  Medication compliant.  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 Support and encouragement offered.  Safety maintained.

## 2015-06-09 NOTE — [facility] Group Notes (Signed)
[facility] Group Notes:  (Nursing/MHT/Case Management/Adjunct)  Date:  06/09/2015  Time:  12:35 PM  Type of Therapy:  Psychoeducational Skills  Participation Level:  Did Not Attend  Aliya Sol De'Chelle Mckell Riecke 06/09/2015, 12:35 PM

## 2015-06-09 NOTE — [facility] Group Notes (Signed)
[facility] LCSW Aftercare Discharge Planning Group Note   06/09/2015 3:35 PM  Participation Quality:  Did not attend.   Jaquanda Wickersham L [place] Leap MSW, LCSWA   

## 2015-06-09 NOTE — Plan of Care (Signed)
Problem: Alteration in mood Goal: LTG-Patient reports reduction in suicidal thoughts (Patient reports reduction in suicidal thoughts and is able to verbalize a safety plan for whenever patient is feeling suicidal)  Outcome: Progressing Denies SI     
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
## 2015-06-09 NOTE — BHH Suicide Risk Assessment (Addendum)
[facility] Admission Suicide Risk Assessment   Nursing information obtained from:    Demographic factors:    Current Mental Status:    Loss Factors:    Historical Factors:    Risk Reduction Factors:     Total Time spent with patient: 1 hour Principal Problem: Schizoaffective disorder, bipolar type (HCC) Diagnosis:   Patient Active Problem List   Diagnosis Date Noted  . 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 Schizoaffective disorder, bipolar type (HCC) [F25.0] 10/21/2014  . Tobacco use disorder [F17.200] 10/21/2014  . Cocaine use disorder, moderate, dependence (HCC) [F14.20] 10/21/2014  . Cannabis use disorder, moderate, dependence (HCC) [F12.20] 10/21/2014   Subjective Data:  [name]  Denies any symptoms of depression , anxiety, or psychosis  Instead he  complains  of shortness of breath,  Chest pain, an feeling tired.  Continued Clinical Symptoms:    The "Alcohol Use Disorders Identification Test", Guidelines for Use in Primary Care, Second Edition.  World Science writer [facility]). Score between 0-7:  no or low risk or alcohol related problems. Score between 8-15:  moderate risk of alcohol related problems. Score between 16-19:  high risk of alcohol related problems. Score 20 or above:  warrants further diagnostic evaluation for alcohol dependence and treatment.   CLINICAL FACTORS:   Alcohol/Substance Abuse/Dependencies Schizophrenia:   Less than 21 years old Paranoid or undifferentiated type   Musculoskeletal: Strength & Muscle Tone: within normal limits Gait & Station: normal Patient leans: N/A  Psychiatric Specialty Exam: physical exam performed in the emergency roomand agree with the findings.  Review of Systems  Respiratory: Positive for shortness of breath.   Cardiovascular: Positive for chest pain.  Psychiatric/Behavioral: Positive for substance abuse.  All other systems reviewed and are negative.   Blood pressure 100/67, pulse 103,  temperature 98.9 F (37.2 C), temperature source Oral, resp. rate 18, height  (1.854 m), weight 110.678 kg (244 lb), SpO2 99 %.Body mass index is 32.2 kg/(m^2).  General Appearance: Disheveled  Eye Contact::  Minimal  Speech:  Clear and Coherent  Volume:  Normal  Mood:  Depressed  Affect:  Blunt  Thought Process:  Goal Directed  Orientation:  Full (Time, Place, and Person)  Thought Content:  Delusions and Paranoid Ideation  Suicidal Thoughts:  No  Homicidal Thoughts:  No  Memory:  Immediate;   Poor Recent;   Poor Remote;   Poor  Judgement:  Impaired  Insight:  Shallow  Psychomotor Activity:  Decreased  Concentration:  Poor  Recall:  Poor  Fund of Knowledge:Poor  Language: Fair  Akathisia:  No  Handed:  Right  AIMS (if indicated):     Assets:  Communication Skills Desire for Improvement Financial Resources/Insurance Housing Intimacy Physical Health Resilience Social Support  Sleep:  Number of Hours: 9  Cognition: WNL  ADL's:  Intact    COGNITIVE FEATURES THAT CONTRIBUTE TO RISK:  None    SUICIDE RISK:   Mild:  Suicidal ideation of limited frequency, intensity, duration, and specificity.  There are no identifiable plans, no associated intent, mild dysphoria and related symptoms, good self-control (both objective and subjective assessment), few other risk factors, and identifiable protective factors, including available and accessible social support.  PLAN OF CARE: hospital admission, medication management, substance abuse counseling, discharge planning.  [name]  Is a 30 year old male with a history of schizophrenia admitted for psychotic break in the context of substance use.  1. Mood/Psychosis. We will continue oral Prolixin for psychosis. We will check with  his ACT team if he is due for Prolixin decanoate injection. We will continue lexapro for depression and Depakote for mood stabilization.  2. Smoking. Nicotine patch is available.  3.  Cocaine and  cannabis abuse. The patient minimizes his problems. He smokes crack and cannabis "recreationally". He does not wish to be treated.  4. Metabolic syndrome. Lipid panel, hemoglobin A1c, prolactin, and TSH are pending.  5. Disposition. He will be discharged to home with his mother. He will follow up with [facility] ACT team.   I certify that services furnished can reasonably be expected to improve the patient's condition.   Kristine Linea, MD 06/09/2015, 11:29 AM

## 2015-06-09 NOTE — H&P (Signed)
Psychiatric Admission Assessment Adult  Patient Identification: [name] MRN:  [id number] Date of Evaluation:  06/09/2015 Chief Complaint:  Schizophrenia Principal Diagnosis: Schizoaffective disorder, bipolar type (HCC) Diagnosis:   Patient Active Problem List   Diagnosis Date Noted  . 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 Schizoaffective disorder, bipolar type (HCC) [F25.0] 10/21/2014  . Tobacco use disorder [F17.200] 10/21/2014  . Cocaine use disorder, moderate, dependence (HCC) [F14.20] 10/21/2014  . Cannabis use disorder, moderate, dependence (HCC) [F12.20] 10/21/2014   History of Present Illness:  Identifying data.[name] is a 30 year old male with a history of schizophrenia.  Chief complaint. "I came here to rest."  History of present illness. Information was obtained from the patient and the chart. The patient has a long history of mental illness. He was diagnosed with schizophrenia or schizoaffective disorder 8 years ago. He has multiple psychiatric hospitalizations and multiple medication trials. The patient came to [facility] Emergency room twice recently complaining of chest pain shortness of breath and heightened anxiety. This was in the context of cannabis and cocaine use. The patient also was paranoid and delusional thinking that was paralyzed or even dead. During the interview today he denies any symptoms of depression, anxiety, or psychosis and still believes that he was admitted for his asthma and chest pain. He does not mind taking his psychiatric medications here but has not been able to name any of them. He initially denies using alcohol or drugs, when presented with the urine drug screen results he tells me that he uses them "recreationally".   Past psychiatric history. He denies ever attempting suicide. He has been hospitalized extensively at [facility], [facility], and [facility]. This is his first hospitalization at  [facility]. According to his mother, he was in jail from August through October. Folowing release from jail, he was hospitalized twice already. Both hospitalizations were 3 weeks long. The patient has been tried on multiple medications including Invega, Invega sustenna, Abilify, Depakote and Prolixin. The mother believes that he does well on Prolixin decanoate shots. There is a historyof medication noncompliance. He usually is agreeable to taking injections but does not follow up with oral medication. He is in the care of [facility] ACT team. He has currently been maintained on a combination of Prolixin, Depakote, and Lexapro. We do not know when his last injection of Prolixin decanoate was given.  Family psychiatric history. The patient denies any.  Social history. He is disabled from mental illness. He lives independently in transitional housing. His mother is the payee.  Total Time spent with patient: 1 hour  Past Psychiatric History: schizophrenia.  Risk to Self: Is patient at risk for suicide?: No Risk to Others:   Prior Inpatient Therapy:   Prior Outpatient Therapy:    Alcohol Screening: Patient refused Alcohol Screening Tool: Yes Brief Intervention: Patient declined brief intervention Substance Abuse History in the last 12 months:  Yes.   Consequences of Substance Abuse: Negative Previous Psychotropic Medications: Yes  Psychological Evaluations: No  Past Medical History:  Past Medical History  Diagnosis Date  . Hypertension   . Depression   . Schizophrenia [facility])     Past Surgical History  Procedure Laterality Date  . Back surgery     Family History:  Family History  Problem Relation Age of Onset  . Allergies Mother   . Asthma Mother    Family Psychiatric  History: none reported. Social History:  History  Alcohol Use No  History  Drug Use No    Comment: Previous drug use hx    Social History   Social History  . Marital  Status: Single    Spouse Name: N/A  . Number of Children: 0  . Years of Education: N/A   Occupational History  . on disability    Social History Main Topics  . Smoking status: Current Every Day Smoker -- 1.00 packs/day for 8 years    Types: Cigarettes  . Smokeless tobacco: None  . Alcohol Use: No  . Drug Use: No     Comment: Previous drug use hx  . Sexual Activity: Not Currently   Other Topics Concern  . None   Social History Narrative   Single. No children. Lives with mother.    Additional Social History:    Pain Medications:  (denies) Prescriptions:  (na) Over the Counter:  (na) History of alcohol / drug use?: No history of alcohol / drug abuse Longest period of sobriety (when/how long):  (na) Negative Consequences of Use:  (na) Withdrawal Symptoms:  (na) Name of Substance 1:  (denies) 1 - Age of First Use:  (denies) 1 - Amount (size/oz):  (denies) 1 - Frequency:  (denies) 1 - Duration:  (denies) 1 - Last Use / Amount:  (denies) Name of Substance 2:  (denies)                Allergies:  No Known Allergies Lab Results:  Results for orders placed or performed during the hospital encounter of 06/08/15 (from the past 48 hour(s))  Ethanol     Status: None   Collection Time: 06/08/15  8:33 AM  Result Value Ref Range   Alcohol, Ethyl (B) <5 <5 mg/dL    Comment:        LOWEST DETECTABLE LIMIT FOR SERUM ALCOHOL IS 5 mg/dL FOR MEDICAL PURPOSES ONLY   Urine rapid drug screen (hosp performed)     Status: Abnormal   Collection Time: 06/08/15 11:14 AM  Result Value Ref Range   Opiates NONE DETECTED NONE DETECTED   Cocaine POSITIVE (A) NONE DETECTED   Benzodiazepines NONE DETECTED NONE DETECTED   Amphetamines NONE DETECTED NONE DETECTED   Tetrahydrocannabinol POSITIVE (A) NONE DETECTED   Barbiturates NONE DETECTED NONE DETECTED    Comment:        DRUG SCREEN FOR MEDICAL PURPOSES ONLY.  IF CONFIRMATION IS NEEDED FOR ANY PURPOSE, NOTIFY LAB WITHIN 5 DAYS.         LOWEST DETECTABLE LIMITS FOR URINE DRUG SCREEN Drug Class       Cutoff (ng/mL) Amphetamine      1000 Barbiturate      200 Benzodiazepine   200 Tricyclics       300 Opiates          300 Cocaine          300 THC              50     Metabolic Disorder Labs:  No results found for: HGBA1C, MPG No results found for: PROLACTIN No results found for: CHOL, TRIG, HDL, CHOLHDL, VLDL, LDLCALC  Current Medications: Current Facility-Administered Medications  Medication Dose Route Frequency Provider Last Rate Last Dose  . benztropine (COGENTIN) tablet 0.5 mg  0.5 mg Oral BID Brandy Hale, MD   0.5 mg at 06/09/15 1037  . escitalopram (LEXAPRO) tablet 10 mg  10 mg Oral Daily Brandy Hale, MD   10 mg at 06/09/15 1036  . fluPHENAZine (PROLIXIN) tablet  10 mg  10 mg Oral QHS Brandy Hale, MD   10 mg at 06/08/15 2144  . OLANZapine (ZYPREXA) injection 5 mg  5 mg Intramuscular TID PRN Brandy Hale, MD       PTA Medications: Prescriptions prior to admission  Medication Sig Dispense Refill Last Dose  . aspirin EC 81 MG tablet Take 81 mg by mouth daily.   2 weeks ago  . benztropine (COGENTIN) 2 MG tablet Take 2 mg by mouth daily.  0 2 weeks ago  . divalproex (DEPAKOTE ER) 500 MG 24 hr tablet Take 500 mg by mouth daily.   2 weeks ago  . escitalopram (LEXAPRO) 20 MG tablet Take 20 mg by mouth daily.  0 2 weeks ago  . fluPHENAZine (PROLIXIN) 10 MG tablet Take 10 mg by mouth daily.  0 2 weeks ago  . fluPHENAZine decanoate (PROLIXIN) 25 MG/ML injection Inject into the muscle every 14 (fourteen) days.    2 weeks ago  . paliperidone (INVEGA SUSTENNA) 234 MG/1.5ML SUSP injection Inject 234 mg into the muscle once. Reported on 06/07/2015   2 weeks ago    Musculoskeletal: Strength & Muscle Tone: within normal limits Gait & Station: normal Patient leans: N/A  Psychiatric Specialty Exam: Physical Exam  Nursing note and vitals reviewed.   ROS  Blood pressure 100/67, pulse 103, temperature 98.9 F (37.2 C),  temperature source Oral, resp. rate 18, height  (1.854 m), weight 110.678 kg (244 lb), SpO2 99 %.Body mass index is 32.2 kg/(m^2).  See SRA.                                                  Sleep:  Number of Hours: 9     Treatment Plan Summary: Daily contact with patient to assess and evaluate symptoms and progress in treatment and Medication management   [name]  Is a 30 year old male with a history of schizophrenia admitted for psychotic break in the context of substance use.  1. Mood/Psychosis. We will continue oral Prolixin for psychosis. We will check with his ACT team if he is due for Prolixin decanoate injection. We will continue lexapro for depression and Depakote for mood stabilization.  2. Smoking. Nicotine patch is available.  3.  Cocaine and cannabis abuse. The patient minimizes his problems. He smokes crack and cannabis "recreationally". He does not wish to be treated.  4. Disposition. He will be discharged to home with his mother. He will follow up with [facility] ACT team.  Observation Level/Precautions:  15 minute checks  Laboratory:  CBC Chemistry Profile UDS UA  Psychotherapy:    Medications:    Consultations:    Discharge Concerns:    Estimated LOS:  Other:     I certify that inpatient services furnished can reasonably be expected to improve the patient's condition.   [name] 1/20/201711:52 AM

## 2015-06-10 DIAGNOSIS — F250 Schizoaffective disorder, bipolar type: Principal | ICD-10-CM

## 2015-06-10 LAB — TSH: TSH: 1.408 u[IU]/mL (ref 0.350–4.500)

## 2015-06-10 LAB — LIPID PANEL
CHOLESTEROL: 171 mg/dL (ref 0–200)
HDL: 30 mg/dL — ABNORMAL LOW (ref >40)
LDL CALC: 119 mg/dL — AB (ref 0–99)
Total CHOL/HDL Ratio: 5.7 RATIO
Triglycerides: 112 mg/dL (ref <150)
VLDL: 22 mg/dL (ref 0–40)

## 2015-06-10 LAB — HEMOGLOBIN A1C: Hgb A1c MFr Bld: 5.4 % (ref 4.0–6.0)

## 2015-06-10 NOTE — Plan of Care (Signed)
Problem: Ineffective individual coping Goal: STG: Patient will remain free from self harm Outcome: Progressing Patient remains free from self harm on the unit today

## 2015-06-10 NOTE — Progress Notes (Signed)
D:  Patient is oriented and alert when awakened on the unit this shift.  Patient is isolating in his room this morning and sleeping in bed.  Patient does not participate in groups today.  Patient denies suicidal ideation, homicidal ideation, auditory or visual hallucinations currently.  Patient will not get out of bed and walk to the medication room but is cooperative with medication administration in his bed.  Patient states that he is tired and needs to sleep today.  Patient gets up and joins the milieu and groups after lunch time. A:  Scheduled medications were administered as per MD orders.  Emotional support and encouragement were provided.  Patient maintained on q.15 minute safety checks.  Patient is informed to notify staff with any questions or concerns.   R:  No adverse medication reactions were noted during this shift.  Patient was cooperative with medication administration as long as medication is brought to him in his room.  Patient is calm and cooperative at this time.  Patient was interactive in the dayroom once he got up.  Patient contracts for safety at this time.  Patient remains safe at this time.

## 2015-06-10 NOTE — Progress Notes (Signed)
Patient was encouraged to shower today and he expressed willingness to do so.  Supplies were provided.  As of 1330, patient still had not showered.

## 2015-06-10 NOTE — Progress Notes (Signed)
[facility] MD Progress Note  06/10/2015 1:22 PM [name]  MRN:  [id number] Subjective:  The patient has a long history of mental illness. He was diagnosed with schizophrenia or schizoaffective disorder 8 years ago. He has multiple psychiatric hospitalizations and multiple medication trials. The patient came to [facility] Emergency room twice recently complaining of chest pain shortness of breath and heightened anxiety. The patient also was paranoid and delusional thinking that was paralyzed or even dead. Patient was sleeping but did respond to some brief questions. I asked him if he is having any physical or emotional problems and he stated no. He denied any suicidal or homicidal ideation. In review of the chart appears his presentation of psychosis may been exacerbated by substance use: cocaine and cannabis.    Principal Problem: Schizoaffective disorder, bipolar type (HCC) Diagnosis:   Patient Active Problem List   Diagnosis Date Noted  . 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 Schizoaffective disorder, bipolar type (HCC) [F25.0] 10/21/2014  . Tobacco use disorder [F17.200] 10/21/2014  . Cocaine use disorder, moderate, dependence (HCC) [F14.20] 10/21/2014  . Cannabis use disorder, moderate, dependence (HCC) [F12.20] 10/21/2014   Total Time spent with patient: 15 minutes  Past Psychiatric History:   Past Medical History:  Past Medical History  Diagnosis Date  . Hypertension   . Depression   . Schizophrenia [facility])     Past Surgical History  Procedure Laterality Date  . Back surgery     Family History:  Family History  Problem Relation Age of Onset  . Allergies Mother   . Asthma Mother    Family Psychiatric  History:  Social History:  History  Alcohol Use No     History  Drug Use No    Comment: Previous drug use hx    Social History   Social History  . Marital Status: Single    Spouse Name: N/A  . Number of Children: 0  . Years of Education: N/A    Occupational History  . on disability    Social History Main Topics  . Smoking status: Current Every Day Smoker -- 1.00 packs/day for 8 years    Types: Cigarettes  . Smokeless tobacco: None  . Alcohol Use: No  . Drug Use: No     Comment: Previous drug use hx  . Sexual Activity: Not Currently   Other Topics Concern  . None   Social History Narrative   Single. No children. Lives with mother.    Additional Social History:    Pain Medications:  (denies) Prescriptions:  (na) Over the Counter:  (na) History of alcohol / drug use?: No history of alcohol / drug abuse Longest period of sobriety (when/how long):  (na) Negative Consequences of Use:  (na) Withdrawal Symptoms:  (na) Name of Substance 1:  (denies) 1 - Age of First Use:  (denies) 1 - Amount (size/oz):  (denies) 1 - Frequency:  (denies) 1 - Duration:  (denies) 1 - Last Use / Amount:  (denies) Name of Substance 2:  (denies)                Sleep: Good  Appetite:  Good  Current Medications: Current Facility-Administered Medications  Medication Dose Route Frequency Provider Last Rate Last Dose  . acetaminophen (TYLENOL) tablet 650 mg  650 mg Oral Q6H PRN Jolanta B Pucilowska, MD      . alum & mag hydroxide-simeth (MAALOX/MYLANTA) 200-200-20 MG/5ML suspension 30 mL  30 mL Oral Q4H PRN Shari Prows, MD      .  aspirin chewable tablet 81 mg  81 mg Oral Daily Shari Prows, MD   81 mg at 06/10/15 0927  . benztropine (COGENTIN) tablet 0.5 mg  0.5 mg Oral BID Shari Prows, MD   0.5 mg at 06/10/15 0927  . divalproex (DEPAKOTE) DR tablet 500 mg  500 mg Oral 3 times per day Shari Prows, MD   500 mg at 06/10/15 1301  . escitalopram (LEXAPRO) tablet 10 mg  10 mg Oral Daily Shari Prows, MD   10 mg at 06/10/15 0927  . fluPHENAZine (PROLIXIN) tablet 10 mg  10 mg Oral QHS Jolanta B Pucilowska, MD   10 mg at 06/09/15 2200  . [START ON 06/13/2015] fluPHENAZine decanoate (PROLIXIN)  injection 50 mg  50 mg Intramuscular Q14 Days Jolanta B Pucilowska, MD      . magnesium hydroxide (MILK OF MAGNESIA) suspension 30 mL  30 mL Oral Daily PRN Jolanta B Pucilowska, MD      . nicotine (NICODERM CQ - dosed in mg/24 hours) patch 21 mg  21 mg Transdermal Q0600 Jolanta B Pucilowska, MD   21 mg at 06/10/15 0600  . OLANZapine (ZYPREXA) injection 5 mg  5 mg Intramuscular TID PRN Shari Prows, MD      . traZODone (DESYREL) tablet 100 mg  100 mg Oral QHS PRN Shari Prows, MD        Lab Results:  Results for orders placed or performed during the hospital encounter of 06/08/15 (from the past 48 hour(s))  Lipid panel     Status: Abnormal   Collection Time: 06/10/15  7:11 AM  Result Value Ref Range   Cholesterol 171 0 - 200 mg/dL   Triglycerides 409 <811 mg/dL   HDL 30 (L) >91 mg/dL   Total CHOL/HDL Ratio 5.7 RATIO   VLDL 22 0 - 40 mg/dL   LDL Cholesterol 478 (H) 0 - 99 mg/dL    Comment:        Total Cholesterol/HDL:CHD Risk Coronary Heart Disease Risk Table                     Men   Women  1/2 Average Risk   3.4   3.3  Average Risk       5.0   4.4  2 X Average Risk   9.6   7.1  3 X Average Risk  23.4   11.0        Use the calculated Patient Ratio above and the CHD Risk Table to determine the patient's CHD Risk.        ATP III CLASSIFICATION (LDL):  <100     mg/dL   Optimal  295-621  mg/dL   Near or Above                    Optimal  130-159  mg/dL   Borderline  308-657  mg/dL   High  >846     mg/dL   Very High   TSH     Status: None   Collection Time: 06/10/15  7:11 AM  Result Value Ref Range   TSH 1.408 0.350 - 4.500 uIU/mL    Physical Findings: AIMS: Facial and Oral Movements Muscles of Facial Expression: None, normal Lips and Perioral Area: None, normal Jaw: None, normal Tongue: None, normal,Extremity Movements Upper (arms, wrists, hands, fingers): None, normal Lower (legs, knees, ankles, toes): None, normal, Trunk Movements Neck, shoulders,  hips: None, normal, Overall Severity Severity of  abnormal movements (highest score from questions above): None, normal Incapacitation due to abnormal movements: None, normal Patient's awareness of abnormal movements (rate only patient's report): Aware, no distress, Dental Status Current problems with teeth and/or dentures?: No Does patient usually wear dentures?: No  CIWA:  CIWA-Ar Total: 1 COWS:  COWS Total Score: 0  Musculoskeletal: Strength & Muscle Tone: Patient was lying in bed, able to move left and right and shift his body out complications. Gait & Station: Patient was in bed Patient leans: N/A  Psychiatric Specialty Exam: Review of Systems  Psychiatric/Behavioral: Positive for hallucinations.  All other systems reviewed and are negative.   Blood pressure 100/67, pulse 103, temperature 98.9 F (37.2 C), temperature source Oral, resp. rate 18, height  (1.854 m), weight 110.678 kg (244 lb), SpO2 99 %.Body mass index is 32.2 kg/(m^2).  General Appearance: Fairly Groomed  Patent attorney::  Poor  Speech:  Impoverished  Volume:  Normal  Mood:   okay  Affect:  Flat  Thought Process:  Impoverished  Orientation:  Full (Time, Place, and Person)  Thought Content:  Delusions  Suicidal Thoughts:  No  Homicidal Thoughts:  No  Memory:  Immediate;   Fair Recent;   Fair Remote;   Fair  Judgement:  Impaired  Insight:  Lacking  Psychomotor Activity:  Normal  Concentration:  Poor  Recall:  Poor  Fund of Knowledge:Poor  Language: Poor  Akathisia:  Negative  Handed:  Right  AIMS (if indicated):     Assets:  Social Support  ADL's:  Intact  Cognition: Impaired,  Moderate  Sleep:  Number of Hours: 7.75   Treatment Plan Summary: Daily contact with patient to assess and evaluate symptoms and progress in treatment, Medication management and Plan   Mr. Trostel Is a 30 year old male with a history of schizophrenia admitted for psychotic break in the context of substance use.  1.  Mood/Psychosis. We will continue oral Prolixin for psychosis. We will check with his ACT team if he is due for Prolixin decanoate injection. We will continue lexapro for depression and Depakote for mood stabilization.  2. Smoking. Nicotine patch is available.  3. Cocaine and cannabis abuse. The patient minimizes his problems. He smokes crack and cannabis "recreationally". He does not wish to be treated.  4. Disposition. He will be discharged to home with his mother. He will follow up with [facility] ACT team. Wallace Going 06/10/2015, 1:22 PM

## 2015-06-10 NOTE — [facility] Group Notes (Signed)
[facility] LCSW Group Therapy  06/10/2015 11:08 AM Late entry for 06/09/15  Type of Therapy:  Group Therapy  Participation Level:  Did Not Attend  Modes of Intervention:  Discussion, Education, Socialization and Support  Summary of Progress/Problems: Balance in life: Patients will discuss the concept of balance and how it looks and feels to be unbalanced. Pt will identify areas in their life that is unbalanced and ways to become more balanced.    Romar Woodrick L Hollyann Pablo MSW, LCSWA  06/10/2015, 11:08 AM

## 2015-06-10 NOTE — [facility] Group Notes (Signed)
[facility] LCSW Group Therapy  06/10/2015 1:39 PM  Type of Therapy:  Group Therapy  Participation Level:  Did Not Attend  Modes of Intervention:  Discussion, Education, Socialization and Support  Summary of Progress/Problems: Feelings around Relapse. Group members discussed the meaning of relapse and shared personal stories of relapse, how it affected them and others, and how they perceived themselves during this time. Group members were encouraged to identify triggers, warning signs and coping skills used when facing the possibility of relapse. Social supports were discussed and explored in detail.    Marylene Masek L Lynanne Delgreco MSW, LCSWA  06/10/2015, 1:39 PM   

## 2015-06-10 NOTE — Plan of Care (Signed)
Problem: Ineffective individual coping Goal: LTG: Patient will report a decrease in negative feelings Outcome: Progressing Patient reports feeling better today than he did yesterday     

## 2015-06-10 NOTE — Progress Notes (Signed)
D: Pt denies SI/HI/AVH, affect is flat and sad, mood is irritable, he is guarded with information, and unwillingly to participate in plan of care. Patient withdraws to himself and is not interacting with peers and staff.   A: Pt was offered support and encouragement. Pt was offered scheduled medications. Pt was encouraged to attend groups. Q 15 minute checks were done for safety.  R:Pt did not attend evening groups. Pt is not complaint with medication. Pt is not receptive to treatment. 15 minutes checks maintained on unit, will continue to monitor.

## 2015-06-11 LAB — PROLACTIN: Prolactin: 23 ng/mL — ABNORMAL HIGH (ref 4.0–15.2)

## 2015-06-11 NOTE — Progress Notes (Signed)
Patient with appropriate affect, cooperative behavior with meals, meds and plan of care. No SI/HI at this time. Verbalizes needs appropriately with staff. States " I am ready to work on discharge plan and would like to discharge tomorrow". Encouraged to attend therapy groups and learn and initiate coping skills for management of stressors and diagnosis, safety maintained.

## 2015-06-11 NOTE — Plan of Care (Signed)
Problem: Alteration in mood Goal: LTG-Patient reports reduction in suicidal thoughts (Patient reports reduction in suicidal thoughts and is able to verbalize a safety plan for whenever patient is feeling suicidal)  Outcome: Progressing Patient denies SI/HI.      

## 2015-06-11 NOTE — Progress Notes (Signed)
Patient requested and signed an 72 hour form. Voluntary status, plan of care, 72 hour form, rescinding of form  and discharge planning explained to patient. He verbalizes understanding.

## 2015-06-11 NOTE — Progress Notes (Signed)
[facility] MD Progress Note  06/11/2015 1:55 PM [name]  MRN:  [id number] Subjective:  The patient has a long history of mental illness. He was diagnosed with schizophrenia or schizoaffective disorder 8 years ago. He has multiple psychiatric hospitalizations and multiple medication trials. The patient came to [facility] Emergency room twice recently complaining of chest pain shortness of breath and heightened anxiety. Today the patient is very interactive on the unit and moving around. He spoke with me at length about what the meaning of a 3 day Hessie Knows was in regards to voluntary hospitalization. Spent time explaining to him that that did not start on the day he was admitted that with starting the day he signed a paper requesting discharge. Patient seemed to be adamant about discharge today however when I explained to him that he might likely be assessed and discharge prior to needing to request a 3 day notice. In addition nursing also explain the process. He denies any physical issues at this time.   Principal Problem: Schizoaffective disorder, bipolar type (HCC) Diagnosis:   Patient Active Problem List   Diagnosis Date Noted  . 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 Schizoaffective disorder, bipolar type (HCC) [F25.0] 10/21/2014  . Tobacco use disorder [F17.200] 10/21/2014  . Cocaine use disorder, moderate, dependence (HCC) [F14.20] 10/21/2014  . Cannabis use disorder, moderate, dependence (HCC) [F12.20] 10/21/2014   Total Time spent with patient: 15 minutes  Past Psychiatric History:   Past Medical History:  Past Medical History  Diagnosis Date  . Hypertension   . Depression   . Schizophrenia [facility])     Past Surgical History  Procedure Laterality Date  . Back surgery     Family History:  Family History  Problem Relation Age of Onset  . Allergies Mother   . Asthma Mother    Family Psychiatric  History:  Social History:  History  Alcohol Use No      History  Drug Use No    Comment: Previous drug use hx    Social History   Social History  . Marital Status: Single    Spouse Name: N/A  . Number of Children: 0  . Years of Education: N/A   Occupational History  . on disability    Social History Main Topics  . Smoking status: Current Every Day Smoker -- 1.00 packs/day for 8 years    Types: Cigarettes  . Smokeless tobacco: None  . Alcohol Use: No  . Drug Use: No     Comment: Previous drug use hx  . Sexual Activity: Not Currently   Other Topics Concern  . None   Social History Narrative   Single. No children. Lives with mother.    Additional Social History:    Pain Medications:  (denies) Prescriptions:  (na) Over the Counter:  (na) History of alcohol / drug use?: No history of alcohol / drug abuse Longest period of sobriety (when/how long):  (na) Negative Consequences of Use:  (na) Withdrawal Symptoms:  (na) Name of Substance 1:  (denies) 1 - Age of First Use:  (denies) 1 - Amount (size/oz):  (denies) 1 - Frequency:  (denies) 1 - Duration:  (denies) 1 - Last Use / Amount:  (denies) Name of Substance 2:  (denies)                Sleep: Good  Appetite:  Good  Current Medications: Current Facility-Administered Medications  Medication Dose Route Frequency Provider Last Rate Last Dose  . acetaminophen (TYLENOL)  tablet 650 mg  650 mg Oral Q6H PRN Jolanta B Pucilowska, MD      . alum & mag hydroxide-simeth (MAALOX/MYLANTA) 200-200-20 MG/5ML suspension 30 mL  30 mL Oral Q4H PRN Jolanta B Pucilowska, MD   30 mL at 06/11/15 1155  . aspirin chewable tablet 81 mg  81 mg Oral Daily Jolanta B Pucilowska, MD   81 mg at 06/11/15 0858  . benztropine (COGENTIN) tablet 0.5 mg  0.5 mg Oral BID Shari Prows, MD   0.5 mg at 06/11/15 0859  . divalproex (DEPAKOTE) DR tablet 500 mg  500 mg Oral 3 times per day Shari Prows, MD   500 mg at 06/11/15 0651  . escitalopram (LEXAPRO) tablet 10 mg  10 mg Oral Daily  Shari Prows, MD   10 mg at 06/11/15 0858  . fluPHENAZine (PROLIXIN) tablet 10 mg  10 mg Oral QHS Jolanta B Pucilowska, MD   10 mg at 06/09/15 2200  . [START ON 06/13/2015] fluPHENAZine decanoate (PROLIXIN) injection 50 mg  50 mg Intramuscular Q14 Days Jolanta B Pucilowska, MD      . magnesium hydroxide (MILK OF MAGNESIA) suspension 30 mL  30 mL Oral Daily PRN Jolanta B Pucilowska, MD      . nicotine (NICODERM CQ - dosed in mg/24 hours) patch 21 mg  21 mg Transdermal Q0600 Jolanta B Pucilowska, MD   21 mg at 06/10/15 0600  . OLANZapine (ZYPREXA) injection 5 mg  5 mg Intramuscular TID PRN Shari Prows, MD      . traZODone (DESYREL) tablet 100 mg  100 mg Oral QHS PRN Shari Prows, MD        Lab Results:  Results for orders placed or performed during the hospital encounter of 06/08/15 (from the past 48 hour(s))  Lipid panel     Status: Abnormal   Collection Time: 06/10/15  7:11 AM  Result Value Ref Range   Cholesterol 171 0 - 200 mg/dL   Triglycerides 161 <096 mg/dL   HDL 30 (L) >04 mg/dL   Total CHOL/HDL Ratio 5.7 RATIO   VLDL 22 0 - 40 mg/dL   LDL Cholesterol 540 (H) 0 - 99 mg/dL    Comment:        Total Cholesterol/HDL:CHD Risk Coronary Heart Disease Risk Table                     Men   Women  1/2 Average Risk   3.4   3.3  Average Risk       5.0   4.4  2 X Average Risk   9.6   7.1  3 X Average Risk  23.4   11.0        Use the calculated Patient Ratio above and the CHD Risk Table to determine the patient's CHD Risk.        ATP III CLASSIFICATION (LDL):  <100     mg/dL   Optimal  981-191  mg/dL   Near or Above                    Optimal  130-159  mg/dL   Borderline  478-295  mg/dL   High  >621     mg/dL   Very High   Hemoglobin A1c     Status: None   Collection Time: 06/10/15  7:11 AM  Result Value Ref Range   Hgb A1c MFr Bld 5.4 4.0 - 6.0 %  Prolactin  Status: Abnormal   Collection Time: 06/10/15  7:11 AM  Result Value Ref Range   Prolactin  23.0 (H) 4.0 - 15.2 ng/mL    Comment: (NOTE) Performed At: [facility] [address] [place], [place] [id number] Mila Homer MD WU:[id number]   TSH     Status: None   Collection Time: 06/10/15  7:11 AM  Result Value Ref Range   TSH 1.408 0.350 - 4.500 uIU/mL    Physical Findings: AIMS: Facial and Oral Movements Muscles of Facial Expression: None, normal Lips and Perioral Area: None, normal Jaw: None, normal Tongue: None, normal,Extremity Movements Upper (arms, wrists, hands, fingers): None, normal Lower (legs, knees, ankles, toes): None, normal, Trunk Movements Neck, shoulders, hips: None, normal, Overall Severity Severity of abnormal movements (highest score from questions above): None, normal Incapacitation due to abnormal movements: None, normal Patient's awareness of abnormal movements (rate only patient's report): Aware, no distress, Dental Status Current problems with teeth and/or dentures?: No Does patient usually wear dentures?: No  CIWA:  CIWA-Ar Total: 1 COWS:  COWS Total Score: 0  Musculoskeletal: Strength & Muscle Tone: Patient was lying in bed, able to move left and right and shift his body out complications. Gait & Station: Patient was in bed Patient leans: N/A  Psychiatric Specialty Exam: Review of Systems  Psychiatric/Behavioral: Positive for hallucinations.  All other systems reviewed and are negative.   Blood pressure 111/73, pulse 68, temperature 98.7 F (37.1 C), temperature source Oral, resp. rate 18, height  (1.854 m), weight 110.678 kg (244 lb), SpO2 99 %.Body mass index is 32.2 kg/(m^2).  General Appearance: Fairly Groomed  Patent attorney::  Poor  Speech:  Impoverished  Volume:  Normal  Mood:   okay  Affect:  Flat  Thought Process:  Impoverished  Orientation:  Full (Time, Place, and Person)  Thought Content:  Delusions  Suicidal Thoughts:  No  Homicidal Thoughts:  No  Memory:  Immediate;   Fair Recent;   Fair Remote;    Fair  Judgement:  Impaired  Insight:  Lacking  Psychomotor Activity:  Normal  Concentration:  Poor  Recall:  Poor  Fund of Knowledge:Poor  Language: Poor  Akathisia:  Negative  Handed:  Right  AIMS (if indicated):     Assets:  Social Support  ADL's:  Intact  Cognition: Impaired,  Moderate  Sleep:  Number of Hours: 7.75   Treatment Plan Summary: Daily contact with patient to assess and evaluate symptoms and progress in treatment, Medication management and Plan   [name] Is a 30 year old male with a history of schizophrenia admitted for psychotic break in the context of substance use.  1. Mood/Psychosis. We will continue oral Prolixin for psychosis. We will check with his ACT team if he is due for Prolixin decanoate injection. We will continue lexapro for depression and Depakote for mood stabilization.  2. Smoking. Nicotine patch is available.  3. Cocaine and cannabis abuse. The patient minimizes his problems. He smokes crack and cannabis "recreationally". He does not wish to be treated.  4. Disposition. He will be discharged to home with his mother. He will follow up with [facility] ACT team. Wallace Going 06/11/2015, 1:55 PM

## 2015-06-11 NOTE — Plan of Care (Signed)
Problem: Consults Goal: [facility] General Treatment Patient Education Outcome: Progressing Cooperative with plan of care.      

## 2015-06-11 NOTE — Progress Notes (Signed)
D: Pt denies SI/HI/AVH. Pt is irritable, angry and uncooperative with treatment plan. Pt stated" I wants to left alone" and adamantly refused  his medication even with  multiple attempts by Clinical research associate.Patient isolates to room and  he is not interacting with peers and staff.  A: Pt was offered support and encouragement. Pt was offered scheduled medications. Pt was encouraged to attend groups. Q 15 minute checks were done for safety.  R:Pt did not attends group. Pt is not complaint with medication. Pt is not receptive to treatment,15 minutes checks maintainedt.

## 2015-06-11 NOTE — [facility] Group Notes (Signed)
[facility] LCSW Group Therapy  06/11/2015 2:38 PM  Type of Therapy:  Group Therapy  Participation Level:  Did Not Attend  Modes of Intervention:  Discussion, Education, Socialization and Support  Summary of Progress/Problems: Mindfulness: Patient discussed mindfulness and relaxing techniques and why they are beneficial. Pt discussed ways to incorporate mindfulness in their lives. Pt practiced a mindfulness techique and discussed how it made them feel.   [name] L Amillion Macchia MSW, LCSWA  06/11/2015, 2:38 PM  

## 2015-06-12 LAB — VALPROIC ACID LEVEL: VALPROIC ACID LVL: 58 ug/mL (ref 50.0–100.0)

## 2015-06-12 LAB — AMMONIA: Ammonia: 24 umol/L (ref 9–35)

## 2015-06-12 MED ORDER — TRAZODONE HCL 100 MG PO TABS: 100.0000 mg | ORAL_TABLET | Freq: Every evening | ORAL | Status: DC | PRN

## 2015-06-12 MED ORDER — FLUPHENAZINE DECANOATE 25 MG/ML IJ SOLN
50.0000 mg | INTRAMUSCULAR | Status: DC
  Administered 2015-06-12: 50 mg via INTRAMUSCULAR
  Filled 2015-06-12: qty 2

## 2015-06-12 MED ORDER — DIVALPROEX SODIUM 500 MG PO DR TAB: 1500.0000 mg | DELAYED_RELEASE_TABLET | Freq: Every day | ORAL | Status: DC

## 2015-06-12 NOTE — Tx Team (Signed)
Interdisciplinary Treatment Plan Update (Adult)  Date:  06/12/2015 Time Reviewed:  10:56 AM  Progress in Treatment: Attending groups: Yes. Participating in groups:  Yes. Taking medication as prescribed:  Yes. Tolerating medication:  Yes. Family/Significant othe contact made:  Yes, individual(s) contacted:  patient's mother Patient understands diagnosis:  Yes. Discussing patient identified problems/goals with staff:  Yes. Medical problems stabilized or resolved:  Yes. Denies suicidal/homicidal ideation: Yes. Issues/concerns per patient self-inventory:  Yes. Other:  New problem(s) identified: No, Describe:  none reported  Discharge Plan or Barriers: Patient will stabilize on medications and discharge home with follow up scheduled with his ACTT. Patient will be picked up by his mother at discharge.  Reason for Continuation of Hospitalization: Aggression Delusions  Medication stabilization Other; describe paranoia  Comments:  Estimated length of stay: 0 days will discharge today Monday 06/12/15  New goal(s):  Review of initial/current patient goals per problem list:   1.  Goal(s):Participate in aftercare planing  Met:  Yes  Target date: by discharge  As evidenced by: Patient will participate in aftercare plan AEB aftercare provider and housing plan identified at discharge  2.  Goal (s):decrease psychosis  Met:  Yes  Target date:at discharge  As evidenced by: patient demonstrates decreased signs and symptoms of psychosis   Attendees: Physician:  Orson Slick, MD 1/23/201710:56 AM  Nursing:   Tyler Pita, RN 1/23/201710:56 AM  Other:  Carmell Austria, [place] 1/23/201710:56 AM  Other:   1/23/201710:56 AM  Other:   1/23/201710:56 AM  Other:  1/23/201710:56 AM  Other:  1/23/201710:56 AM  Other:  1/23/201710:56 AM  Other:  1/23/201710:56 AM  Other:  1/23/201710:56 AM  Other:  1/23/201710:56 AM  Other:   1/23/201710:56 AM   Scribe for Treatment Team:   Keene Breath, MSW, LCSWA [phone]  06/12/2015, 10:56 AM

## 2015-06-12 NOTE — Progress Notes (Signed)
  [facility] Adult Case Management Discharge Plan :  Will you be returning to the same living situation after discharge:  Yes,  home to his apartment  At discharge, do you have transportation home?: Yes,  patient's mother will pick up Do you have the ability to pay for your medications: Yes,  patient has Medicaid and Medicare  Release of information consent forms completed and in the chart;  Patient's signature needed at discharge.  Patient to Follow up at: Follow-up Information    Follow up with [facility].   Why:  For follow-up care appt ACT team will follow up with patient in the community and is notified of discharge today 06/12/15   Contact information:   [address] [place], [place] Ph [phone] Ph [phone] Fax [phone]       Next level of care provider has access to [facility] Link:no  Safety Planning and Suicide Prevention discussed: Yes,  SPE discussed with patient and Salem Caster (mother) [phone]   Have you used any form of tobacco in the last 30 days? (Cigarettes, Smokeless Tobacco, Cigars, and/or Pipes): Yes  Has patient been referred to the Quitline?: Patient refused referral  Patient has been referred for addiction treatment: Pt. refused referral but is connected with ACT and states he will go to AA/NA meetings. He has a list at home and this is confirmed with his mother.  Lulu Riding, MSW, Theresia Majors [phone] 06/12/2015, 12:06 PM

## 2015-06-12 NOTE — Progress Notes (Signed)
Patient denies any issues during assessment and states "Im good". Patient refuses night medications and says he will take them tomorrow. Patient isolates to room during shift. No unsafe behaviors noted. Will continue to monitor.

## 2015-06-12 NOTE — Discharge Summary (Signed)
Physician Discharge Summary Note  Patient:  [name] is an 30 y.o., male MRN:  [id number] DOB:  1985/07/25 Patient phone:  [phone] (home)  Patient address:   [address] [place] [address],  Total Time spent with patient: 30 minutes  Date of Admission:  06/08/2015 Date of Discharge: 06/12/2915  Reason for Admission:  Psychotic break.  Identifying data.Mr.Tanabe is a 30 year old male with a history of schizophrenia.  Chief complaint. "I came here to rest."  History of present illness. Information was obtained from the patient and the chart. The patient has a long history of mental illness. He was diagnosed with schizophrenia or schizoaffective disorder 8 years ago. He has multiple psychiatric hospitalizations and multiple medication trials. The patient came to [facility] Emergency room twice recently complaining of chest pain shortness of breath and heightened anxiety. This was in the context of cannabis and cocaine use. The patient also was paranoid and delusional thinking that was paralyzed or even dead. During the interview today he denies any symptoms of depression, anxiety, or psychosis and still believes that he was admitted for his asthma and chest pain. He does not mind taking his psychiatric medications here but has not been able to name any of them. He initially denies using alcohol or drugs, when presented with the urine drug screen results he tells me that he uses them "recreationally".   Past psychiatric history. He denies ever attempting suicide. He has been hospitalized extensively at [facility], [facility], and [facility]. This is his first hospitalization at [facility]. According to his mother, he was in jail from August through October. Folowing release from jail, he was hospitalized twice already. Both hospitalizations were 3 weeks long. The patient has been tried on multiple medications including Invega, Invega  sustenna, Abilify, Depakote and Prolixin. The mother believes that he does well on Prolixin decanoate shots. There is a historyof medication noncompliance. He usually is agreeable to taking injections but does not follow up with oral medication. He is in the care of [facility] team. He has currently been maintained on a combination of Prolixin, Depakote, and Lexapro. We do not know when his last injection of Prolixin decanoate was given.  Family psychiatric history. The patient denies any.  Social history. He is disabled from mental illness. He lives independently in transitional housing. He is married. His mother is the payee.  Principal Problem: Schizoaffective disorder, bipolar type [facility]) Discharge Diagnoses: Patient Active Problem List   Diagnosis Date Noted  . 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 Schizoaffective disorder, bipolar type (HCC) [F25.0] 10/21/2014  . Tobacco use disorder [F17.200] 10/21/2014  . Cocaine use disorder, moderate, dependence (HCC) [F14.20] 10/21/2014  . Cannabis use disorder, moderate, dependence (HCC) [F12.20] 10/21/2014    Past Psychiatric History: Schizophrenia.  Past Medical History:  Past Medical History  Diagnosis Date  . Hypertension   . Depression   . Schizophrenia [facility] [place] [facility])     Past Surgical History  Procedure Laterality Date  . Back surgery     Family History:  Family History  Problem Relation Age of Onset  . Allergies Mother   . Asthma Mother    Family Psychiatric  History: None reported. Social History:  History  Alcohol Use No     History  Drug Use No    Comment: Previous drug use hx    Social History   Social History  . Marital Status: Single    Spouse  Name: N/A  . Number of Children: 0  . Years of Education: N/A   Occupational History  . on disability    Social History Main Topics  . Smoking status: Current Every Day Smoker -- 1.00 packs/day for 8 years    Types: Cigarettes  .  Smokeless tobacco: None  . Alcohol Use: No  . Drug Use: No     Comment: Previous drug use hx  . Sexual Activity: Not Currently   Other Topics Concern  . None   Social History Narrative   Single. No children. Lives with mother.     Hospital Course:    Mr. Read Is a 30 year old male with a history of schizophrenia admitted for psychotic break in the context of substance use.  1. Mood/Psychosis. We continued oral Prolixin for psychosis. His Prolixin decanoate injection is due on 06/13/2015. We gave it today to improve compliance. We continued lexapro for depression and Depakote for mood stabilization. We will increase Depakote to 1500 mg daily,  valproic acid level was 58 on the day of discharge.   2. Smoking. Nicotine patch is available.  3. Cocaine and cannabis abuse. The patient minimizes his problems. He smokes crack and cannabis "recreationally". He does not wish to be treated.  4.  Metabolic syndrome. Lipid panel, hemoglobin A1c and TSH were normal. Prolactin was 23.   5. Insomnia. He responded well to trazodone.   6. Disposition. He was discharged to home with his mother. He will follow up with [facility] team.  Physical Findings: AIMS: Facial and Oral Movements Muscles of Facial Expression: None, normal Lips and Perioral Area: None, normal Jaw: None, normal Tongue: None, normal,Extremity Movements Upper (arms, wrists, hands, fingers): None, normal Lower (legs, knees, ankles, toes): None, normal, Trunk Movements Neck, shoulders, hips: None, normal, Overall Severity Severity of abnormal movements (highest score from questions above): None, normal Incapacitation due to abnormal movements: None, normal Patient's awareness of abnormal movements (rate only patient's report): Aware, no distress, Dental Status Current problems with teeth and/or dentures?: No Does patient usually wear dentures?: No  CIWA:  CIWA-Ar Total: 1 COWS:  COWS Total Score:  0  Musculoskeletal: Strength & Muscle Tone: within normal limits Gait & Station: normal Patient leans: N/A  Psychiatric Specialty Exam: Review of Systems  All other systems reviewed and are negative.   Blood pressure 102/48, pulse 71, temperature 98.4 F (36.9 C), temperature source Oral, resp. rate 18, height  (1.854 m), weight 110.678 kg (244 lb), SpO2 99 %.Body mass index is 32.2 kg/(m^2).  See SRA.                                                  Sleep:  Number of Hours: 9   Have you used any form of tobacco in the last 30 days? (Cigarettes, Smokeless Tobacco, Cigars, and/or Pipes): Yes  Has this patient used any form of tobacco in the last 30 days? (Cigarettes, Smokeless Tobacco, Cigars, and/or Pipes) Yes, Yes, A prescription for an FDA-approved tobacco cessation medication was offered at discharge and the patient refused  Metabolic Disorder Labs:  Lab Results  Component Value Date   HGBA1C 5.4 06/10/2015   Lab Results  Component Value Date   PROLACTIN 23.0* 06/10/2015   Lab Results  Component Value Date   CHOL 171 06/10/2015   TRIG 112 06/10/2015  HDL 30* 06/10/2015   CHOLHDL 5.7 06/10/2015   VLDL 22 06/10/2015   LDLCALC 119* 06/10/2015    See Psychiatric Specialty Exam and Suicide Risk Assessment completed by Attending Physician prior to discharge.  Discharge destination:  Home  Is patient on multiple antipsychotic therapies at discharge:  No   Has Patient had three or more failed trials of antipsychotic monotherapy by history:  No  Recommended Plan for Multiple Antipsychotic Therapies: NA  Discharge Instructions    Diet - low sodium heart healthy    Complete by:  As directed      Increase activity slowly    Complete by:  As directed             Medication List    STOP taking these medications        divalproex 500 MG 24 hr tablet  Commonly known as:  DEPAKOTE ER  Replaced by:  divalproex 500 MG DR tablet     INVEGA  SUSTENNA 234 MG/1.5ML Susp injection  Generic drug:  paliperidone      TAKE these medications      Indication   aspirin EC 81 MG tablet  Take 81 mg by mouth daily.      benztropine 2 MG tablet  Commonly known as:  COGENTIN  Take 2 mg by mouth daily.      divalproex 500 MG DR tablet  Commonly known as:  DEPAKOTE  Take 3 tablets (1,500 mg total) by mouth at bedtime.   Indication:  Schizophrenia     escitalopram 20 MG tablet  Commonly known as:  LEXAPRO  Take 20 mg by mouth daily.      fluPHENAZine 10 MG tablet  Commonly known as:  PROLIXIN  Take 10 mg by mouth daily.      fluPHENAZine decanoate 25 MG/ML injection  Commonly known as:  PROLIXIN  Inject into the muscle every 14 (fourteen) days.      traZODone 100 MG tablet  Commonly known as:  DESYREL  Take 1 tablet (100 mg total) by mouth at bedtime as needed for sleep.   Indication:  Trouble Sleeping           Follow-up Information    Follow up with [facility].   Why:  For follow-up care appt ACT team will follow up with patient in the community and is notified of discharge today 06/12/15   Contact information:   [address] [place], [place] Ph [phone] Ph [phone] Fax [phone]       Follow-up recommendations:  Activity:  As tolerated. Diet:  Low sodium heart healthy. Other:  Keep follow-up appointments.  Comments:    Signed: Jolanta Pucilowska 06/12/2015, 12:03 PM

## 2015-06-12 NOTE — Plan of Care (Signed)
Problem: Ineffective individual coping Goal: STG: Patient will remain free from self harm Outcome: Progressing Patient safe on unit.

## 2015-06-12 NOTE — Progress Notes (Signed)
Patient denies SI/HI, denies A/V hallucinations. Patient verbalizes understanding of discharge instructions, follow up care and prescriptions. Patient given all belongings from  locker. Patient escorted out by staff, transported by family. 

## 2015-06-12 NOTE — [facility] Suicide Risk Assessment (Signed)
[facility] INPATIENT:  Family/Significant Other Suicide Prevention Education  Suicide Prevention Education:  Education Completed; [name] (mother) [phone] has been identified by the patient as the family member/significant other with whom the patient will be residing, and identified as the person(s) who will aid the patient in the event of a mental health crisis (suicidal ideations/suicide attempt).  With written consent from the patient, the family member/significant other has been provided the following suicide prevention education, prior to the and/or following the discharge of the patient.  The suicide prevention education provided includes the following:  Suicide risk factors  Suicide prevention and interventions  National Suicide Hotline telephone number  [facility] assessment telephone number  [facility] Emergency Assistance 911  [facility] and/or Residential Mobile Crisis Unit telephone number  Request made of family/significant other to:  Remove weapons (e.g., guns, rifles, knives), all items previously/currently identified as safety concern.    Remove drugs/medications (over-the-counter, prescriptions, illicit drugs), all items previously/currently identified as a safety concern.  The family member/significant other verbalizes understanding of the suicide prevention education information provided.  The family member/significant other agrees to remove the items of safety concern listed above.  Lulu Riding, MSW, Theresia Majors  [phone] 06/12/2015, 9:47 AM

## 2015-06-12 NOTE — [facility] Suicide Risk Assessment (Signed)
[facility] Discharge Suicide Risk Assessment   Principal Problem: Schizoaffective disorder, bipolar type [facility]) Discharge Diagnoses:  Patient Active Problem List   Diagnosis Date Noted  . 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 Schizoaffective disorder, bipolar type (HCC) [F25.0] 10/21/2014  . Tobacco use disorder [F17.200] 10/21/2014  . Cocaine use disorder, moderate, dependence (HCC) [F14.20] 10/21/2014  . Cannabis use disorder, moderate, dependence (HCC) [F12.20] 10/21/2014    Total Time spent with patient: 30 minutes  Musculoskeletal: Strength & Muscle Tone: within normal limits Gait & Station: normal Patient leans: N/A  Psychiatric Specialty Exam: Review of Systems  All other systems reviewed and are negative.   Blood pressure 102/48, pulse 71, temperature 98.4 F (36.9 C), temperature source Oral, resp. rate 18, height  (1.854 m), weight 110.678 kg (244 lb), SpO2 99 %.Body mass index is 32.2 kg/(m^2).  General Appearance: Casual  Eye Contact::  Good  Speech:  Clear and Coherent409  Volume:  Normal  Mood:  Euthymic  Affect:  Appropriate  Thought Process:  Goal Directed  Orientation:  Full (Time, Place, and Person)  Thought Content:  WDL  Suicidal Thoughts:  No  Homicidal Thoughts:  No  Memory:  Immediate;   Fair Recent;   Fair Remote;   Fair  Judgement:  Impaired  Insight:  Shallow  Psychomotor Activity:  Normal  Concentration:  Fair  Recall:  [employer] of Knowledge:Fair  Language: Fair  Akathisia:  No  Handed:  Right  AIMS (if indicated):     Assets:  Communication Skills Desire for Improvement Financial Resources/Insurance Housing Intimacy Physical Health Resilience Social Support  Sleep:  Number of Hours: 9  Cognition: WNL  ADL's:  Intact   Mental Status Per Nursing Assessment::   On Admission:     Demographic Factors:  Male  Loss Factors: NA  Historical Factors: Impulsivity  Risk Reduction Factors:   Sense of  responsibility to family, Living with another person, especially a relative, Positive social support and Positive therapeutic relationship  Continued Clinical Symptoms:  Alcohol/Substance Abuse/Dependencies Schizophrenia:   Less than 98 years old Paranoid or undifferentiated type  Cognitive Features That Contribute To Risk:  None    Suicide Risk:  Minimal: No identifiable suicidal ideation.  Patients presenting with no risk factors but with morbid ruminations; may be classified as minimal risk based on the severity of the depressive symptoms  Follow-up Information    Please follow up.   Contact information:   [address] [place], [place] [phone] Ph [phone] Fax [phone]       Plan Of Care/Follow-up recommendations:  Activity:  As tolerated. Diet:  Low sodium heart healthy. Other:  Keep follow-up appointments.  Kristine Linea, MD 06/12/2015, 10:41 AM

## 2015-07-14 ENCOUNTER — Encounter (HOSPITAL_COMMUNITY): Payer: Self-pay

## 2015-07-14 ENCOUNTER — Emergency Department (HOSPITAL_COMMUNITY)
Admission: EM | Admit: 2015-07-14 | Discharge: 2015-07-14 | Disposition: A | Discharge status: Home / Self Care | Payer: Medicare Other | Attending: Emergency Medicine | Admitting: Emergency Medicine

## 2015-07-14 DIAGNOSIS — F329 Major depressive disorder, single episode, unspecified: Secondary | ICD-10-CM | POA: Diagnosis not present

## 2015-07-14 DIAGNOSIS — F17210 Nicotine dependence, cigarettes, uncomplicated: Secondary | ICD-10-CM | POA: Insufficient documentation

## 2015-07-14 DIAGNOSIS — F1420 Cocaine dependence, uncomplicated: Secondary | ICD-10-CM | POA: Diagnosis present

## 2015-07-14 DIAGNOSIS — I10 Essential (primary) hypertension: Secondary | ICD-10-CM | POA: Insufficient documentation

## 2015-07-14 DIAGNOSIS — R52 Pain, unspecified: Secondary | ICD-10-CM | POA: Insufficient documentation

## 2015-07-14 DIAGNOSIS — F1210 Cannabis abuse, uncomplicated: Secondary | ICD-10-CM | POA: Insufficient documentation

## 2015-07-14 LAB — RAPID URINE DRUG SCREEN, HOSP PERFORMED
AMPHETAMINES: NOT DETECTED
Barbiturates: NOT DETECTED
Benzodiazepines: NOT DETECTED
COCAINE: POSITIVE — AB
OPIATES: NOT DETECTED
TETRAHYDROCANNABINOL: POSITIVE — AB

## 2015-07-14 NOTE — ED Notes (Signed)
Patient refused to have his blood drawn. 

## 2015-07-14 NOTE — ED Provider Notes (Addendum)
CSN: [id number]     Arrival date & time 07/14/15  1441 History   First MD Initiated Contact with Patient 07/14/15 1523     Chief Complaint  Patient presents with  . Addiction Problem     (Consider location/radiation/quality/duration/timing/severity/associated sxs/prior Treatment) HPI Comments: Patient here complaining of body aches and slight depression without suicidal ideations due to cocaine use. His last use of cocaine was this morning and states that he snorts it. Denies any concurrent alcohol use. No tremors. No vomiting or diarrhea. Denies any auditory or visual hallucinations. Patient's mother called a local rehabilitation facility and told come here. Patient also has a Lexicographer who agrees that the patient has not displayed any worsening of his chronic psychiatric condition which schizophrenia. Patient states he's been noncompliant with his psychiatric medications.  The history is provided by the patient, a relative and a caregiver.    Past Medical History  Diagnosis Date  . Hypertension   . Depression   . Schizophrenia [facility])    Past Surgical History  Procedure Laterality Date  . Back surgery     Family History  Problem Relation Age of Onset  . Allergies Mother   . Asthma Mother    Social History  Substance Use Topics  . Smoking status: Current Every Day Smoker -- 1.00 packs/day for 8 years    Types: Cigarettes  . Smokeless tobacco: None  . Alcohol Use: No    Review of Systems  All other systems reviewed and are negative.     Allergies  Review of patient's allergies indicates no known allergies.  Home Medications   Prior to Admission medications   Medication Sig Start Date End Date Taking? Authorizing Provider  fluPHENAZine decanoate (PROLIXIN) 25 MG/ML injection Inject into the muscle every 14 (fourteen) days.  05/16/15  Yes Historical Provider, MD  divalproex (DEPAKOTE) 500 MG DR tablet Take 3 tablets (1,500 mg total) by mouth  at bedtime. Patient not taking: Reported on 07/14/2015 06/12/15   Shari Prows, MD  traZODone (DESYREL) 100 MG tablet Take 1 tablet (100 mg total) by mouth at bedtime as needed for sleep. Patient not taking: Reported on 07/14/2015 06/12/15   Shari Prows, MD   BP 105/63 mmHg  Temp(Src) 99 F (37.2 C)  Resp 18  SpO2 97% Physical Exam  Constitutional: He is oriented to person, place, and time. He appears well-developed and well-nourished.  Non-toxic appearance. No distress.  HENT:  Head: Normocephalic and atraumatic.  Eyes: Conjunctivae, EOM and lids are normal. Pupils are equal, round, and reactive to light.  Neck: Normal range of motion. Neck supple. No tracheal deviation present. No thyroid mass present.  Cardiovascular: Normal rate, regular rhythm and normal heart sounds.  Exam reveals no gallop.   No murmur heard. Pulmonary/Chest: Effort normal and breath sounds normal. No stridor. No respiratory distress. He has no decreased breath sounds. He has no wheezes. He has no rhonchi. He has no rales.  Abdominal: Soft. Normal appearance and bowel sounds are normal. He exhibits no distension. There is no tenderness. There is no rebound and no CVA tenderness.  Musculoskeletal: Normal range of motion. He exhibits no edema or tenderness.  Neurological: He is alert and oriented to person, place, and time. He has normal strength. No cranial nerve deficit or sensory deficit. GCS eye subscore is 4. GCS verbal subscore is 5. GCS motor subscore is 6.  Skin: Skin is warm and dry. No abrasion and no rash noted.  Psychiatric:  His affect is blunt. His speech is delayed.  Nursing note and vitals reviewed.   ED Course  Procedures (including critical care time) Labs Review Labs Reviewed  CBC WITH DIFFERENTIAL/PLATELET  COMPREHENSIVE METABOLIC PANEL  ETHANOL  URINE RAPID DRUG SCREEN, HOSP PERFORMED  CK    Imaging Review No results found. I have personally reviewed and evaluated these  images and lab results as part of my medical decision-making.   EKG Interpretation None      MDM   Final diagnoses:  None    Patient without acute psychiatric condition at this time. Will order medical screening labs which will be given to the patient to take with him to an outpatient rehabilitation facility  4:09 PM Patient now is requesting to leave and does not want medical clearance labs drawn. He is alert and oriented 4 and has the capacity to make this decision. He is not displaying any response to internal stimuli. Outpatient resources given  Lorre Nick, MD 07/14/15 1601  Lorre Nick, MD 07/14/15 [phone]

## 2015-07-14 NOTE — ED Notes (Signed)
Pt states he has been depressed and fatigued.  Pt uses 3-4 x per week.  Pt states on and off problem.  Cannot identify when it started.  Pt states he wants to focus on his fatigue and muscle weakness.  Pt states he lost control of his muscles.  Pt states he feels like he is a "parapalegic".  Pt is ambulatory.  No alcohol addiction or thoughts of hurting self or others.

## 2015-07-14 NOTE — Discharge Instructions (Signed)
Chemical Dependency Chemical dependency is an addiction to drugs or alcohol. It is characterized by the repeated behavior of seeking out and using drugs and alcohol despite harmful consequences to the health and safety of ones self and others.  RISK FACTORS There are certain situations or behaviors that increase a person's risk for chemical dependency. These include:  A family history of chemical dependency.  A history of mental health issues, including depression and anxiety.  A home environment where drugs and alcohol are easily available to you.  Drug or alcohol use at a young age. SYMPTOMS  The following symptoms can indicate chemical dependency:  Inability to limit the use of drugs or alcohol.  Nausea, sweating, shakiness, and anxiety that occurs when alcohol or drugs are not being used.  An increase in amount of drugs or alcohol that is necessary to get drunk or high. People who experience these symptoms can assess their use of drugs and alcohol by asking themselves the following questions:  Have you been told by friends or family that they are worried about your use of alcohol or drugs?  Do friends and family ever tell you about things you did while drinking alcohol or using drugs that you do not remember?  Do you lie about using alcohol or drugs or about the amounts you use?  Do you have difficulty completing daily tasks unless you use alcohol or drugs?  Is the level of your work or school performance lower because of your drug or alcohol use?  Do you get sick from using drugs or alcohol but keep using anyway?  Do you feel uncomfortable in social situations unless you use alcohol or drugs?  Do you use drugs or alcohol to help forget problems? An answer of yes to any of these questions may indicate chemical dependency. Professional evaluation is suggested.   This information is not intended to replace advice given to you by your health care provider. Make sure you  discuss any questions you have with your health care provider.   Document Released: 04/30/2001 Document Revised: 07/29/2011 Document Reviewed: 07/12/2010 Elsevier Interactive Patient Education 2016 [employer]. [employer] Guide Outpatient Counseling/Substance Abuse Adult The United Ways 211 is a great source of information about community services available.  Access by dialing 2-1-1 from anywhere in [place], or by website -  PooledIncome.pl.   Other Local Resources (Updated 05/2015)  Crisis Hotlines   Services     Area Served  [employer]  Crisis Hotline, available 24 hours a day, 7 days a week: 931-592-0998 [facility], [place]   [facility]  Crisis Hotline, available 24 hours a day, 7 days a week: 479-212-4692 [facility], [place]  [facility]  Suicide Prevention Hotline, available 24 hours a day, 7 days a week: 660 118 2097 [facility], [place]  [employer], available 24 hours a day, 7 days a week: 9016484709 [facility], [place]   [facility] Access to [employer], available 24 hours a day, 7 days a week: 218-235-5995 All   [facility]  Crisis Hotline, available 24 hours a day, 7 days a week: 918-691-9132 All   Other Local Resources (Updated 05/2015)  Outpatient Counseling/ Substance Abuse Programs  Services     Address and Phone Number  ADS (Alcohol and Drug Services)   Options include Individual counseling, group counseling, intensive outpatient program (several hours a day, several days a week)  Offers depression assessments  Provides methadone maintenance program [phone] [address] [place], [place] [address]   [facility]   Offers partial hospitalization/day treatment and DUI/DWI programs  [employer], private insurance [phone] [address], [address] [place], [place] [address]  [facility]    Services  include intensive outpatient program (several hours a day, several days a week), outpatient treatment, DUI/DWI services, family education  Also has some services specifically for [employer] transitional housing  [phone] [address] [place], [place] [address]     [facility]  [employer], private pay, and private insurance [phone] [address] [place], [place] [address]  [employer] [facility]  Services include individual counseling, substance abuse intensive outpatient program (several hours a day, several days a week), day treatment  Delene Loll, Medicaid, private insurance [phone] [address] [place], [place] [address]  [facility]   Offers substance abuse intensive outpatient program (several hours a day, several days a week), partial hospitalization program [phone] [address] [place], [place] [address]  [phone] [address] [place], [place] [address]  [phone] [address] [place], [place] [address]  [phone], [address] [place], [place] [address]  [facility]  Individual counseling only  Accepts private insurance only [phone] [address] [place], [place] [address]  [facility]  Methadone maintenance program [phone] [address] [place], [place] [address]  [facility]  Walk-In Clinic providing substance abuse and mental health counseling  Accepts Medicaid, Medicare, private insurance  Offers sliding scale for uninsured [phone] [address] [place], [place]   [facility], [employer].  Offers individual counseling, and intensive in-home services [phone] [address], [address] [place], [place] [address]  [facility] individual counseling, family counseling, group therapy, domestic violence  counseling, consumer credit counseling  Accepts Medicare, Medicaid, private insurance  Offers sliding scale for uninsured [phone] [address] [place], [place] [address]  [phone] [facility], [address] [place], [place] [address]  [facility]  Offers individual, family and group counseling  3 locations - [place], [place], and [place]  [phone]  [address] [place], [place] [address]  [address] [place], [place] [address]  [address] [place], [place] [address]  [facility]    Offers psychiatric assessment, 8-week Intensive Outpatient Program (several hours a day, several times a week, daytime or evenings), early recovery group, family Program, medication management  Private pay or private insurance only [phone], or  [phone] [address] [place], [place] [address]  [facility] [employer] individual, couples and family counseling  Accepts Medicaid, private insurance, and sliding scale for uninsured [phone] [address] [place], [place] [address]  Len Blalock, MD  Individual counseling  Private insurance [phone] [address] [place], [place] [address]  [facility]   Offers assessment, substance abuse treatment, and behavioral health treatment [phone] [address] [place], [place] [address]  [facility]  Individual counseling  Accepts private insurance [phone] [address] [place], [place] [address]  [facility]  Individual counseling  Delene Loll, private insurance [phone] [address] [place], [place] [address]  [facility]    Offers intensive outpatient program (several hours a day, several times a week)  Private pay, private insurance [phone] [facility] [place], [place]  [facility]  Individual counseling  Medicare, private insurance  [phone] [address] [place], [place] [address]  [facility]    Offers intensive outpatient program (several hours a day, several times a week) and partial hospitalization program [phone] [address] [place], [place] [address]  Emerson Monte, MD  Individual counseling [phone] [address] [place], [place] [address]  [facility]  Offers Christian counseling to individuals, couples, and families  Accepts Medicare and private insurance; offers sliding scale for uninsured [phone] [address] [place], [place] [address]  [facility]  [place] [facility] [phone] [address] [place], [place] [address]  [facility] crisis counseling, individual counseling, group therapy, in-home therapy, domestic violence services, day treatment, DWI services, Administrator, arts (CST), Doctor, hospital (ACTT), substance abuse Intensive Outpatient Program (several hours a day, several times a week)  2 locations - [place] and [place] [phone] [address] [place], [place] [address]  [phone] [address] [place], [place] [address]  [facility]     Individual counseling and group therapy  [employer], [place], [place] [phone] [address] [place], [place]  [facility]  Offers individual and family counseling  Offers LGBTQ services  Accepts private insurance and private pay [phone] [address] [place], [place] [address]  [facility]    Offers individual counseling, group therapy, and outpatient detox  Accepts private insurance [phone] [address] [place], [place]  [facility]  Individual counseling  Accepts Medicare, private insurance [phone] [address] [place], [place] [address]  [employer]  Individual counseling  Accepts Medicare, private insurance [phone] [address] [place], [place] [address]  [facility]   Offers substance abuse Intensive Outpatient Program (several hours a day, several times a week) [phone], or [phone] [place], [place]

## 2015-08-03 ENCOUNTER — Telehealth: Payer: Self-pay | Admitting: *Deleted

## 2015-08-03 NOTE — ED Notes (Signed)
Contacted by [employer] requesting medical diagnosis, referred to medical records department.

## 2015-09-11 ENCOUNTER — Ambulatory Visit (HOSPITAL_COMMUNITY): Admission: RE | Admit: 2015-09-11 | Payer: Medicare Other | Source: Home / Self Care | Admitting: Psychiatry

## 2015-09-26 DIAGNOSIS — M791 Myalgia: Secondary | ICD-10-CM | POA: Diagnosis not present

## 2015-09-26 DIAGNOSIS — R509 Fever, unspecified: Secondary | ICD-10-CM | POA: Diagnosis not present

## 2015-09-26 DIAGNOSIS — F39 Unspecified mood [affective] disorder: Secondary | ICD-10-CM | POA: Diagnosis not present

## 2015-09-29 DIAGNOSIS — M2550 Pain in unspecified joint: Secondary | ICD-10-CM | POA: Diagnosis not present

## 2015-09-29 DIAGNOSIS — M791 Myalgia: Secondary | ICD-10-CM | POA: Diagnosis not present

## 2015-11-04 DIAGNOSIS — F258 Other schizoaffective disorders: Secondary | ICD-10-CM | POA: Diagnosis not present

## 2015-11-05 DIAGNOSIS — F258 Other schizoaffective disorders: Secondary | ICD-10-CM | POA: Diagnosis not present

## 2015-11-07 DIAGNOSIS — F258 Other schizoaffective disorders: Secondary | ICD-10-CM | POA: Diagnosis not present

## 2015-11-08 DIAGNOSIS — F258 Other schizoaffective disorders: Secondary | ICD-10-CM | POA: Diagnosis not present

## 2015-11-09 DIAGNOSIS — F250 Schizoaffective disorder, bipolar type: Secondary | ICD-10-CM | POA: Diagnosis not present

## 2015-12-27 ENCOUNTER — Observation Stay (HOSPITAL_COMMUNITY)
Admission: AD | Admit: 2015-12-27 | Discharge: 2015-12-28 | Disposition: A | Discharge status: Home / Self Care | Payer: Medicare Other | Source: Intra-hospital | Attending: Psychiatry | Admitting: Psychiatry

## 2015-12-27 ENCOUNTER — Encounter (HOSPITAL_COMMUNITY): Payer: Self-pay | Admitting: *Deleted

## 2015-12-27 ENCOUNTER — Emergency Department (HOSPITAL_COMMUNITY)
Admission: EM | Admit: 2015-12-27 | Discharge: 2015-12-27 | Disposition: A | Discharge status: Other Acute Inpatient Hospital | Payer: Medicare Other | Attending: Emergency Medicine | Admitting: Emergency Medicine

## 2015-12-27 DIAGNOSIS — R45851 Suicidal ideations: Secondary | ICD-10-CM

## 2015-12-27 DIAGNOSIS — F1420 Cocaine dependence, uncomplicated: Secondary | ICD-10-CM | POA: Diagnosis present

## 2015-12-27 DIAGNOSIS — I10 Essential (primary) hypertension: Secondary | ICD-10-CM | POA: Insufficient documentation

## 2015-12-27 DIAGNOSIS — F17210 Nicotine dependence, cigarettes, uncomplicated: Secondary | ICD-10-CM | POA: Insufficient documentation

## 2015-12-27 DIAGNOSIS — Z79899 Other long term (current) drug therapy: Secondary | ICD-10-CM | POA: Insufficient documentation

## 2015-12-27 DIAGNOSIS — F250 Schizoaffective disorder, bipolar type: Principal | ICD-10-CM | POA: Insufficient documentation

## 2015-12-27 DIAGNOSIS — F259 Schizoaffective disorder, unspecified: Secondary | ICD-10-CM | POA: Diagnosis present

## 2015-12-27 HISTORY — DX: Schizoaffective disorder, unspecified: F25.9

## 2015-12-27 LAB — RAPID URINE DRUG SCREEN, HOSP PERFORMED
AMPHETAMINES: NOT DETECTED
BARBITURATES: NOT DETECTED
Benzodiazepines: NOT DETECTED
Cocaine: POSITIVE — AB
Opiates: NOT DETECTED
TETRAHYDROCANNABINOL: POSITIVE — AB

## 2015-12-27 LAB — CBC
HEMATOCRIT: 46.7 % (ref 39.0–52.0)
HEMOGLOBIN: 17.3 g/dL — AB (ref 13.0–17.0)
MCH: 30.8 pg (ref 26.0–34.0)
MCHC: 37 g/dL — ABNORMAL HIGH (ref 30.0–36.0)
MCV: 83.2 fL (ref 78.0–100.0)
Platelets: 307 10*3/uL (ref 150–400)
RBC: 5.61 MIL/uL (ref 4.22–5.81)
RDW: 12 % (ref 11.5–15.5)
WBC: 11.1 10*3/uL — ABNORMAL HIGH (ref 4.0–10.5)

## 2015-12-27 LAB — COMPREHENSIVE METABOLIC PANEL
ALT: 50 U/L (ref 17–63)
AST: 27 U/L (ref 15–41)
Albumin: 4.4 g/dL (ref 3.5–5.0)
Alkaline Phosphatase: 72 U/L (ref 38–126)
Anion gap: 10 (ref 5–15)
BUN: 11 mg/dL (ref 6–20)
CHLORIDE: 105 mmol/L (ref 101–111)
CO2: 22 mmol/L (ref 22–32)
CREATININE: 1 mg/dL (ref 0.61–1.24)
Calcium: 9.8 mg/dL (ref 8.9–10.3)
GFR calc Af Amer: >60 mL/min (ref >60)
Glucose, Bld: 176 mg/dL — ABNORMAL HIGH (ref 65–99)
Potassium: 3.8 mmol/L (ref 3.5–5.1)
Sodium: 137 mmol/L (ref 135–145)
Total Bilirubin: 0.7 mg/dL (ref 0.3–1.2)
Total Protein: 8 g/dL (ref 6.5–8.1)

## 2015-12-27 LAB — SALICYLATE LEVEL: Salicylate Lvl: <4 mg/dL (ref 2.8–30.0)

## 2015-12-27 LAB — ETHANOL

## 2015-12-27 LAB — ACETAMINOPHEN LEVEL: Acetaminophen (Tylenol), Serum: <10 ug/mL — ABNORMAL LOW (ref 10–30)

## 2015-12-27 MED ORDER — ALUM & MAG HYDROXIDE-SIMETH 200-200-20 MG/5ML PO SUSP: 30.0000 mL | ORAL | Status: DC | PRN

## 2015-12-27 MED ORDER — HYDROXYZINE HCL 25 MG PO TABS: 25.0000 mg | ORAL_TABLET | Freq: Four times a day (QID) | ORAL | Status: DC | PRN

## 2015-12-27 MED ORDER — ONDANSETRON HCL 4 MG PO TABS: 4.0000 mg | ORAL_TABLET | Freq: Three times a day (TID) | ORAL | Status: DC | PRN

## 2015-12-27 MED ORDER — IBUPROFEN 200 MG PO TABS: 600.0000 mg | ORAL_TABLET | Freq: Three times a day (TID) | ORAL | Status: DC | PRN

## 2015-12-27 MED ORDER — ACETAMINOPHEN 325 MG PO TABS: 650.0000 mg | ORAL_TABLET | ORAL | Status: DC | PRN

## 2015-12-27 MED ORDER — ACETAMINOPHEN 325 MG PO TABS: 650.0000 mg | ORAL_TABLET | Freq: Four times a day (QID) | ORAL | Status: DC | PRN

## 2015-12-27 MED ORDER — QUETIAPINE FUMARATE 25 MG PO TABS
25.0000 mg | ORAL_TABLET | Freq: Every evening | ORAL | Status: DC | PRN
Start: 2015-12-27 — End: 2015-12-28
  Administered 2015-12-27: 25 mg via ORAL
  Filled 2015-12-27: qty 1

## 2015-12-27 MED ORDER — ARIPIPRAZOLE 5 MG PO TABS
5.0000 mg | ORAL_TABLET | Freq: Every day | ORAL | Status: DC
  Administered 2015-12-28: 5 mg via ORAL
  Filled 2015-12-27: qty 1

## 2015-12-27 MED ORDER — ZOLPIDEM TARTRATE 5 MG PO TABS: 5.0000 mg | ORAL_TABLET | Freq: Every evening | ORAL | Status: DC | PRN

## 2015-12-27 MED ORDER — MAGNESIUM HYDROXIDE 400 MG/5ML PO SUSP: 30.0000 mL | Freq: Every day | ORAL | Status: DC | PRN

## 2015-12-27 MED ORDER — DIVALPROEX SODIUM 250 MG PO DR TAB
250.0000 mg | DELAYED_RELEASE_TABLET | Freq: Every day | ORAL | Status: DC
  Administered 2015-12-27: 250 mg via ORAL
  Filled 2015-12-27: qty 1

## 2015-12-27 NOTE — Progress Notes (Signed)
Patient ID: [name], male   DOB: 07/20/1985, 30 y.o.   MRN: [id number] Per State regulations 482.30 this chart was reviewed for medical necessity with respect to the patient's admission/duration of stay.    Next review date: 12/29/15  Thurman CoyerEric Jaielle Dlouhy, BSN, RN-BC  Case Manager

## 2015-12-27 NOTE — ED Triage Notes (Addendum)
Patient presents from home with c/o SI.  He has no specific plan, but states he has no specific plan to kill himself, but would just rather not be alive anymore.  Patient states he has felt this way for "a few months."  Patient denies hx of suicide attempts.  Patient's psych meds are managed by [facility] in [place].  He called them prior to coming to ED and was directed here.  The number for [facility] is [phone].  Patient was recently released from [facility] in [place] where he was being treated for cocaine abuse.  His last use was last week and he was released from [facility] this past weekend.  Patient denies use of EtOH and opioids, heroin or meth.  Patient's mother mentioned the cocaine use in triage and patient became annoyed.

## 2015-12-27 NOTE — ED Notes (Signed)
Sitter at bedside.

## 2015-12-27 NOTE — ED Notes (Signed)
Pt. is A & O x 3. Pt. Is calm and cooperative to questions.  Resting comfortably in bed, meal given. Pt. denies pain and AVH. Pt. Is positive for SI but have no plan or intent, feels hopeless, no Hi and no pain. Pt. reported use of cocaine a week ago. Denies alcohol use. Safety maintanied with Q 15 checks.

## 2015-12-27 NOTE — ED Notes (Signed)
[facility] with Dr. Lucianne MussKumar. Report given to [place], [place]. Pending [facility] transport.

## 2015-12-27 NOTE — ED Provider Notes (Signed)
Dr. Lucianne MussKumar [facility]   Pricilla LovelessScott Effrey Davidow, MD 12/27/15 2124

## 2015-12-27 NOTE — BH Assessment (Signed)
Assessment Note  [name] is an 30 y.o. male diagnosed with Schizophrenia and Depression. [facility] mother. He asked that she remain present during the TTS assessment. Patient sts, "I am here because I have suicidal thoughts". No triggers or stressors. No plan. No intent. Onset of suicidal thoughts started several months ago. No history of suicide attempts. He reports several depressive symptoms including loss of interest in usual pleasures, hopelessness, fatigue, and despondence. Denies HI. No legal issues. Currently cooperative. No AVH's.He reports using cocaine 1-2x's per week. Last use was 2 days ago. [facility], [facility], and [facility] in the past. He was discharged from [facility] this past weekend. Patient has an Investment banker, operationalACTT team Producer, television/film/video([facility]). His ACTT team worker is Judeth CornfieldStephanie [phone].   Diagnosis: Schizophrenia; Major Depressive Disorder, Recurrent, Severe; Substance Use Disorder Past Medical History:  Past Medical History:  Diagnosis Date  . Depression   . Hypertension   . Schizoaffective disorder (HCC)   . Schizophrenia [facility](HCC)     Past Surgical History:  Procedure Laterality Date  . BACK SURGERY      Family History:  Family History  Problem Relation Age of Onset  . Allergies Mother   . Asthma Mother     Social History:  reports that he has been smoking Cigarettes.  He has a 8.00 pack-year smoking history. He does not have any smokeless tobacco history on file. He reports that he does not drink alcohol or use drugs.  Additional Social History:  Alcohol / Drug Use Pain Medications: SEE MAR Prescriptions: SEE MAR Over the Counter: SEE MAR History of alcohol / drug use?: Yes Substance #1 Name of Substance 1: Cocaine 1 - Age of First Use: 30 yrs old  1 - Amount (size/oz): varies 1 - Frequency: 1-2 times per week  1 - Duration: on-going  1 - Last Use / Amount: 2 days  ago  CIWA: CIWA-Ar BP: 136/80 Pulse Rate: 104 COWS:    Allergies: No Known Allergies  Home Medications:  (Not in a hospital admission)  OB/GYN Status:  No LMP for male patient.  General Assessment Data [facility]: [facility] TTS Assessment: In system Is this a Tele or Face-to-Face Assessment?: Face-to-Face Is this an Initial Assessment or a Re-assessment for this encounter?: Initial Assessment Marital status: Single Maiden name:  (n/a) Is patient pregnant?: No Pregnancy Status: No Living Arrangements: Other (Comment) (with roommate ) Can pt return to current living arrangement?: Yes Admission Status: Voluntary Is patient capable of signing voluntary admission?: Yes Referral Source: Self/Family/Friend Insurance type:  (MCR/MCD)     Crisis Care Plan Living Arrangements: Other (Comment) (with roommate ) Legal Guardian: Other: (No legal guardian ) Name of Psychiatrist:  Producer, television/film/video([facility] ) Name of Therapist:  Producer, television/film/video([facility] )  Education Status Is patient currently in school?: No Current Grade:  (n/a) Highest grade of school patient has completed:  (some college ) Name of school:  (n/a) Contact person:  (n/a)  Risk to self with the past 6 months Suicidal Ideation: Yes-Currently Present Has patient been a risk to self within the past 6 months prior to admission? : Yes Suicidal Intent: Yes-Currently Present Has patient had any suicidal intent within the past 6 months prior to admission? : Yes Is patient at risk for suicide?: Yes Suicidal Plan?: No Has patient had any suicidal plan within the past 6 months prior to admission? : No Specify Current Suicidal  Plan:  (no plan ) Access to Means: No What has been your use of drugs/alcohol within the last 12 months?:  (cocaine ) Previous Attempts/Gestures: No How many times?:  (0) Other Self Harm Risks:  (n/a) Triggers for Past Attempts: Other (Comment) (no previous suicide attempts or gestures) Intentional  Self Injurious Behavior: None Family Suicide History: Unknown Recent stressful life event(s):  (patient does not indentify any stressors) Persecutory voices/beliefs?: No Depression: Yes Depression Symptoms: Feeling angry/irritable, Feeling worthless/self pity, Guilt, Loss of interest in usual pleasures, Fatigue, Isolating, Despondent, Insomnia, Tearfulness Substance abuse history and/or treatment for substance abuse?: No Suicide prevention information given to non-admitted patients: Yes  Risk to Others within the past 6 months Homicidal Ideation: No Does patient have any lifetime risk of violence toward others beyond the six months prior to admission? : No Thoughts of Harm to Others: No Current Homicidal Intent: No Current Homicidal Plan: No Access to Homicidal Means: No Identified Victim:  (n/a) History of harm to others?: No Assessment of Violence: None Noted Violent Behavior Description:  (patient is calm and cooperative ) Does patient have access to weapons?: No Criminal Charges Pending?: No Does patient have a court date: No Is patient on probation?: No  Psychosis Hallucinations: None noted Delusions: None noted  Mental Status Report Appearance/Hygiene: Disheveled, Body odor, In scrubs Eye Contact: Fair Motor Activity: Freedom of movement Speech: Logical/coherent Level of Consciousness: Alert Mood: Depressed Affect: Appropriate to circumstance, Irritable, Sad Anxiety Level: Minimal Thought Processes: Relevant Judgement: Impaired Orientation: Person, Place, Time, Situation Obsessive Compulsive Thoughts/Behaviors: None  Cognitive Functioning Concentration: Normal Memory: Remote Intact, Recent Intact IQ: Average Insight: Poor Impulse Control: Poor Appetite: Good Weight Loss:  (none reported) Weight Gain:  (none reported) Sleep: Decreased Total Hours of Sleep:  (varies ) Vegetative Symptoms: None  ADLScreening [facility] Assessment Services) Patient's cognitive  ability adequate to safely complete daily activities?: Yes Patient able to express need for assistance with ADLs?: Yes Independently performs ADLs?: Yes (appropriate for developmental age)  Prior Inpatient Therapy Prior Inpatient Therapy: Yes Prior Therapy Dates:  (discharged from [facility] this past weekend) Prior Therapy Facilty/Provider(s):  (OV, HH, [facility], [facility]) Reason for Treatment:  (substance abuse, depression, anxiety )  Prior Outpatient Therapy Prior Outpatient Therapy: Yes Prior Therapy Dates:  (current) Prior Therapy Facilty/Provider(s):  Producer, television/film/video ) Reason for Treatment:  (ACT team ) Does patient have an ACCT team?: Yes Does patient have Intensive In-House Services?  : No Does patient have [facility] services? : No Does patient have P4CC services?: No  ADL Screening (condition at time of admission) Patient's cognitive ability adequate to safely complete daily activities?: Yes Is the patient deaf or have difficulty hearing?: No Does the patient have difficulty seeing, even when wearing glasses/contacts?: No Does the patient have difficulty concentrating, remembering, or making decisions?: No Patient able to express need for assistance with ADLs?: Yes Does the patient have difficulty dressing or bathing?: No Independently performs ADLs?: Yes (appropriate for developmental age) Does the patient have difficulty walking or climbing stairs?: No Weakness of Legs: None Weakness of Arms/Hands: None  Home Assistive Devices/Equipment Home Assistive Devices/Equipment: None    Abuse/Neglect Assessment (Assessment to be complete while patient is alone) Physical Abuse: Denies Verbal Abuse: Denies Sexual Abuse: Denies Exploitation of patient/patient's resources: Denies Self-Neglect: Denies Values / Beliefs Cultural Requests During Hospitalization: None Spiritual Requests During Hospitalization: None   Advance Directives (For Healthcare) Does patient have an advance  directive?: No Would patient like information on creating an advanced  directive?: No - patient declined information Nutrition Screen- [facility] Adult/[facility] Patient's home diet: Regular  Additional Information 1:1 In Past 12 Months?: No CIRT Risk: No Elopement Risk: No Does patient have medical clearance?: No     Disposition:  Disposition Initial Assessment Completed for this Encounter: Yes Disposition of Patient:  (Patient meets criteria for OBS per Nanine Means, DNP)  On Site Evaluation by:   Reviewed with Physician:    Melynda Ripple [facility] 12/27/2015 7:26 PM

## 2015-12-27 NOTE — ED Provider Notes (Signed)
[facility]-EMERGENCY DEPT Provider Note   CSN: [id number] Arrival date & time: 12/27/15  1627  First Provider Contact:  None       History   Chief Complaint Chief Complaint  Patient presents with  . Suicidal    HPI [name] is a 30 y.o. male.  HPI [name] is a 30 y.o. male with history of polysubstance abuse, schizophrenia, presents to emergency department with his mother with complaint of suicidal thoughts. Patient recently treated at [facility] in [place] for cocaine abuse. Patient states that he has not used since then. He denies any drugs or alcohol at this time. He states for the last several months he has had thoughts of suicide. He denies any particular plane, he just states that he would rather "not be alive." Patient's mother brought patient here for help. Patient does see a therapist and a psychiatrist, and he called them prior to coming here. When I asked patient if he wanted to get help, he stated "I would rather have an easy way out but I'm willing to stay and get help." Pt denies hallucinations, states eating and sleeping well. He denies prior treatment for suicidal ideations.  Past Medical History:  Diagnosis Date  . Depression   . Hypertension   . Schizoaffective disorder (HCC)   . Schizophrenia [facility])     Patient Active Problem List   Diagnosis Date Noted  . Schizophrenia, paranoid, chronic (HCC) 06/08/2015  . Schizoaffective disorder, bipolar type (HCC) 10/21/2014  . Tobacco use disorder 10/21/2014  . Cocaine use disorder, moderate, dependence (HCC) 10/21/2014  . Cannabis use disorder, moderate, dependence (HCC) 10/21/2014    Past Surgical History:  Procedure Laterality Date  . BACK SURGERY         Home Medications    Prior to Admission medications   Medication Sig Start Date End Date Taking? Authorizing Provider  fluPHENAZine decanoate (PROLIXIN) 25 MG/ML injection Inject into the muscle every 14 (fourteen) days.  05/16/15  Yes  Historical Provider, MD  divalproex (DEPAKOTE) 500 MG DR tablet Take 3 tablets (1,500 mg total) by mouth at bedtime. Patient not taking: Reported on 07/14/2015 06/12/15   Shari Prows, MD  traZODone (DESYREL) 100 MG tablet Take 1 tablet (100 mg total) by mouth at bedtime as needed for sleep. Patient not taking: Reported on 07/14/2015 06/12/15   Shari Prows, MD    Family History Family History  Problem Relation Age of Onset  . Allergies Mother   . Asthma Mother     Social History Social History  Substance Use Topics  . Smoking status: Current Every Day Smoker    Packs/day: 1.00    Years: 8.00    Types: Cigarettes  . Smokeless tobacco: Not on file  . Alcohol use No     Allergies   Review of patient's allergies indicates no known allergies.   Review of Systems Review of Systems  Constitutional: Negative for chills and fever.  Respiratory: Negative for cough, chest tightness and shortness of breath.   Cardiovascular: Negative for chest pain, palpitations and leg swelling.  Gastrointestinal: Negative for abdominal distention, abdominal pain, diarrhea, nausea and vomiting.  Genitourinary: Negative for dysuria, frequency, hematuria and urgency.  Musculoskeletal: Negative for arthralgias, myalgias, neck pain and neck stiffness.  Skin: Negative for rash.  Allergic/Immunologic: Negative for immunocompromised state.  Neurological: Negative for dizziness, weakness, light-headedness, numbness and headaches.  Psychiatric/Behavioral: Positive for dysphoric mood and suicidal ideas. Negative for hallucinations and sleep disturbance.  Physical Exam Updated Vital Signs BP 136/80 (BP Location: Left Arm)   Pulse 104   Temp 98.5 F (36.9 C) (Oral)   Resp 17   SpO2 96%   Physical Exam  Constitutional: He appears well-developed and well-nourished. No distress.  HENT:  Head: Normocephalic and atraumatic.  Eyes: Conjunctivae are normal.  Neck: Neck supple.    Cardiovascular: Normal rate, regular rhythm and normal heart sounds.   Pulmonary/Chest: Effort normal. No respiratory distress. He has no wheezes. He has no rales.  Abdominal: Soft. Bowel sounds are normal. He exhibits no distension. There is no tenderness. There is no rebound.  Musculoskeletal: He exhibits no edema.  Neurological: He is alert.  Skin: Skin is warm and dry.  Psychiatric: He has a normal mood and affect.  Flat affect  Nursing note and vitals reviewed.    ED Treatments / Results  Labs (all labs ordered are listed, but only abnormal results are displayed) Labs Reviewed  COMPREHENSIVE METABOLIC PANEL - Abnormal; Notable for the following:       Result Value   Glucose, Bld 176 (*)    All other components within normal limits  ACETAMINOPHEN LEVEL - Abnormal; Notable for the following:    Acetaminophen (Tylenol), Serum <10 (*)    All other components within normal limits  CBC - Abnormal; Notable for the following:    WBC 11.1 (*)    Hemoglobin 17.3 (*)    MCHC 37.0 (*)    All other components within normal limits  URINE RAPID DRUG SCREEN, HOSP PERFORMED - Abnormal; Notable for the following:    Cocaine POSITIVE (*)    Tetrahydrocannabinol POSITIVE (*)    All other components within normal limits  ETHANOL  SALICYLATE LEVEL    EKG  EKG Interpretation None       Radiology No results found.  Procedures Procedures (including critical care time)  Medications Ordered in ED Medications  ondansetron (ZOFRAN) tablet 4 mg (not administered)  acetaminophen (TYLENOL) tablet 650 mg (not administered)  ibuprofen (ADVIL,MOTRIN) tablet 600 mg (not administered)  zolpidem (AMBIEN) tablet 5 mg (not administered)  alum & mag hydroxide-simeth (MAALOX/MYLANTA) 200-200-20 MG/5ML suspension 30 mL (not administered)     Initial Impression / Assessment and Plan / ED Course  I have reviewed the triage vital signs and the nursing notes.  Pertinent labs & imaging results  that were available during my care of the patient were reviewed by me and considered in my medical decision making (see chart for details).  Clinical Course  Comment By Time  Patient emergency department with suicidal thoughts. He is calm and cooperative at this time. He is willing to stay, TTS consult ordered. Jaynie Crumbleatyana Shada Nienaber, PA-C 08/09 1805   Pt is medically cleared pending TTS asseessment.   Vitals:   12/27/15 1650 12/27/15 1932 12/27/15 2056  BP: 136/80 133/83 143/79  Pulse: 104 90 95  Resp: 17 18 20   Temp: 98.5 F (36.9 C) 98.6 F (37 C) 98.2 F (36.8 C)  TempSrc: Oral Oral Oral  SpO2: 96% 95% 98%     Final Clinical Impressions(s) / ED Diagnoses   Final diagnoses:  Suicidal ideation    New Prescriptions Discharge Medication List as of 12/27/2015 10:04 PM       Jaynie Crumbleatyana Rigel Filsinger, PA-C 12/28/15 0100    Pricilla LovelessScott Goldston, MD 12/28/15 0200

## 2015-12-28 ENCOUNTER — Encounter (HOSPITAL_COMMUNITY): Payer: Self-pay | Admitting: Emergency Medicine

## 2015-12-28 ENCOUNTER — Encounter (HOSPITAL_COMMUNITY): Payer: Self-pay | Admitting: *Deleted

## 2015-12-28 ENCOUNTER — Emergency Department (HOSPITAL_COMMUNITY)
Admission: EM | Admit: 2015-12-28 | Discharge: 2015-12-29 | Disposition: A | Discharge status: Home / Self Care | Payer: Medicare Other | Attending: Emergency Medicine | Admitting: Emergency Medicine

## 2015-12-28 DIAGNOSIS — M25562 Pain in left knee: Secondary | ICD-10-CM | POA: Diagnosis not present

## 2015-12-28 DIAGNOSIS — I10 Essential (primary) hypertension: Secondary | ICD-10-CM | POA: Insufficient documentation

## 2015-12-28 DIAGNOSIS — F1420 Cocaine dependence, uncomplicated: Secondary | ICD-10-CM

## 2015-12-28 DIAGNOSIS — M25512 Pain in left shoulder: Secondary | ICD-10-CM | POA: Diagnosis not present

## 2015-12-28 DIAGNOSIS — F250 Schizoaffective disorder, bipolar type: Secondary | ICD-10-CM | POA: Diagnosis not present

## 2015-12-28 DIAGNOSIS — Z79899 Other long term (current) drug therapy: Secondary | ICD-10-CM | POA: Diagnosis not present

## 2015-12-28 DIAGNOSIS — F17210 Nicotine dependence, cigarettes, uncomplicated: Secondary | ICD-10-CM | POA: Insufficient documentation

## 2015-12-28 DIAGNOSIS — M25569 Pain in unspecified knee: Secondary | ICD-10-CM | POA: Diagnosis not present

## 2015-12-28 DIAGNOSIS — T148 Other injury of unspecified body region: Secondary | ICD-10-CM | POA: Diagnosis not present

## 2015-12-28 MED ORDER — TRAZODONE HCL 100 MG PO TABS: 100.0000 mg | ORAL_TABLET | Freq: Every evening | ORAL | 0 refills | Status: DC | PRN

## 2015-12-28 MED ORDER — DIVALPROEX SODIUM 250 MG PO DR TAB: 500.0000 mg | DELAYED_RELEASE_TABLET | Freq: Every day | ORAL | 0 refills | Status: DC

## 2015-12-28 NOTE — Discharge Instructions (Signed)
Follow up with ACTT team.

## 2015-12-28 NOTE — Progress Notes (Signed)
Patient ID: [name], male   DOB: 07/23/1985, 30 y.o.   MRN: [id number] Patient discharged per MD orders. Patient given education regarding follow-up appointments and medications. Patient denies any questions or concerns about these instructions. Patient was escorted to locker and given belongings before discharge to hospital lobby. Patient currently denies SI/HI and auditory and visual hallucinations on discharge. Patient picked up by his ACCT team.

## 2015-12-28 NOTE — H&P (Signed)
[facility] Observation Unit Provider Admission PAA/H&P  Patient Identification: [name] MRN:  [id number] Date of Evaluation:  12/28/2015 Chief Complaint:  Depression with passive SI Principal Diagnosis: Schizoaffective Disorder, Substance Induced Mood Disorder Diagnosis:   Patient Active Problem List   Diagnosis Date Noted  . Schizoaffective disorder ([place]) [F25.9] 12/27/2015  . Schizophrenia, paranoid, chronic ([place]) [F20.0] 06/08/2015  . Schizoaffective disorder, bipolar type ([place]) [F25.0] 10/21/2014  . Tobacco use disorder [F17.200] 10/21/2014  . Cocaine use disorder, moderate, dependence ([place]) [F14.20] 10/21/2014  . Cannabis use disorder, moderate, dependence ([place]) [F12.20] 10/21/2014   History of Present Illness [name] is admitted to the [facility] Observation Unit earlier today due to depression symptoms with passive SI. [name] is endorsing such depressive symptoms over the past few months, which include anhedonia, despair, hopelessness and fatigue. He has had passive Si, but denies intent, plan or means. He is denying exacerbation of his depression due to external triggers. He noted compliance with his psychotropic medications, but continues to use Cocaine 1 - 2 times weekly. As noted above he was recently discharged from [place] due to substance abuse. He is denying AVH, paranoia, delusions or thought insertion. He is denying hypo-mania, sleep disturbance , anxiety symptoms or panic attacks at this time. Please see TTS evaluation completed earlier for addition subjective information. [name] is an 30 y.o. male diagnosed with Schizophrenia and Depression. He presents to [facility] voluntarily brought his mother. He asked that she remain present during the TTS assessment. Patient sts, "I am here because I have suicidal thoughts". No triggers or stressors. No plan. No intent. Onset of suicidal thoughts started several months ago. No history of suicide attempts. He reports several  depressive symptoms including loss of interest in usual pleasures, hopelessness, fatigue, and despondence. Denies HI. No legal issues. Currently cooperative. No AVH's.He reports using cocaine 1-2x's per week. Last use was 2 days ago. Patient has received inpatient treatment at [facility], [facility], [facility], and [facility] in the past. He was discharged from [facility] this past weekend. Patient has an Agricultural consultant Licensed conveyancer). His ACTT team worker is Colletta [place] [phone].   Associated Signs/Symptoms: Depression Symptoms:  depressed mood, anhedonia, fatigue, feelings of worthlessness/guilt, suicidal thoughts without plan, (Hypo) Manic Symptoms:  n/a Anxiety Symptoms:  n/a Psychotic Symptoms:  n/a PTSD Symptoms: Negative Total Time spent with patient: 30 minutes  Past Psychiatric History: see HPI  Is the patient at risk to self? No.  Has the patient been a risk to self in the past 6 months? No.  Has the patient been a risk to self within the distant past? No.  Is the patient a risk to others? No.  Has the patient been a risk to others in the past 6 months? No.  Has the patient been a risk to others within the distant past? No.   Prior Inpatient Therapy:  yes Prior Outpatient Therapy:  yes  Alcohol Screening:   Substance Abuse History in the last 12 months:  Yes.   Consequences of Substance Abuse: Family Consequences:  estranged relationships Previous Psychotropic Medications: Yes  Psychological Evaluations: Yes  Past Medical History:  Past Medical History:  Diagnosis Date  . Depression   . Hypertension   . Schizoaffective disorder ([place])   . Schizophrenia [facility])     Past Surgical History:  Procedure Laterality Date  . BACK SURGERY     Family History:  Family History  Problem Relation Age of Onset  . Allergies Mother   .  Asthma Mother    Tobacco Screening:   Social History:  History  Alcohol Use No     History  Drug Use No    Comment: Previous drug use hx     Additional Social History:                           Allergies:  No Known Allergies Lab Results:  Results for orders placed or performed during the hospital encounter of 12/27/15 (from the past 48 hour(s))  Rapid urine drug screen (hospital performed)     Status: Abnormal   Collection Time: 12/27/15  5:00 PM  Result Value Ref Range   Opiates NONE DETECTED NONE DETECTED   Cocaine POSITIVE (A) NONE DETECTED   Benzodiazepines NONE DETECTED NONE DETECTED   Amphetamines NONE DETECTED NONE DETECTED   Tetrahydrocannabinol POSITIVE (A) NONE DETECTED   Barbiturates NONE DETECTED NONE DETECTED    Comment:        DRUG SCREEN FOR MEDICAL PURPOSES ONLY.  IF CONFIRMATION IS NEEDED FOR ANY PURPOSE, NOTIFY LAB WITHIN 5 DAYS.        LOWEST DETECTABLE LIMITS FOR URINE DRUG SCREEN Drug Class       Cutoff (ng/mL) Amphetamine      1000 Barbiturate      200 Benzodiazepine   161 Tricyclics       096 Opiates          300 Cocaine          300 THC              50   Comprehensive metabolic panel     Status: Abnormal   Collection Time: 12/27/15  5:25 PM  Result Value Ref Range   Sodium 137 135 - 145 mmol/L   Potassium 3.8 3.5 - 5.1 mmol/L   Chloride 105 101 - 111 mmol/L   CO2 22 22 - 32 mmol/L   Glucose, Bld 176 (H) 65 - 99 mg/dL   BUN 11 6 - 20 mg/dL   Creatinine, Ser 1.00 0.61 - 1.24 mg/dL   Calcium 9.8 8.9 - 10.3 mg/dL   Total Protein 8.0 6.5 - 8.1 g/dL   Albumin 4.4 3.5 - 5.0 g/dL   AST 27 15 - 41 U/L   ALT 50 17 - 63 U/L   Alkaline Phosphatase 72 38 - 126 U/L   Total Bilirubin 0.7 0.3 - 1.2 mg/dL   GFR calc non Af Amer >60 >60 mL/min   GFR calc Af Amer >60 >60 mL/min    Comment: (NOTE) The eGFR has been calculated using the CKD EPI equation. This calculation has not been validated in all clinical situations. eGFR's persistently <60 mL/min signify possible Chronic Kidney Disease.    Anion gap 10 5 - 15  Ethanol     Status: None   Collection Time: 12/27/15  5:25 PM   Result Value Ref Range   Alcohol, Ethyl (B) <5 <5 mg/dL    Comment:        LOWEST DETECTABLE LIMIT FOR SERUM ALCOHOL IS 5 mg/dL FOR MEDICAL PURPOSES ONLY   Salicylate level     Status: None   Collection Time: 12/27/15  5:25 PM  Result Value Ref Range   Salicylate Lvl <0.4 2.8 - 30.0 mg/dL  Acetaminophen level     Status: Abnormal   Collection Time: 12/27/15  5:25 PM  Result Value Ref Range   Acetaminophen (Tylenol), Serum <10 (L) 10 - 30 ug/mL  Comment:        THERAPEUTIC CONCENTRATIONS VARY SIGNIFICANTLY. A RANGE OF 10-30 ug/mL MAY BE AN EFFECTIVE CONCENTRATION FOR MANY PATIENTS. HOWEVER, SOME ARE BEST TREATED AT CONCENTRATIONS OUTSIDE THIS RANGE. ACETAMINOPHEN CONCENTRATIONS >150 ug/mL AT 4 HOURS AFTER INGESTION AND >50 ug/mL AT 12 HOURS AFTER INGESTION ARE OFTEN ASSOCIATED WITH TOXIC REACTIONS.   cbc     Status: Abnormal   Collection Time: 12/27/15  5:25 PM  Result Value Ref Range   WBC 11.1 (H) 4.0 - 10.5 K/uL   RBC 5.61 4.22 - 5.81 MIL/uL   Hemoglobin 17.3 (H) 13.0 - 17.0 g/dL   HCT 46.7 39.0 - 52.0 %   MCV 83.2 78.0 - 100.0 fL   MCH 30.8 26.0 - 34.0 pg   MCHC 37.0 (H) 30.0 - 36.0 g/dL   RDW 12.0 11.5 - 15.5 %   Platelets 307 150 - 400 K/uL    Blood Alcohol level:  Lab Results  Component Value Date   [facility] <5 12/27/2015   ETH <5 77/82/4235    Metabolic Disorder Labs:  Lab Results  Component Value Date   HGBA1C 5.4 06/10/2015   Lab Results  Component Value Date   PROLACTIN 23.0 (H) 06/10/2015   Lab Results  Component Value Date   CHOL 171 06/10/2015   TRIG 112 06/10/2015   HDL 30 (L) 06/10/2015   CHOLHDL 5.7 06/10/2015   VLDL 22 06/10/2015   LDLCALC 119 (H) 06/10/2015    Current Medications: Current Facility-Administered Medications  Medication Dose Route Frequency Provider Last Rate Last Dose  . acetaminophen (TYLENOL) tablet 650 mg  650 mg Oral Q6H PRN Laverle Hobby, PA-C      . alum & mag hydroxide-simeth (MAALOX/MYLANTA) 200-200-20  MG/5ML suspension 30 mL  30 mL Oral Q4H PRN Laverle Hobby, PA-C      . ARIPiprazole (ABILIFY) tablet 5 mg  5 mg Oral Daily Laverle Hobby, PA-C      . divalproex (DEPAKOTE) DR tablet 250 mg  250 mg Oral QHS Laverle Hobby, PA-C   250 mg at 12/27/15 2344  . hydrOXYzine (ATARAX/VISTARIL) tablet 25 mg  25 mg Oral Q6H PRN Laverle Hobby, PA-C      . magnesium hydroxide (MILK OF MAGNESIA) suspension 30 mL  30 mL Oral Daily PRN Laverle Hobby, PA-C      . QUEtiapine (SEROQUEL) tablet 25 mg  25 mg Oral QHS,Riley X 1 Paislei Dorval E Vy Badley, PA-C   25 mg at 12/27/15 2344   PTA Medications: Prescriptions Prior to Admission  Medication Sig Dispense Refill Last Dose  . divalproex (DEPAKOTE) 500 MG DR tablet Take 3 tablets (1,500 mg total) by mouth at bedtime. (Patient not taking: Reported on 07/14/2015) 90 tablet 0 Not Taking at Unknown time  . fluPHENAZine decanoate (PROLIXIN) 25 MG/ML injection Inject into the muscle every 14 (fourteen) days.    Past Week at Unknown time  . traZODone (DESYREL) 100 MG tablet Take 1 tablet (100 mg total) by mouth at bedtime as needed for sleep. (Patient not taking: Reported on 07/14/2015) 30 tablet 0 Not Taking at Unknown time    Musculoskeletal: Strength & Muscle Tone: within normal limits Gait & Station: normal Patient leans: N/A  Psychiatric Specialty Exam: Physical Exam  Nursing note and vitals reviewed. Constitutional: He is oriented to person, place, and time. He appears well-developed and well-nourished.  HENT:  Head: Normocephalic.  Eyes: Pupils are equal, round, and reactive to light.  Neurological: He is alert  and oriented to person, place, and time. A cranial nerve deficit is present.  Skin: Skin is warm and dry.  Psychiatric: His speech is normal. His mood appears anxious. He is withdrawn. He exhibits a depressed mood.    Review of Systems  Psychiatric/Behavioral: Positive for depression and substance abuse. The patient is nervous/anxious.   All other  systems reviewed and are negative.   There were no vitals taken for this visit.There is no height or weight on file to calculate BMI.  General Appearance: Disheveled  Eye Contact:  Fair  Speech:  Clear and Coherent  Volume:  Normal  Mood:  Anxious and Depressed  Affect:  Congruent  Thought Process:  Goal Directed  Orientation:  Full (Time, Place, and Person)  Thought Content:  Rumination  Suicidal Thoughts:  No  Homicidal Thoughts:  No  Memory:  Immediate;   Fair  Judgement:  Impaired  Insight:  Lacking  Psychomotor Activity:  Negative  Concentration:  Concentration: Fair  Recall:  [employer] of Knowledge:  Fair  Language:  Fair  Akathisia:  Negative  Handed:  Right  AIMS (if indicated):     Assets:  Desire for Improvement  ADL's:  Intact  Cognition:  WNL  Sleep:         Treatment Plan Summary: Plan admmited to [facility] Observation unit for crises ibntervention,safety and stabilization, Further evalaution/disoposition per TTS in am  Observation Level/Precautions:  Continuous Observation Laboratory:   Psychotherapy:   Medications:   Consultations:   Discharge Concerns:   Estimated LOS: Other:      Nawaal Alling E, PA-C 8/10/20172:15 AM

## 2015-12-28 NOTE — ED Triage Notes (Signed)
Patient reports fall down two steps, landed on bottom, c/o bilateral knee pain. Also c/o insomnia x10 years and would like something for sleep. A&Ox4, ambulatory with steady gait.

## 2015-12-28 NOTE — ED Triage Notes (Signed)
Patient reports fall down two steps. Reports bilateral knee pain. Denies LOC, A&Ox4, ambulatory with steady gait.   Last VS: 122/88, 82hr, 14resp.

## 2015-12-28 NOTE — Progress Notes (Signed)
[facility] INPATIENT:  Family/Significant Other Suicide Prevention Education  Suicide Prevention Education:  Patient Refusal for Family/Significant Other Suicide Prevention Education: The patient [name] has refused to provide written consent for family/significant other to be provided Family/Significant Other Suicide Prevention Education during admission and/or prior to discharge.  Physician notified.  Glenice LaineIbekwe, Saagar Tortorella B 12/28/2015, 3:44 AM

## 2015-12-28 NOTE — Progress Notes (Signed)
Admission Note:  Patient is a 30 year old male [facility]  due to [facility]. On  admission, patient appears flat, was calm and cooperative with admission process. Patient  reports suicide ideation with no plan. Patient verbally consented for safety. Denies pain, AH/VH at this time. Patient could not identify any stressor but states that SI comes on his own. Denies pain, HI/VH at this time.  A: Skin was assessed, no contraband found. Skin intact, no wound/bruises noted. POC and unit policies explained and understanding verbalized. Consents obtained. Accepted food and fluids offered.  R: Patient had no additional questions or concerns.

## 2015-12-28 NOTE — Discharge Summary (Signed)
[place] SUMMARY   Patient Identification: [name] MRN:  [id number] Date of Evaluation:  12/28/2015 Chief Complaint:  Depression with passive SI Principal Diagnosis: Schizoaffective Disorder, Substance Induced Mood Disorder Diagnosis:   Patient Active Problem List   Diagnosis Date Noted  . Schizoaffective disorder, bipolar type ([place]) [F25.0] 10/21/2014    Priority: High  . Cocaine use disorder, moderate, dependence ([place]) [F14.20] 10/21/2014    Priority: High  . Schizophrenia, paranoid, chronic ([place]) [F20.0] 06/08/2015  . Tobacco use disorder [F17.200] 10/21/2014  . Cannabis use disorder, moderate, dependence ([place]) [F12.20] 10/21/2014   Subjective: Pt seen and chart reviewed. Pt is alert/oriented x4, calm, cooperative, and appropriate to situation. Pt denies suicidal/homicidal ideation and psychosis and does not appear to be responding to internal stimuli. Pt reports that he slept well and that he feels much better today, no longer feeling hopeless.   History of Present Illness: I have reviewed and concur with HPI elements below, modified as follows:  [name] is admitted to the [facility] earlier today due to depression symptoms with passive SI. [name] is endorsing such depressive symptoms over the past few months, which include anhedonia, despair, hopelessness and fatigue. He has had passive Si, but denies intent, plan or means. He is denying exacerbation of his depression due to external triggers. He noted compliance with his psychotropic medications, but continues to use Cocaine 1 - 2 times weekly. As noted above he was recently discharged from [facility] due to substance abuse. He is denying AVH, paranoia, delusions or thought insertion. He is denying hypo-mania, sleep disturbance , anxiety symptoms or panic attacks at this time. Please see TTS evaluation completed earlier for addition subjective information.  Pt spent the night in the [facility] without incident. Pt would like to discharge home to his Act Team.   Total Time spent with patient: 30 minutes  Psychological Evaluations: Yes  Past Medical History:  Past Medical History:  Diagnosis Date  . Depression   . Hypertension   . Schizoaffective disorder ([place])   . Schizophrenia [facility])     Past Surgical History:  Procedure Laterality Date  . BACK SURGERY     Family History:  Family History  Problem Relation Age of Onset  . Allergies Mother   . Asthma Mother    Tobacco Screening:   Social History:  History  Alcohol Use No     History  Drug Use No    Comment: Previous drug use hx    Additional Social History:                           Allergies:  No Known Allergies Lab Results:  Results for orders placed or performed during the hospital encounter of 12/27/15 (from the past 48 hour(s))  Rapid urine drug screen (hospital performed)     Status: Abnormal   Collection Time: 12/27/15  5:00 PM  Result Value Ref Range   Opiates NONE DETECTED NONE DETECTED   Cocaine POSITIVE (A) NONE DETECTED   Benzodiazepines NONE DETECTED NONE DETECTED   Amphetamines NONE DETECTED NONE DETECTED   Tetrahydrocannabinol POSITIVE (A) NONE DETECTED   Barbiturates NONE DETECTED NONE DETECTED    Comment:        DRUG SCREEN FOR MEDICAL PURPOSES ONLY.  IF CONFIRMATION IS NEEDED FOR ANY PURPOSE, NOTIFY LAB WITHIN 5 DAYS.        LOWEST DETECTABLE LIMITS FOR URINE DRUG SCREEN Drug  Class       Cutoff (ng/mL) Amphetamine      1000 Barbiturate      200 Benzodiazepine   893 Tricyclics       810 Opiates          300 Cocaine          300 THC              50   Comprehensive metabolic panel     Status: Abnormal   Collection Time: 12/27/15  5:25 PM  Result Value Ref Range   Sodium 137 135 - 145 mmol/L   Potassium 3.8 3.5 - 5.1 mmol/L   Chloride 105 101 - 111 mmol/L   CO2 22 22 - 32 mmol/L   Glucose, Bld 176 (H) 65 - 99 mg/dL   BUN 11 6 - 20 mg/dL   Creatinine, Ser  1.00 0.61 - 1.24 mg/dL   Calcium 9.8 8.9 - 10.3 mg/dL   Total Protein 8.0 6.5 - 8.1 g/dL   Albumin 4.4 3.5 - 5.0 g/dL   AST 27 15 - 41 U/L   ALT 50 17 - 63 U/L   Alkaline Phosphatase 72 38 - 126 U/L   Total Bilirubin 0.7 0.3 - 1.2 mg/dL   GFR calc non Af Amer >60 >60 mL/min   GFR calc Af Amer >60 >60 mL/min    Comment: (NOTE) The eGFR has been calculated using the CKD EPI equation. This calculation has not been validated in all clinical situations. eGFR's persistently <60 mL/min signify possible Chronic Kidney Disease.    Anion gap 10 5 - 15  Ethanol     Status: None   Collection Time: 12/27/15  5:25 PM  Result Value Ref Range   Alcohol, Ethyl (B) <5 <5 mg/dL    Comment:        LOWEST DETECTABLE LIMIT FOR SERUM ALCOHOL IS 5 mg/dL FOR MEDICAL PURPOSES ONLY   Salicylate level     Status: None   Collection Time: 12/27/15  5:25 PM  Result Value Ref Range   Salicylate Lvl <1.7 2.8 - 30.0 mg/dL  Acetaminophen level     Status: Abnormal   Collection Time: 12/27/15  5:25 PM  Result Value Ref Range   Acetaminophen (Tylenol), Serum <10 (L) 10 - 30 ug/mL    Comment:        THERAPEUTIC CONCENTRATIONS VARY SIGNIFICANTLY. A RANGE OF 10-30 ug/mL MAY BE AN EFFECTIVE CONCENTRATION FOR MANY PATIENTS. HOWEVER, SOME ARE BEST TREATED AT CONCENTRATIONS OUTSIDE THIS RANGE. ACETAMINOPHEN CONCENTRATIONS >150 ug/mL AT 4 HOURS AFTER INGESTION AND >50 ug/mL AT 12 HOURS AFTER INGESTION ARE OFTEN ASSOCIATED WITH TOXIC REACTIONS.   cbc     Status: Abnormal   Collection Time: 12/27/15  5:25 PM  Result Value Ref Range   WBC 11.1 (H) 4.0 - 10.5 K/uL   RBC 5.61 4.22 - 5.81 MIL/uL   Hemoglobin 17.3 (H) 13.0 - 17.0 g/dL   HCT 46.7 39.0 - 52.0 %   MCV 83.2 78.0 - 100.0 fL   MCH 30.8 26.0 - 34.0 pg   MCHC 37.0 (H) 30.0 - 36.0 g/dL   RDW 12.0 11.5 - 15.5 %   Platelets 307 150 - 400 K/uL    Blood Alcohol level:  Lab Results  Component Value Date   [facility] <5 12/27/2015   ETH <5 51/06/5850     Metabolic Disorder Labs:  Lab Results  Component Value Date   HGBA1C 5.4 06/10/2015   Lab Results  Component Value Date   PROLACTIN 23.0 (H) 06/10/2015   Lab Results  Component Value Date   CHOL 171 06/10/2015   TRIG 112 06/10/2015   HDL 30 (L) 06/10/2015   CHOLHDL 5.7 06/10/2015   VLDL 22 06/10/2015   LDLCALC 119 (H) 06/10/2015    Current Medications: Current Facility-Administered Medications  Medication Dose Route Frequency Provider Last Rate Last Dose  . acetaminophen (TYLENOL) tablet 650 mg  650 mg Oral Q6H PRN Laverle Hobby, PA-C      . alum & mag hydroxide-simeth (MAALOX/MYLANTA) 200-200-20 MG/5ML suspension 30 mL  30 mL Oral Q4H PRN Laverle Hobby, PA-C      . ARIPiprazole (ABILIFY) tablet 5 mg  5 mg Oral Daily Laverle Hobby, PA-C   5 mg at 12/28/15 8592  . divalproex (DEPAKOTE) DR tablet 250 mg  250 mg Oral QHS Laverle Hobby, PA-C   250 mg at 12/27/15 2344  . hydrOXYzine (ATARAX/VISTARIL) tablet 25 mg  25 mg Oral Q6H PRN Laverle Hobby, PA-C      . magnesium hydroxide (MILK OF MAGNESIA) suspension 30 mL  30 mL Oral Daily PRN Laverle Hobby, PA-C      . QUEtiapine (SEROQUEL) tablet 25 mg  25 mg Oral QHS,MR X 1 Spencer E Simon, PA-C   25 mg at 12/27/15 2344   PTA Medications: Prescriptions Prior to Admission  Medication Sig Dispense Refill Last Dose  . fluPHENAZine decanoate (PROLIXIN) 25 MG/ML injection Inject into the muscle every 14 (fourteen) days.    Past Week at Unknown time  . divalproex (DEPAKOTE) 500 MG DR tablet Take 3 tablets (1,500 mg total) by mouth at bedtime. (Patient not taking: Reported on 07/14/2015) 90 tablet 0 Not Taking at Unknown time  . traZODone (DESYREL) 100 MG tablet Take 1 tablet (100 mg total) by mouth at bedtime as needed for sleep. (Patient not taking: Reported on 07/14/2015) 30 tablet 0 Not Taking at Unknown time    Musculoskeletal: Strength & Muscle Tone: within normal limits Gait & Station: normal Patient leans:  N/A  Psychiatric Specialty Exam: Physical Exam  Nursing note and vitals reviewed. Constitutional: He is oriented to person, place, and time. He appears well-developed and well-nourished.  HENT:  Head: Normocephalic.  Eyes: Pupils are equal, round, and reactive to light.  Neurological: He is alert and oriented to person, place, and time. A cranial nerve deficit is present.  Skin: Skin is warm and dry.  Psychiatric: His speech is normal. His mood appears anxious. He is withdrawn. He exhibits a depressed mood.    Review of Systems  Psychiatric/Behavioral: Positive for depression and substance abuse. Negative for hallucinations and suicidal ideas. The patient is nervous/anxious and has insomnia.   All other systems reviewed and are negative.   Blood pressure 105/66, pulse (!) 54, temperature 98.4 F (36.9 C), temperature source Oral, resp. rate 18, height 5' 10"  (1.778 m), weight 107 kg (236 lb), SpO2 98 %.Body mass index is 33.86 kg/m.  General Appearance: Casual and Fairly Groomed  Eye Contact:  Fair  Speech:  Clear and Coherent  Volume:  Normal  Mood:  Anxious and Depressed  Affect:  Congruent  Thought Process:  Goal Directed  Orientation:  Full (Time, Place, and Person)  Thought Content:  symptoms, worries, concerns  Suicidal Thoughts:  No  Homicidal Thoughts:  No  Memory:  Immediate;   Fair  Judgement:  Impaired  Insight:  Lacking  Psychomotor Activity:  Negative  Concentration:  Concentration:  Fair  Recall:  Smiley Houseman of Knowledge:  Fair  Language:  Fair  Akathisia:  Negative  Handed:  Right  AIMS (if indicated):     Assets:  Desire for Improvement  ADL's:  Intact  Cognition:  WNL  Sleep:      Treatment Plan Summary: Schizoaffective disorder, bipolar type ([place])  Stable for outpatient management with his [facility]  Disposition:  -Discharge into care of [facility] (coming to pick him up) Med management (they will control this with the exception of a new  script for Depakote and Trazodone which pt has responded well to in the past)   Benjamine Mola, FNP 8/10/201712:19 PM  Agree with  NP note and assessment

## 2015-12-29 MED ORDER — NAPROXEN 500 MG PO TABS: 500.0000 mg | ORAL_TABLET | Freq: Two times a day (BID) | ORAL | 0 refills | Status: DC

## 2015-12-29 NOTE — ED Provider Notes (Signed)
[facility]-EMERGENCY DEPT Provider Note   CSN: [id number] Arrival date & time: 12/28/15  2223  First Provider Contact:  None       History   Chief Complaint Chief Complaint  Patient presents with  . Fall  . Knee Pain    HPI [name] is a 30 y.o. male.  Patient is a 30 year old male who presents to the emergency department for evaluation of left knee pain. He states that he sustained his pain this evening as he fell down 2 steps and landed on his bottom. He states that he feels as though his knee twisted wrong and he may have broken something. He does state that he has been walking since the incident. He has not taken any medication for pain. No extremity weakness or numbness. No bowel or bladder incontinence. Patient denies head trauma and loss of consciousness.   The history is provided by the patient. No language interpreter was used.  Fall   Knee Pain      Past Medical History:  Diagnosis Date  . Depression   . Hypertension   . Schizoaffective disorder (H[facility])   . Schizophrenia [facility](HCC)     Patient Active Problem List   Diagnosis Date Noted  . Schizophrenia, paranoid, chronic (H[facility]) 06/08/2015  . Schizoaffective disorder, bipolar type ([facility]) 10/21/2014  . Tobacco use disorder 10/21/2014  . Cocaine use disorder, moderate, dependence ([facility]) 10/21/2014  . Cannabis use disorder, moderate, dependence ([facility]) 10/21/2014    Past Surgical History:  Procedure Laterality Date  . BACK SURGERY         Home Medications    Prior to Admission medications   Medication Sig Start Date End Date Taking? Authorizing Provider  divalproex (DEPAKOTE) 250 MG DR tablet Take 2 tablets (500 mg total) by mouth at bedtime. 12/28/15  Yes Beau FannyJohn C Withrow, FNP  fluPHENAZine decanoate (PROLIXIN) 25 MG/ML injection Inject into the muscle every 14 (fourteen) days.  05/16/15  Yes Historical Provider, MD  traZODone (DESYREL) 100 MG tablet Take 1 tablet (100 mg total) by mouth at bedtime as needed for  sleep. 12/28/15  Yes Beau FannyJohn C Withrow, FNP  naproxen (NAPROSYN) 500 MG tablet Take 1 tablet (500 mg total) by mouth 2 (two) times daily. 12/29/15   Antony MaduraKelly Kalika Smay, PA-C    Family History Family History  Problem Relation Age of Onset  . Allergies Mother   . Asthma Mother     Social History Social History  Substance Use Topics  . Smoking status: Current Every Day Smoker    Packs/day: 1.00    Years: 8.00    Types: Cigarettes, Pipe  . Smokeless tobacco: Current User  . Alcohol use No     Allergies   Review of patient's allergies indicates no known allergies.   Review of Systems Review of Systems  Musculoskeletal: Positive for arthralgias.  Ten systems reviewed and are negative for acute change, except as noted in the HPI.    Physical Exam Updated Vital Signs BP 121/80   Pulse 77   Temp 98.5 F (36.9 C) (Oral)   Resp 20   SpO2 99%   Physical Exam  Constitutional: He is oriented to person, place, and time. He appears well-developed and well-nourished. No distress.  Nontoxic appearing  HENT:  Head: Normocephalic and atraumatic.  Eyes: Conjunctivae and EOM are normal. No scleral icterus.  Neck: Normal range of motion.  Cardiovascular: Normal rate, regular rhythm and intact distal pulses.   DP and PT pulses 2+ bilaterally  Pulmonary/Chest: Effort  normal. No respiratory distress.  Respirations even and unlabored  Musculoskeletal: Normal range of motion. He exhibits no edema or deformity.  No bony deformity, crepitus, or malalignment of the left knee. Patient exhibits normal range of motion of his left knee. There is no significant swelling. No effusion, erythema, or heat to touch.  Neurological: He is alert and oriented to person, place, and time.  GCS 15. Patient moving all extremities. He is ambulatory with steady gait.  Skin: Skin is warm and dry. No rash noted. He is not diaphoretic. No erythema. No pallor.  Psychiatric: He has a normal mood and affect. His behavior is  normal.  Nursing note and vitals reviewed.    ED Treatments / Results  Labs (all labs ordered are listed, but only abnormal results are displayed) Labs Reviewed - No data to display  EKG  EKG Interpretation None       Radiology No results found.  Procedures Procedures (including critical care time)  Medications Ordered in ED Medications - No data to display   Initial Impression / Assessment and Plan / ED Course  I have reviewed the triage vital signs and the nursing notes.  Pertinent labs & imaging results that were available during my care of the patient were reviewed by me and considered in my medical decision making (see chart for details).  Clinical Course    30 year old male presents to the emergency department for evaluation of left knee pain. Patient neurovascularly intact. No concern for septic joint. Highly doubt fracture as patient is weightbearing. He has no bony deformity, crepitus, or other evidence to suggest acute medical injury. No indication for further emergent workup or imaging. Patient discharged with instructions for RICE and NSAIDs. No indication for further emergent workup. Patient discharged in satisfactory condition.   Final Clinical Impressions(s) / ED Diagnoses   Final diagnoses:  Knee pain, acute, unspecified laterality    New Prescriptions Discharge Medication List as of 12/29/2015  4:25 AM    START taking these medications   Details  naproxen (NAPROSYN) 500 MG tablet Take 1 tablet (500 mg total) by mouth 2 (two) times daily., Starting Fri 12/29/2015, Print         [place], PA-C 12/29/15 1610    Paula Libra, MD 12/29/15 [id number]

## 2016-01-10 ENCOUNTER — Ambulatory Visit (HOSPITAL_COMMUNITY)
Admission: RE | Admit: 2016-01-10 | Discharge: 2016-01-10 | Disposition: A | Discharge status: Home / Self Care | Payer: Medicare Other | Attending: Psychiatry | Admitting: Psychiatry

## 2016-01-10 DIAGNOSIS — F17210 Nicotine dependence, cigarettes, uncomplicated: Secondary | ICD-10-CM | POA: Diagnosis not present

## 2016-01-10 DIAGNOSIS — F209 Schizophrenia, unspecified: Secondary | ICD-10-CM | POA: Insufficient documentation

## 2016-01-10 DIAGNOSIS — I10 Essential (primary) hypertension: Secondary | ICD-10-CM | POA: Diagnosis not present

## 2016-01-10 DIAGNOSIS — F331 Major depressive disorder, recurrent, moderate: Secondary | ICD-10-CM | POA: Insufficient documentation

## 2016-01-10 DIAGNOSIS — F259 Schizoaffective disorder, unspecified: Secondary | ICD-10-CM | POA: Diagnosis not present

## 2016-01-10 DIAGNOSIS — F1414 Cocaine abuse with cocaine-induced mood disorder: Secondary | ICD-10-CM | POA: Diagnosis present

## 2016-01-10 NOTE — BH Assessment (Signed)
Tele Assessment Note   [name] is an 30 y.o. male.  -Patient was a walk-in to [facility].  Brought in by mother, she did not stay in room during assessment.  Patient said that he was having suicidal ideations.  When asked if he had a plan patient said "no."  Patient has no intention, patient said that "I don't know" when asked if he could stay safe at home.  Patient denies any previous attempts.  Has had some SI in the last 6 months.    Patient denies any HI or A/V hallucinations.  Patient currently denies any depressive symptoms and cannot identify any stressors.  Patient uses cocaine at varying intervals.  He last used some yesterday.  Patient has ACTT team services through [facility].  Patient says he was to get his Prolixin IM shot on 08/21 but missed it.  He has these shots every 2 weeks.  Patient says he talked to his ACTT team yesterday and they said they would come out later in the week to get him his shot.  Patient's ACTT team worker is Judeth Cornfield [phone].    -Clinician discussed patient care with Donell Sievert, PA.  He said that patient could contact his ACTT team tomorrow to get his shot taken care of.  If he felt he was at risk of actually harming himself we could provide an OBS bed.  Clinician went back and talked with patient & mother.  Before clinician spoke, patient said that he would like to come back tomorrow (08/24).  Clinician told him that he could contact his ACTT team person tomorrow if he could stay safe.  Mother and patient said that he could do so.  Patient was also told that he could go to [facility] for medical clearance and come to OBS bed but that he would still need to get in touch with ACTT team tomorrow.  Patient opted to return home and contact ACTT team in the morning, mother said that she would assist him.  Patient signed a "declination of MSE" form, then left with mother.  Diagnosis: MDD recurrrent, moderate; Cocaine use d/o moderate  Past Medical  History:  Past Medical History:  Diagnosis Date  . Depression   . Hypertension   . Schizoaffective disorder (HCC)   . Schizophrenia [facility])     Past Surgical History:  Procedure Laterality Date  . BACK SURGERY      Family History:  Family History  Problem Relation Age of Onset  . Allergies Mother   . Asthma Mother     Social History:  reports that he has been smoking Cigarettes and Pipe.  He has a 8.00 pack-year smoking history. He uses smokeless tobacco. He reports that he does not drink alcohol or use drugs.  Additional Social History:  Alcohol / Drug Use Pain Medications: N/A Prescriptions: Prolixin IM q 2 weeks.  Was due on 01/08/16. Over the Counter: N/A History of alcohol / drug use?: Yes Substance #1 Name of Substance 1: Cocaine 1 - Age of First Use: 30 years of age 49 - Amount (size/oz): Varies according to finances 1 - Frequency: Varies 1 - Duration: on-going 1 - Last Use / Amount: 01/09/16  CIWA:   COWS:    PATIENT STRENGTHS: (choose at least two) Average or above average intelligence Capable of independent living Communication skills Supportive family/friends  Allergies: No Known Allergies  Home Medications:  (Not in a hospital admission)  OB/GYN Status:  No LMP for male patient.  General  Assessment Data [facility] Assessment: In system Is this a Tele or Face-to-Face Assessment?: Face-to-Face Is this an Initial Assessment or a Re-assessment for this encounter?: Initial Assessment Marital status: Single Is patient pregnant?: No Pregnancy Status: No Living Arrangements: Other (Comment) (Lives in apt w/ roommate) Can pt return to current living arrangement?: Yes Admission Status: Voluntary Is patient capable of signing voluntary admission?: Yes Referral Source: Self/Family/Friend (Mother brought patient in.) [facility]  Medical Screening Exam [facility] Walk[facility] ONLY) Medical Exam completed: No Reason  for MSE not completed: Patient Refused (Pt declined MSE)  Crisis Care Plan Living Arrangements: Other (Comment) (Lives in apt w/ roommate) Name of Psychiatrist: [facility]  Name of Therapist: [facility]  Education Status Is patient currently in school?: No  Risk to self with the past 6 months Suicidal Ideation: Yes-Currently Present Has patient been a risk to self within the past 6 months prior to admission? : Yes Suicidal Intent: No Has patient had any suicidal intent within the past 6 months prior to admission? : Yes Is patient at risk for suicide?: No Suicidal Plan?: No Has patient had any suicidal plan within the past 6 months prior to admission? : No Specify Current Suicidal Plan: None Access to Means: No What has been your use of drugs/alcohol within the last 12 months?: Cocaine Previous Attempts/Gestures: No How many times?: 0 Other Self Harm Risks: None reported Triggers for Past Attempts: None known Intentional Self Injurious Behavior: None Family Suicide History: Unknown Recent stressful life event(s): Other (Comment) (Pt cannot identify any stressors) Persecutory voices/beliefs?: No Depression: Yes Depression Symptoms: Despondent, Loss of interest in usual pleasures Substance abuse history and/or treatment for substance abuse?: Yes Suicide prevention information given to non-admitted patients: Yes  Risk to Others within the past 6 months Homicidal Ideation: No Does patient have any lifetime risk of violence toward others beyond the six months prior to admission? : No Thoughts of Harm to Others: No Current Homicidal Intent: No Current Homicidal Plan: No Access to Homicidal Means: No Identified Victim: No one History of harm to others?: No Assessment of Violence: None Noted Violent Behavior Description: None reported Does patient have access to weapons?: No Criminal Charges Pending?: No Does patient have a court date: No Is  patient on probation?: No  Psychosis Hallucinations: None noted Delusions: None noted  Mental Status Report Appearance/Hygiene: Unremarkable Eye Contact: Good Motor Activity: Freedom of movement, Unremarkable Speech: Logical/coherent Level of Consciousness: Alert Mood: Pleasant Affect: Appropriate to circumstance Anxiety Level: Minimal Thought Processes: Coherent, Relevant Judgement: Impaired Orientation: Person, Place, Time, Situation Obsessive Compulsive Thoughts/Behaviors: None  Cognitive Functioning Concentration: Normal Memory: Recent Intact, Remote Intact IQ: Average Insight: Fair Impulse Control: Fair Appetite: Good Weight Loss: 0 Weight Gain: 0 Sleep: No Change Total Hours of Sleep: 10 Vegetative Symptoms: None  ADLScreening [facility] Assessment Services) Patient's cognitive ability adequate to safely complete daily activities?: Yes Patient able to express need for assistance with ADLs?: Yes Independently performs ADLs?: Yes (appropriate for developmental age)  Prior Inpatient Therapy Prior Inpatient Therapy: Yes Prior Therapy Dates: 05/2015; 12/27/15 [facility] Reason for Treatment: SI  Prior Outpatient Therapy Prior Outpatient Therapy: Yes Prior Therapy Dates: Current Prior Therapy Facilty/Provider(s): [facility] Reason for Treatment: ACTT team services Does patient have an ACCT team?: Yes Does patient have Intensive In-House Services?  : No Does patient have [facility] services? : No Does patient have P4CC services?: No  ADL Screening (condition at time  of admission) Patient's cognitive ability adequate to safely complete daily activities?: Yes Is the patient deaf or have difficulty hearing?: No Does the patient have difficulty seeing, even when wearing glasses/contacts?: No Does the patient have difficulty concentrating, remembering, or making decisions?: No Patient able to express need for assistance with ADLs?:  Yes Does the patient have difficulty dressing or bathing?: No Independently performs ADLs?: Yes (appropriate for developmental age) Does the patient have difficulty walking or climbing stairs?: No Weakness of Legs: None Weakness of Arms/Hands: None       Abuse/Neglect Assessment (Assessment to be complete while patient is alone) Physical Abuse: Denies Verbal Abuse: Denies Sexual Abuse: Denies Exploitation of patient/patient's resources: Denies Self-Neglect: Denies     Merchant navy officerAdvance Directives (For Healthcare) Does patient have an advance directive?: No Would patient like information on creating an advanced directive?: No - patient declined information    Additional Information 1:1 In Past 12 Months?: No CIRT Risk: No Elopement Risk: No Does patient have medical clearance?: No     Disposition:  Disposition Initial Assessment Completed for this Encounter: Yes Disposition of Patient: Other dispositions (Pt to contact his ACTT team) Other disposition(s): To current provider (Pt to contact [facility].)  Alexandria LodgeHarvey, Cecile Gillispie Ray 01/10/2016 8:46 PM

## 2016-07-10 DIAGNOSIS — J111 Influenza due to unidentified influenza virus with other respiratory manifestations: Secondary | ICD-10-CM | POA: Diagnosis not present

## 2016-09-21 ENCOUNTER — Encounter (HOSPITAL_COMMUNITY): Payer: Self-pay

## 2016-09-21 ENCOUNTER — Emergency Department (HOSPITAL_COMMUNITY)
Admission: EM | Admit: 2016-09-21 | Discharge: 2016-09-21 | Disposition: A | Discharge status: Home / Self Care | Payer: Medicare Other | Attending: Emergency Medicine | Admitting: Emergency Medicine

## 2016-09-21 DIAGNOSIS — R531 Weakness: Secondary | ICD-10-CM | POA: Insufficient documentation

## 2016-09-21 DIAGNOSIS — Z79899 Other long term (current) drug therapy: Secondary | ICD-10-CM | POA: Diagnosis not present

## 2016-09-21 DIAGNOSIS — I10 Essential (primary) hypertension: Secondary | ICD-10-CM | POA: Diagnosis not present

## 2016-09-21 DIAGNOSIS — F17210 Nicotine dependence, cigarettes, uncomplicated: Secondary | ICD-10-CM | POA: Insufficient documentation

## 2016-09-21 LAB — I-STAT CHEM 8, ED
BUN: 16 mg/dL (ref 6–20)
Calcium, Ion: 1.16 mmol/L (ref 1.15–1.40)
Chloride: 102 mmol/L (ref 101–111)
Creatinine, Ser: 1.2 mg/dL (ref 0.61–1.24)
Glucose, Bld: 196 mg/dL — ABNORMAL HIGH (ref 65–99)
HEMATOCRIT: 47 % (ref 39.0–52.0)
HEMOGLOBIN: 16 g/dL (ref 13.0–17.0)
POTASSIUM: 3.8 mmol/L (ref 3.5–5.1)
Sodium: 137 mmol/L (ref 135–145)
TCO2: 27 mmol/L (ref 0–100)

## 2016-09-21 NOTE — ED Triage Notes (Signed)
He c/o nausea and bloating and mild shortness of breath "for years now". He states he is becoming "frustrated with putting up with it". He is in no distress.

## 2016-09-21 NOTE — ED Provider Notes (Signed)
[facility]-EMERGENCY DEPT Provider Note   CSN: [id number] Arrival date & time: 09/21/16  1236     History   Chief Complaint Chief Complaint  Patient presents with  . Nausea    HPI [name] is a 31 y.o. male.  31 year old male with history of schizophrenia as well as cocaine addiction presents with several year history of chronic nausea as well as whole body weakness. States that when the tries to stand up he feels weak all over. Denies any change in bowel or bladder function. No headaches noted. Last used cocaine several days ago. Does get im injections of Haldol. He denies any psychiatric complaints at this time. He denies any new medication started. Nothing makes his symptoms better.      Past Medical History:  Diagnosis Date  . Depression   . Hypertension   . Schizoaffective disorder (HCC)   . Schizophrenia [facility](HCC)     Patient Active Problem List   Diagnosis Date Noted  . Schizophrenia, paranoid, chronic (HCC) 06/08/2015  . Schizoaffective disorder, bipolar type (HCC) 10/21/2014  . Tobacco use disorder 10/21/2014  . Cocaine use disorder, moderate, dependence (HCC) 10/21/2014  . Cannabis use disorder, moderate, dependence (HCC) 10/21/2014    Past Surgical History:  Procedure Laterality Date  . BACK SURGERY         Home Medications    Prior to Admission medications   Medication Sig Start Date End Date Taking? Authorizing Provider  divalproex (DEPAKOTE) 250 MG DR tablet Take 2 tablets (500 mg total) by mouth at bedtime. 12/28/15   Withrow, Everardo AllJohn C, FNP  fluPHENAZine decanoate (PROLIXIN) 25 MG/ML injection Inject into the muscle every 14 (fourteen) days.  05/16/15   [provider]  naproxen (NAPROSYN) 500 MG tablet Take 1 tablet (500 mg total) by mouth 2 (two) times daily. 12/29/15   Antony MaduraHumes, Kelly, PA-C  traZODone (DESYREL) 100 MG tablet Take 1 tablet (100 mg total) by mouth at bedtime as needed for sleep. 12/28/15   Withrow, Everardo AllJohn C, FNP    Family  History Family History  Problem Relation Age of Onset  . Allergies Mother   . Asthma Mother     Social History Social History  Substance Use Topics  . Smoking status: Current Every Day Smoker    Packs/day: 1.00    Years: 8.00    Types: Cigarettes, Pipe  . Smokeless tobacco: Current User  . Alcohol use No     Allergies   Patient has no known allergies.   Review of Systems Review of Systems  All other systems reviewed and are negative.    Physical Exam Updated Vital Signs BP 125/86 (BP Location: Left Arm)   Pulse (!) 109   Temp 98.8 F (37.1 C) (Oral)   Resp 18   SpO2 98%   Physical Exam  Constitutional: He is oriented to person, place, and time. He appears well-developed and well-nourished.  Non-toxic appearance. No distress.  HENT:  Head: Normocephalic and atraumatic.  Eyes: Conjunctivae, EOM and lids are normal. Pupils are equal, round, and reactive to light.  Neck: Normal range of motion. Neck supple. No tracheal deviation present. No thyroid mass present.  Cardiovascular: Regular rhythm and normal heart sounds.  Tachycardia present.  Exam reveals no gallop.   No murmur heard. Pulmonary/Chest: Effort normal and breath sounds normal. No stridor. No respiratory distress. He has no decreased breath sounds. He has no wheezes. He has no rhonchi. He has no rales.  Abdominal: Soft. Normal appearance and bowel sounds  are normal. He exhibits no distension. There is no tenderness. There is no rebound and no CVA tenderness.  Musculoskeletal: Normal range of motion. He exhibits no edema or tenderness.  Neurological: He is alert and oriented to person, place, and time. He has normal strength. No cranial nerve deficit or sensory deficit. GCS eye subscore is 4. GCS verbal subscore is 5. GCS motor subscore is 6.  Skin: Skin is warm and dry. No abrasion and no rash noted.  Psychiatric: His speech is normal and behavior is normal. His affect is blunt.  Nursing note and vitals  reviewed.    ED Treatments / Results  Labs (all labs ordered are listed, but only abnormal results are displayed) Labs Reviewed  I-STAT CHEM 8, ED    EKG  EKG Interpretation None       Radiology No results found.  Procedures Procedures (including critical care time)  Medications Ordered in ED Medications - No data to display   Initial Impression / Assessment and Plan / ED Course  I have reviewed the triage vital signs and the nursing notes.  Pertinent labs & imaging results that were available during my care of the patient were reviewed by me and considered in my medical decision making (see chart for details).     Hallucinations electrolytes within normal limits. Stable for discharge  Final Clinical Impressions(s) / ED Diagnoses   Final diagnoses:  None    New Prescriptions New Prescriptions   No medications on file     Lorre Nick, MD 09/21/16 1502

## 2017-06-23 DIAGNOSIS — K219 Gastro-esophageal reflux disease without esophagitis: Secondary | ICD-10-CM | POA: Diagnosis not present

## 2017-06-23 DIAGNOSIS — R7309 Other abnormal glucose: Secondary | ICD-10-CM | POA: Diagnosis not present

## 2017-06-23 DIAGNOSIS — J301 Allergic rhinitis due to pollen: Secondary | ICD-10-CM | POA: Diagnosis not present

## 2017-06-26 DIAGNOSIS — F250 Schizoaffective disorder, bipolar type: Secondary | ICD-10-CM | POA: Diagnosis not present

## 2017-11-15 ENCOUNTER — Emergency Department (HOSPITAL_COMMUNITY)
Admission: EM | Admit: 2017-11-15 | Discharge: 2017-11-16 | Disposition: A | Discharge status: Home / Self Care | Payer: Medicare Other | Attending: Emergency Medicine | Admitting: Emergency Medicine

## 2017-11-15 ENCOUNTER — Encounter (HOSPITAL_COMMUNITY): Payer: Self-pay | Admitting: Emergency Medicine

## 2017-11-15 DIAGNOSIS — I10 Essential (primary) hypertension: Secondary | ICD-10-CM | POA: Insufficient documentation

## 2017-11-15 DIAGNOSIS — F1414 Cocaine abuse with cocaine-induced mood disorder: Secondary | ICD-10-CM | POA: Diagnosis present

## 2017-11-15 DIAGNOSIS — R45851 Suicidal ideations: Secondary | ICD-10-CM | POA: Diagnosis not present

## 2017-11-15 DIAGNOSIS — Z046 Encounter for general psychiatric examination, requested by authority: Secondary | ICD-10-CM

## 2017-11-15 DIAGNOSIS — R443 Hallucinations, unspecified: Secondary | ICD-10-CM

## 2017-11-15 DIAGNOSIS — Z79899 Other long term (current) drug therapy: Secondary | ICD-10-CM | POA: Diagnosis not present

## 2017-11-15 DIAGNOSIS — F17210 Nicotine dependence, cigarettes, uncomplicated: Secondary | ICD-10-CM | POA: Insufficient documentation

## 2017-11-15 DIAGNOSIS — F1910 Other psychoactive substance abuse, uncomplicated: Secondary | ICD-10-CM

## 2017-11-15 DIAGNOSIS — R739 Hyperglycemia, unspecified: Secondary | ICD-10-CM | POA: Diagnosis not present

## 2017-11-15 DIAGNOSIS — Z008 Encounter for other general examination: Secondary | ICD-10-CM

## 2017-11-15 DIAGNOSIS — F250 Schizoaffective disorder, bipolar type: Secondary | ICD-10-CM | POA: Diagnosis present

## 2017-11-15 DIAGNOSIS — Z0289 Encounter for other administrative examinations: Secondary | ICD-10-CM | POA: Diagnosis not present

## 2017-11-15 DIAGNOSIS — F329 Major depressive disorder, single episode, unspecified: Secondary | ICD-10-CM | POA: Diagnosis present

## 2017-11-15 DIAGNOSIS — Z720 Tobacco use: Secondary | ICD-10-CM

## 2017-11-15 LAB — CBC
HEMATOCRIT: 43.6 % (ref 39.0–52.0)
HEMOGLOBIN: 15.6 g/dL (ref 13.0–17.0)
MCH: 30.8 pg (ref 26.0–34.0)
MCHC: 35.8 g/dL (ref 30.0–36.0)
MCV: 86 fL (ref 78.0–100.0)
Platelets: 306 10*3/uL (ref 150–400)
RBC: 5.07 MIL/uL (ref 4.22–5.81)
RDW: 12.2 % (ref 11.5–15.5)
WBC: 12.1 10*3/uL — AB (ref 4.0–10.5)

## 2017-11-15 LAB — COMPREHENSIVE METABOLIC PANEL
ALBUMIN: 3.8 g/dL (ref 3.5–5.0)
ALT: 50 U/L — ABNORMAL HIGH (ref 0–44)
ANION GAP: 8 (ref 5–15)
AST: 45 U/L — AB (ref 15–41)
Alkaline Phosphatase: 77 U/L (ref 38–126)
BUN: 13 mg/dL (ref 6–20)
CHLORIDE: 105 mmol/L (ref 98–111)
CO2: 26 mmol/L (ref 22–32)
Calcium: 9 mg/dL (ref 8.9–10.3)
Creatinine, Ser: 1.08 mg/dL (ref 0.61–1.24)
GFR calc Af Amer: >60 mL/min (ref >60)
GFR calc non Af Amer: >60 mL/min (ref >60)
GLUCOSE: 162 mg/dL — AB (ref 70–99)
POTASSIUM: 3.6 mmol/L (ref 3.5–5.1)
SODIUM: 139 mmol/L (ref 135–145)
TOTAL PROTEIN: 7.2 g/dL (ref 6.5–8.1)
Total Bilirubin: 0.1 mg/dL — ABNORMAL LOW (ref 0.3–1.2)

## 2017-11-15 LAB — SALICYLATE LEVEL: Salicylate Lvl: <7 mg/dL (ref 2.8–30.0)

## 2017-11-15 LAB — ACETAMINOPHEN LEVEL

## 2017-11-15 LAB — ETHANOL: Alcohol, Ethyl (B): <10 mg/dL (ref <10)

## 2017-11-15 MED ORDER — ONDANSETRON HCL 4 MG PO TABS: 4.0000 mg | ORAL_TABLET | Freq: Three times a day (TID) | ORAL | Status: DC | PRN

## 2017-11-15 MED ORDER — ALUM & MAG HYDROXIDE-SIMETH 200-200-20 MG/5ML PO SUSP: 30.0000 mL | Freq: Four times a day (QID) | ORAL | Status: DC | PRN

## 2017-11-15 MED ORDER — ZOLPIDEM TARTRATE 5 MG PO TABS: 5.0000 mg | ORAL_TABLET | Freq: Every evening | ORAL | Status: DC | PRN

## 2017-11-15 MED ORDER — NICOTINE 21 MG/24HR TD PT24
21.0000 mg | MEDICATED_PATCH | Freq: Every day | TRANSDERMAL | Status: DC
  Filled 2017-11-15: qty 1

## 2017-11-15 MED ORDER — IBUPROFEN 200 MG PO TABS: 600.0000 mg | ORAL_TABLET | Freq: Three times a day (TID) | ORAL | Status: DC | PRN

## 2017-11-15 MED ORDER — AMLODIPINE BESYLATE 5 MG PO TABS
2.5000 mg | ORAL_TABLET | Freq: Every day | ORAL | Status: DC
  Administered 2017-11-16: 2.5 mg via ORAL
  Filled 2017-11-15: qty 1

## 2017-11-15 NOTE — ED Triage Notes (Signed)
Patient brought in by [employer] from home. Per [employer] Mother stated  "patient have thoughts of hurting himself and aggressive behaviour towards her.

## 2017-11-15 NOTE — BH Assessment (Addendum)
Tele Assessment Note   Patient Name: [name] MRN: [id number] Referring Physician: Rhona RaiderMercedes Street, PA-C Location of Patient: [facility], [phone] Location of Provider: Behavioral Health TTS Department  [name] is an 32 y.o. male who presents unaccompanied to [facility] ED after being petitioned for involuntary commitment by his mother, [name] [phone]. Affidavit and petition states: "A danger to self and others, to wit: Has schizoaffective disorder and is taking meds but seem to be doing no good; states to petitioner that he wants to end his life; depressed; believe he is having auditory hallucinations because talks to someone all the time and there is no one there; petitioner believes he is using illicit drugs; has been aggressive toward petitioner." Pt reports he has been brought to the ED "because I have suicidal tendencies." Pt is a poor historian due to his current mental status. Pt's thought process appears disorganized and he often provides tangential responses to questions. Pt is also responding to internal stimuli and is carry on a conversation with someone not in the room during assessment. Pt says his mood is "mediocre." Pt says he is "concerned with my identity" and when asked in what way he is concerned responds "that is a very good question." Pt acknowledges suicidal ideation with no clear plan. He denies homicidal ideation or history of violence.   Pt reports he uses alcohol, marijuana and cocaine but is unable to provide details of use. He says he would use more frequently but he cannot afford it. Pt says he is very concerned that he is smoking one pack of cigarettes daily.  Pt says he lives with a roommate. He identifies his mother as supportive. He receives fluphenazine shots for his psychiatric condition, every 2 weeks; last received it one week ago.  He is supposed to be on other medications however he isn't taking any of them. Pt reports he has been  psychiatrically hospitalized in the past but cannot remember where and when. He says he has guns in his apartment "that have been approved by the federal government."  Pt is dressed in hospital scrubs, alert and oriented x4. Pt speaks in a clear tone, at moderate volume and normal pace. Motor behavior appears normal. Eye contact is good. Pt's mood is anxious and preoccupied and affect is congruent with mood. Thought process is tangential and disorganized. Pt's insight and judgment are impaired. He is actively psychotic. Pt was pleasant and cooperative throughout assessment. .   Diagnosis: F25.0 Schizoaffective disorder, Bipolar type   Past Medical History:  Past Medical History:  Diagnosis Date  . Depression   . Hypertension   . Schizoaffective disorder (HCC)   . Schizophrenia [facility](HCC)     Past Surgical History:  Procedure Laterality Date  . BACK SURGERY      Family History:  Family History  Problem Relation Age of Onset  . Allergies Mother   . Asthma Mother     Social History:  reports that he has been smoking cigarettes and pipe.  He has a 8.00 pack-year smoking history. He uses smokeless tobacco. He reports that he does not drink alcohol or use drugs.  Additional Social History:  Alcohol / Drug Use Pain Medications: None Prescriptions: See MAR Over the Counter: See MAR History of alcohol / drug use?: Yes Longest period of sobriety (when/how long): "Four years" Negative Consequences of Use: (Pt denies) Withdrawal Symptoms: (Pt denies) Substance #1 Name of Substance 1: Alcohol 1 - Age of First Use: unknown 1 -  Amount (size/oz): unknown 1 - Frequency: unknown 1 - Duration: unknown 1 - Last Use / Amount: unknown Substance #2 Name of Substance 2: Marijuana 2 - Age of First Use: teens 2 - Amount (size/oz): Varied 2 - Frequency: Daily 2 - Duration: Several years 2 - Last Use / Amount: about a year ago Substance #3 Name of Substance 3: Cocaine 3 - Age of First Use:  unknown 3 - Amount (size/oz): unknown 3 - Frequency: unknown 3 - Duration: unknown 3 - Last Use / Amount: unknown  CIWA: CIWA-Ar BP: (!) 155/114 Pulse Rate: (!) 106 COWS:    Allergies: No Known Allergies  Home Medications:  (Not in a hospital admission)  OB/GYN Status:  No LMP for male patient.  General Assessment Data Location of Assessment: [facility] TTS Assessment: In system Is this a Tele or Face-to-Face Assessment?: Tele Assessment Is this an Initial Assessment or a Re-assessment for this encounter?: Initial Assessment Marital status: Single Maiden name: NA Is patient pregnant?: No Pregnancy Status: No Living Arrangements: Non-relatives/Friends(Roommate) Can pt return to current living arrangement?: Yes Admission Status: Involuntary Is patient capable of signing voluntary admission?: Yes Referral Source: Self/Family/Friend Insurance type: Medicare     Crisis Care Plan Living Arrangements: Non-relatives/Friends(Roommate) Legal Guardian: Other:(Self) Name of Psychiatrist: Unknown Name of Therapist: None  Education Status Is patient currently in school?: No Is the patient employed, unemployed or receiving disability?: Receiving disability income  Risk to self with the past 6 months Suicidal Ideation: Yes-Currently Present Has patient been a risk to self within the past 6 months prior to admission? : Yes Suicidal Intent: No Has patient had any suicidal intent within the past 6 months prior to admission? : No Is patient at risk for suicide?: Yes Suicidal Plan?: No Has patient had any suicidal plan within the past 6 months prior to admission? : No Access to Means: No What has been your use of drugs/alcohol within the last 12 months?: Pt reports using alcohol, cocaine and marijuana Previous Attempts/Gestures: No How many times?: 0 Other Self Harm Risks: None Triggers for Past Attempts: None known Intentional Self Injurious Behavior: None Family Suicide History:  Unknown Recent stressful life event(s): Conflict (Comment)(Conflict with mother) Persecutory voices/beliefs?: Yes Depression: Yes Depression Symptoms: Insomnia, Isolating, Loss of interest in usual pleasures, Feeling angry/irritable Substance abuse history and/or treatment for substance abuse?: Yes Suicide prevention information given to non-admitted patients: Not applicable  Risk to Others within the past 6 months Homicidal Ideation: No Does patient have any lifetime risk of violence toward others beyond the six months prior to admission? : No Thoughts of Harm to Others: No Current Homicidal Intent: No Current Homicidal Plan: No Access to Homicidal Means: No Identified Victim: None History of harm to others?: No Assessment of Violence: On admission Violent Behavior Description: Pt's mother reports Pt has been aggressive Does patient have access to weapons?: No Criminal Charges Pending?: No Does patient have a court date: No Is patient on probation?: No  Psychosis Hallucinations: Auditory, Visual Delusions: Unspecified  Mental Status Report Appearance/Hygiene: In scrubs Eye Contact: Good Motor Activity: Unremarkable Speech: Tangential Level of Consciousness: Alert Mood: Anxious, Preoccupied Affect: Preoccupied Anxiety Level: Minimal Thought Processes: Tangential, Thought Blocking Judgement: Impaired Orientation: Person, Place, Time, Situation Obsessive Compulsive Thoughts/Behaviors: None  Cognitive Functioning Concentration: Decreased Memory: Recent Intact, Remote Intact Is patient IDD: No Is patient DD?: No Insight: Poor Impulse Control: Poor Appetite: Fair Have you had any weight changes? : No Change Sleep: Decreased Total Hours of Sleep:  5 Vegetative Symptoms: None  ADLScreening [facility] Assessment Services) Patient's cognitive ability adequate to safely complete daily activities?: Yes Patient able to express need for assistance with ADLs?: Yes Independently  performs ADLs?: Yes (appropriate for developmental age)  Prior Inpatient Therapy Prior Inpatient Therapy: Yes Prior Therapy Dates: Multiple admits Prior Therapy Facilty/Provider(s): [facility], mulitple facilities Reason for Treatment: Schizoaffective disorder  Prior Outpatient Therapy Prior Outpatient Therapy: Yes Prior Therapy Dates: Current Prior Therapy Facilty/Provider(s): Unknown Reason for Treatment: Schizoaffective Does patient have an ACCT team?: No Does patient have Intensive In-House Services?  : No Does patient have [facility] services? : No Does patient have P4CC services?: No  ADL Screening (condition at time of admission) Patient's cognitive ability adequate to safely complete daily activities?: Yes Is the patient deaf or have difficulty hearing?: No Does the patient have difficulty seeing, even when wearing glasses/contacts?: No Does the patient have difficulty concentrating, remembering, or making decisions?: Yes Patient able to express need for assistance with ADLs?: Yes Does the patient have difficulty dressing or bathing?: No Independently performs ADLs?: Yes (appropriate for developmental age) Does the patient have difficulty walking or climbing stairs?: No Weakness of Legs: None Weakness of Arms/Hands: None       Abuse/Neglect Assessment (Assessment to be complete while patient is alone) Abuse/Neglect Assessment Can Be Completed: Yes Physical Abuse: Denies Verbal Abuse: Denies Sexual Abuse: Denies Exploitation of patient/patient's resources: Denies Self-Neglect: Denies     Merchant navy officer (For Healthcare) Does Patient Have a Medical Advance Directive?: No Would patient like information on creating a medical advance directive?: No - Patient declined          Disposition: [facility]innie Rail, [facility], confirmed adult unit is currently at capacity. Gave clinical report to Donell Sievert, PA who said Pt meets criteria for inpatient psychiatric  treatment. TTS will contact other facilities for placement. Notified [address], PA-C and [employer], Charity fundraiser of recommendation.  Disposition Initial Assessment Completed for this Encounter: Yes  This service was provided via telemedicine using a 2-way, interactive audio and video technology.  Names of all persons participating in this telemedicine service and their role in this encounter. Name: Luberta Robertson Role: Patient  Name: Shela Commons, [place] Role: TTS counselor         Harlin Rain Patsy [place], [facility], [facility], [facility] Triage Specialist [phone]  Pamalee Leyden 11/15/2017 11:16 PM

## 2017-11-15 NOTE — ED Provider Notes (Signed)
[facility]-EMERGENCY DEPT Provider Note   CSN: [id number] Arrival date & time: 11/15/17  2036     History   Chief Complaint Chief Complaint  Patient presents with  . Suicidal    HPI Toran Dyanne IhaBernardino is a 32 y.o. male with a PMHx of depression, schizophrenia/schizoaffective disorder, and HTN, who presents to the ED via [facility] under IVC, accompanied by his mother who is the one who took out the IVC paperwork, and assists with history.  Per IVC: " A danger to self and others, to wit: Has schizoaffective disorder and is taking meds but seem to be doing no good; states to petitioner that he wants to end his life; depressed; believe he is having auditory hallucinations because talks to someone all the time and there is no one there; petitioner believes he is using illicit drugs; has been aggressive toward petitioner."  Patient states that he is having SI without a specific plan.  He admits to having auditory hallucinations and when asked what these are, he states "mischievous work".  He also reports having visual hallucinations, stating he sees "dreams I've had before".  He does not further elaborate on either of these topics.  Patient's mother states that he seems to be talking to people who are not present quite frequently, and she is very concerned with his psychiatric state.  He initially denies illicit drug use however his mother states that he is using cocaine and marijuana, last use was 1 week ago.  He admits to being a cigarette smoker.  He denies HI or alcohol use.  He gets fluphenazine shots for his psychiatric condition, every 2 weeks; last received it one week ago.  He is supposed to be on other medications however he isn't taking any of them; his mother isn't sure what the medications are that he's supposed to be on.  He was on BP meds in the past but she's not sure what it was; per chart review, in 2015 he had amlodipine 2.5mg  daily listed as a home medication; mother states  he's not taking any oral meds, he's only getting his injection medication.  Pt denies any medical complaints at this time, and is here under IVC.    The history is provided by the patient, medical records and a parent. No language interpreter was used.    Past Medical History:  Diagnosis Date  . Depression   . Hypertension   . Schizoaffective disorder (HCC)   . Schizophrenia [facility](HCC)     Patient Active Problem List   Diagnosis Date Noted  . Schizophrenia, paranoid, chronic (HCC) 06/08/2015  . Schizoaffective disorder, bipolar type (HCC) 10/21/2014  . Tobacco use disorder 10/21/2014  . Cocaine use disorder, moderate, dependence (HCC) 10/21/2014  . Cannabis use disorder, moderate, dependence (HCC) 10/21/2014    Past Surgical History:  Procedure Laterality Date  . BACK SURGERY          Home Medications    Prior to Admission medications   Medication Sig Start Date End Date Taking? Authorizing Provider  divalproex (DEPAKOTE) 250 MG DR tablet Take 2 tablets (500 mg total) by mouth at bedtime. Patient not taking: Reported on 09/21/2016 12/28/15   Beau FannyWithrow, John C, FNP  fluPHENAZine decanoate (PROLIXIN) 25 MG/ML injection Inject into the muscle every 14 (fourteen) days.  05/16/15   [provider]  naproxen (NAPROSYN) 500 MG tablet Take 1 tablet (500 mg total) by mouth 2 (two) times daily. Patient not taking: Reported on 09/21/2016 12/29/15  Antony Madura, PA-C  traZODone (DESYREL) 100 MG tablet Take 1 tablet (100 mg total) by mouth at bedtime as needed for sleep. Patient not taking: Reported on 09/21/2016 12/28/15   Beau Fanny, FNP    Family History Family History  Problem Relation Age of Onset  . Allergies Mother   . Asthma Mother     Social History Social History   Tobacco Use  . Smoking status: Current Every Day Smoker    Packs/day: 1.00    Years: 8.00    Pack years: 8.00    Types: Cigarettes, Pipe  . Smokeless tobacco: Current User  Substance Use Topics  .  Alcohol use: No  . Drug use: No    Comment: Previous drug use hx     Allergies   Patient has no known allergies.   Review of Systems Review of Systems  Constitutional: Negative for chills and fever.  Respiratory: Negative for shortness of breath.   Cardiovascular: Negative for chest pain.  Gastrointestinal: Negative for abdominal pain, constipation, diarrhea, nausea and vomiting.  Genitourinary: Negative for dysuria and hematuria.  Musculoskeletal: Negative for arthralgias and myalgias.  Skin: Negative for color change.  Allergic/Immunologic: Negative for immunocompromised state.  Neurological: Negative for weakness and numbness.  Psychiatric/Behavioral: Positive for agitation, hallucinations and suicidal ideas.   All other systems reviewed and are negative for acute change except as noted in the HPI.    Physical Exam Updated Vital Signs BP (!) 155/114 (BP Location: Left Arm)   Pulse (!) 106   Temp 98.9 F (37.2 C) (Oral)   Resp 18   Ht 5\' 10"  (1.778 m)   Wt 107 kg (236 lb)   SpO2 100%   BMI 33.86 kg/m   Physical Exam  Constitutional: He is oriented to person, place, and time. Vital signs are normal. He appears well-developed and well-nourished.  Non-toxic appearance. No distress.  Afebrile, nontoxic, NAD although occasionally gets agitated at staff coming into his room but is easily calmed down, mild HTN noted but similar to prior visits.   HENT:  Head: Normocephalic and atraumatic.  Mouth/Throat: Oropharynx is clear and moist and mucous membranes are normal.  Eyes: Conjunctivae and EOM are normal. Right eye exhibits no discharge. Left eye exhibits no discharge.  Neck: Normal range of motion. Neck supple.  Cardiovascular: Normal rate, regular rhythm, normal heart sounds and intact distal pulses. Exam reveals no gallop and no friction rub.  No murmur heard. Initially tachycardic however improved on exam  Pulmonary/Chest: Effort normal and breath sounds normal. No  respiratory distress. He has no decreased breath sounds. He has no wheezes. He has no rhonchi. He has no rales.  Abdominal: Soft. Normal appearance and bowel sounds are normal. He exhibits no distension. There is no tenderness. There is no rigidity, no rebound, no guarding, no CVA tenderness, no tenderness at McBurney's point and negative Murphy's sign.  Musculoskeletal: Normal range of motion.  Neurological: He is alert and oriented to person, place, and time. He has normal strength. No sensory deficit.  Skin: Skin is warm, dry and intact. No rash noted.  Psychiatric: His affect is angry. His speech is tangential. He is actively hallucinating. He exhibits a depressed mood. He expresses suicidal ideation. He expresses no homicidal ideation. He expresses no suicidal plans and no homicidal plans.  Somewhat depressed affect, mostly pleasant and cooperative however seems distracted at times, tangential at times but easily redirected. Occasionally gets angry/agitated at staff coming into room but he's easily calmed down.  Endorsing SI without a plan, denies HI, reports AVH, doesn't seem to be responding to internal stimuli although it's hard to tell since he is tangential/distracted.  He is inattentive.  Nursing note and vitals reviewed.    ED Treatments / Results  Labs (all labs ordered are listed, but only abnormal results are displayed) Labs Reviewed  COMPREHENSIVE METABOLIC PANEL - Abnormal; Notable for the following components:      Result Value   Glucose, Bld 162 (*)    AST 45 (*)    ALT 50 (*)    Total Bilirubin 0.1 (*)    All other components within normal limits  ACETAMINOPHEN LEVEL - Abnormal; Notable for the following components:   Acetaminophen (Tylenol), Serum <10 (*)    All other components within normal limits  CBC - Abnormal; Notable for the following components:   WBC 12.1 (*)    All other components within normal limits  ETHANOL  SALICYLATE LEVEL  RAPID URINE DRUG SCREEN,  HOSP PERFORMED    EKG None  Radiology No results found.  Procedures Procedures (including critical care time)  Medications Ordered in ED Medications  amLODipine (NORVASC) tablet 2.5 mg (has no administration in time range)  ibuprofen (ADVIL,MOTRIN) tablet 600 mg (has no administration in time range)  zolpidem (AMBIEN) tablet 5 mg (has no administration in time range)  ondansetron (ZOFRAN) tablet 4 mg (has no administration in time range)  alum & mag hydroxide-simeth (MAALOX/MYLANTA) 200-200-20 MG/5ML suspension 30 mL (has no administration in time range)  nicotine (NICODERM CQ - dosed in mg/24 hours) patch 21 mg (has no administration in time range)     Initial Impression / Assessment and Plan / ED Course  I have reviewed the triage vital signs and the nursing notes.  Pertinent labs & imaging results that were available during my care of the patient were reviewed by me and considered in my medical decision making (see chart for details).     32 y.o. male here under IVC for SI and hallucinations. Pt endorses SI without a plan, reports AVH, admits to illicit drug use cocaine and THC in the last week, denies EtOH use or HI. +Tobacco user, smoking cessation advised. Has no medical complaints. On exam, pt appears distracted and somewhat tangential at times, but easily redirected; gets upset at staff coming into his room but is easily calmed down. Mildly tachycardic initially but this improved on exam; mild HTN noted but similar to prior visits. Otherwise exam is benign. Will get psych clearance labs and reassess shortly.   10:19 PM CBC with marginally elevated WBC 12.1 but otherwise unremarkable. CMP with hyperglycemia gluc 162 similar to prior visits (not on anything for DM, doesn't appear to have had any sugars >200 in the past, doubt we need to start anything at this time, just something to monitor for now); also with marginally elevated AST 45/ALT 50 but otherwise unremarkable on  remainder of CMP. EtOH level undetectable. Salicylate and acetaminophen levels WNL. UDS pending, but does not interfere with med clearance. Pt medically cleared at this time. Psych hold orders orders placed, since pt and family aren't sure of his home meds will hold off on reordering any of those now and defer to psych for this, but will restart amlodipine 2.5mg  daily since this appears to be the BP med he was previously on. Please see TTS notes for further documentation of care/dispo. Pt stable at time of med clearance.     Final Clinical Impressions(s) / ED Diagnoses  Final diagnoses:  Suicidal ideation  Involuntary commitment  Hallucinations  Polysubstance abuse (HCC)  Tobacco user  Medical clearance for psychiatric admission  Hyperglycemia, unspecified  Essential hypertension    ED Discharge Orders    [address], [place], [place] 11/15/17 2220    Arby Barrette, MD 11/22/17 0002

## 2017-11-15 NOTE — ED Notes (Signed)
Pt aggressive towards mother.  Pt made statements to mother that he wants to end his life as per IVC papers.  Pt awake, alert & responsive, no distress noted,  Monitoring for safety, Q 15 min checks in effect.

## 2017-11-16 DIAGNOSIS — I10 Essential (primary) hypertension: Secondary | ICD-10-CM

## 2017-11-16 DIAGNOSIS — R739 Hyperglycemia, unspecified: Secondary | ICD-10-CM | POA: Diagnosis not present

## 2017-11-16 DIAGNOSIS — R45851 Suicidal ideations: Secondary | ICD-10-CM

## 2017-11-16 DIAGNOSIS — F250 Schizoaffective disorder, bipolar type: Secondary | ICD-10-CM | POA: Diagnosis not present

## 2017-11-16 DIAGNOSIS — F1414 Cocaine abuse with cocaine-induced mood disorder: Secondary | ICD-10-CM | POA: Diagnosis not present

## 2017-11-16 DIAGNOSIS — F1910 Other psychoactive substance abuse, uncomplicated: Secondary | ICD-10-CM | POA: Diagnosis not present

## 2017-11-16 DIAGNOSIS — R443 Hallucinations, unspecified: Secondary | ICD-10-CM | POA: Diagnosis not present

## 2017-11-16 MED ORDER — DIVALPROEX SODIUM 500 MG PO DR TAB: 500.0000 mg | DELAYED_RELEASE_TABLET | Freq: Every day | ORAL | Status: DC

## 2017-11-16 MED ORDER — TRAZODONE HCL 100 MG PO TABS: 100.0000 mg | ORAL_TABLET | Freq: Every evening | ORAL | Status: DC | PRN

## 2017-11-16 MED ORDER — DIVALPROEX SODIUM 500 MG PO DR TAB
500.0000 mg | DELAYED_RELEASE_TABLET | Freq: Two times a day (BID) | ORAL | Status: DC
Start: 2017-11-16 — End: 2017-11-16
  Administered 2017-11-16: 500 mg via ORAL
  Filled 2017-11-16: qty 1
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
## 2017-11-16 NOTE — ED Notes (Signed)
Pt discharged safely with ACTT.  Pt was in no distress and denied S/I.  All belongings were returned to patient.
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
## 2017-11-16 NOTE — ED Notes (Signed)
This patient has been given a urine cup and explained the need to supply specimen.  Pt agrees to provide specimen.

## 2017-11-16 NOTE — [facility] Suicide Risk Assessment (Signed)
Suicide Risk Assessment  Discharge Assessment   [facility]   Principal Problem: Cocaine abuse with cocaine-induced mood disorder [facility]HCC) Discharge Diagnoses:  Patient Active Problem List   Diagnosis Date Noted  . Cocaine abuse with cocaine-induced mood disorder (HCC) [F14.14] 11/16/2017    Priority: High  . Schizoaffective disorder, bipolar type (HCC) [F25.0] 10/21/2014    Priority: High  . 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 Tobacco use disorder [F17.200] 10/21/2014  . Cocaine use disorder, moderate, dependence (HCC) [F14.20] 10/21/2014  . Cannabis use disorder, moderate, dependence (HCC) [F12.20] 10/21/2014    Total Time spent with patient: 45 minutes    Musculoskeletal: Strength & Muscle Tone: within normal limits Gait & Station: normal Patient leans: N/A  Psychiatric Specialty Exam: Physical Exam  Nursing note and vitals reviewed. Constitutional: He is oriented to person, place, and time. He appears well-developed and well-nourished.  HENT:  Head: Normocephalic.  Neck: Normal range of motion.  Respiratory: Effort normal.  Musculoskeletal: Normal range of motion.  Neurological: He is alert and oriented to person, place, and time.  Psychiatric: His speech is normal and behavior is normal. Thought content normal. His mood appears anxious. His affect is blunt. Cognition and memory are normal. He expresses impulsivity.    Review of Systems  Constitutional: Positive for malaise/fatigue.  Psychiatric/Behavioral: Positive for substance abuse. The patient is nervous/anxious.   All other systems reviewed and are negative.   Blood pressure 134/88, pulse 80, temperature 98.5 F (36.9 C), temperature source Oral, resp. rate 18, height 5\' 10"  (1.778 m), weight 107 kg (236 lb), SpO2 96 %.Body mass index is 33.86 kg/m.  General Appearance: Casual  Eye Contact:  Good  Speech:  Normal Rate  Volume:  Normal  Mood:  Anxious   Affect:  Blunt  Thought Process:  Coherent and Descriptions of Associations: Intact  Orientation:  Full (Time, Place, and Person)  Thought Content:  WDL and Logical  Suicidal Thoughts:  No  Homicidal Thoughts:  No  Memory:  Immediate;   Good Recent;   Good Remote;   Good  Judgement:  Fair  Insight:  Fair  Psychomotor Activity:  Normal  Concentration:  Concentration: Good and Attention Span: Good  Recall:  Good  Fund of Knowledge:  Fair  Language:  Good  Akathisia:  No  Handed:  Right  AIMS (if indicated):     Assets:  Housing Leisure Time Physical Health Resilience Social Support  ADL's:  Intact  Cognition:  WNL  Sleep:       Mental Status Per Nursing Assessment::   On Admission:   cocaine abuse with aggression  Demographic Factors:  Male  Loss Factors: NA  Historical Factors: Impulsivity  Risk Reduction Factors:   Sense of responsibility to family, Living with another person, especially a relative, Positive social support and Positive therapeutic relationship  Continued Clinical Symptoms:  Anxiety, mild  Cognitive Features That Contribute To Risk:  None    Suicide Risk:  Minimal: No identifiable suicidal ideation.  Patients presenting with no risk factors but with morbid ruminations; may be classified as minimal risk based on the severity of the depressive symptoms    Plan Of Care/Follow-up recommendations:  Activity:  as tolerated Diet:  heart healthy diet  LORD, JAMISON, NP 11/16/2017, 10:23 AM

## 2017-11-16 NOTE — Progress Notes (Signed)
CSW spoke with Salem Casterosemary Bernadino.  Pt chart has legal guardian flag but CSW was unable to find any legal guardianship paperwork.  [name] reports that pt does not have a legal guardian.  [name] was petitioner for IVC and does not want pt to be discharged and feels that he needs to be hospitalized.  Info passed on to provider. Garner NashGregory Jailene Cupit, MSW, LCSW Clinical Social Worker 11/16/2017 11:08 AM

## 2017-11-16 NOTE — Progress Notes (Signed)
CSW spoke with [facility] regarding patient's readiness for discharge. Patient's ACT Team will pick him up at 11:00am today.   Enid Cutterharlotte Shayanna Thatch, LCSW-A Clinical Social Worker [phone]

## 2017-11-27 DIAGNOSIS — R45851 Suicidal ideations: Secondary | ICD-10-CM | POA: Diagnosis not present

## 2017-11-27 DIAGNOSIS — R45850 Homicidal ideations: Secondary | ICD-10-CM | POA: Diagnosis not present

## 2017-11-27 DIAGNOSIS — F250 Schizoaffective disorder, bipolar type: Secondary | ICD-10-CM | POA: Diagnosis not present

## 2017-11-27 DIAGNOSIS — F17210 Nicotine dependence, cigarettes, uncomplicated: Secondary | ICD-10-CM | POA: Diagnosis not present

## 2017-11-27 DIAGNOSIS — Z62810 Personal history of physical and sexual abuse in childhood: Secondary | ICD-10-CM | POA: Diagnosis not present

## 2017-11-27 DIAGNOSIS — K219 Gastro-esophageal reflux disease without esophagitis: Secondary | ICD-10-CM | POA: Diagnosis not present

## 2018-09-27 ENCOUNTER — Inpatient Hospital Stay (HOSPITAL_COMMUNITY)
Admission: RE | Admit: 2018-09-27 | Discharge: 2018-10-02 | DRG: 885 | Disposition: A | Discharge status: Home / Self Care | Payer: Medicare Other | Attending: Psychiatry | Admitting: Psychiatry

## 2018-09-27 ENCOUNTER — Encounter (HOSPITAL_COMMUNITY): Payer: Self-pay | Admitting: Behavioral Health

## 2018-09-27 ENCOUNTER — Other Ambulatory Visit: Payer: Self-pay

## 2018-09-27 DIAGNOSIS — F250 Schizoaffective disorder, bipolar type: Principal | ICD-10-CM | POA: Diagnosis present

## 2018-09-27 DIAGNOSIS — F259 Schizoaffective disorder, unspecified: Secondary | ICD-10-CM

## 2018-09-27 DIAGNOSIS — F17210 Nicotine dependence, cigarettes, uncomplicated: Secondary | ICD-10-CM | POA: Diagnosis present

## 2018-09-27 DIAGNOSIS — G4700 Insomnia, unspecified: Secondary | ICD-10-CM | POA: Diagnosis present

## 2018-09-27 DIAGNOSIS — F419 Anxiety disorder, unspecified: Secondary | ICD-10-CM | POA: Diagnosis present

## 2018-09-27 MED ORDER — FLUPHENAZINE HCL 1 MG PO TABS
1.0000 mg | ORAL_TABLET | Freq: Every day | ORAL | Status: DC
  Administered 2018-09-27: 23:00:00 1 mg via ORAL
  Filled 2018-09-27: qty 1

## 2018-09-27 MED ORDER — MAGNESIUM HYDROXIDE 400 MG/5ML PO SUSP
30.0000 mL | Freq: Every day | ORAL | Status: DC | PRN
  Administered 2018-09-30: 30 mL via ORAL
  Filled 2018-09-27: qty 30

## 2018-09-27 MED ORDER — BENZTROPINE MESYLATE 0.5 MG PO TABS
0.5000 mg | ORAL_TABLET | Freq: Every day | ORAL | Status: DC
  Administered 2018-09-27: 0.5 mg via ORAL
  Filled 2018-09-27: qty 1

## 2018-09-27 MED ORDER — ACETAMINOPHEN 325 MG PO TABS
650.0000 mg | ORAL_TABLET | Freq: Four times a day (QID) | ORAL | Status: DC | PRN
  Administered 2018-09-28 – 2018-10-01 (×4): 650 mg via ORAL
  Filled 2018-09-27: qty 2
  Filled 2018-09-27: qty 1
  Filled 2018-09-27 (×3): qty 2

## 2018-09-27 MED ORDER — ALUM & MAG HYDROXIDE-SIMETH 200-200-20 MG/5ML PO SUSP
30.0000 mL | ORAL | Status: DC | PRN
  Administered 2018-09-27 – 2018-10-02 (×7): 30 mL via ORAL
  Filled 2018-09-27 (×7): qty 30

## 2018-09-27 MED ORDER — MIRTAZAPINE 7.5 MG PO TABS
7.5000 mg | ORAL_TABLET | Freq: Every day | ORAL | Status: DC
  Administered 2018-09-27: 7.5 mg via ORAL
  Filled 2018-09-27: qty 1

## 2018-09-27 MED ORDER — HYDROXYZINE HCL 25 MG PO TABS
25.0000 mg | ORAL_TABLET | Freq: Three times a day (TID) | ORAL | Status: DC | PRN
  Administered 2018-09-30: 25 mg via ORAL
  Filled 2018-09-27: qty 1

## 2018-09-27 NOTE — Progress Notes (Signed)
Patient ID: [name], male   DOB: 1985/08/20, 33 y.o.   MRN: [id number] D: Patient arrived to [facility] Adult unit as a voluntary patient. He hopes to restart his prolixin LAI, as he has been in jail for the past month and off of his medications. He has a history of schizophrenia and depression. During admission he provides almost no information to RN. He refused to answer any questions except that he denied SI/HI/AVH. However, on the unit, he has been responding to internal stimuli. He laughs loudly in his room and carries on long detailed conversations when no one is present.  A: Skin assessment performed per protocol, no contraband noted, and revealed localized papules across his shoulders and chest. Patient belongings placed in sealed bag and locked up. Unit orientation completed. Care plan and unit routines reviewed with patient, understanding verbalized. Emotional support offered to patient. Encouraged patient to voice concerns. Fluids offered to patient. Q15 minute checks initiated for safety on and off unit.  R: Patient conversing with self in room.

## 2018-09-27 NOTE — BH Assessment (Signed)
Assessment Note  [name] is a 33 y.o. male who presented to [facility] as a voluntary walk-in with complaint of ''stress,'' auditory and visual hallucination, and other symptoms.  Pt was last assessed by TTS in 2019.  Pt has a diagnosis of Schizoaffective Disorder, he is on disability, and he lives with roommates in [place].  Pt receives outpatient psychiatric services, but he cannot recall the name of the provider.  Pt was recently released from prison.  He is on probation arising from [place] and resisting an Medical illustrator.    Pt reported that he feels ''stressed'' because ''everyone wants to bother me.''  ''I'm very popular.''  Pt reported that he feels pressured and overwhelmed by ''things I can't remember.''  Pt endorsed ongoing auditory and visual hallucination.  Pt denied suicidal ideation, homicidal ideation, self-injurious behavior. When asked about drug use, Pt stated that he is not using drugs, but then he asked for assistance in staying off of drugs.  When asked what drugs he stopped taking, he stated ''All of them'' (and could not elaborate).    During assessment, Pt presented as alert and oriented.  He had good eye contact.  Pt's demeanor was sedated.  His hygiene was poor -- clothes were disheveled, and Pt had body odor.  Pt's mood was preoccupied, and affect was blunted.  Speech was normal in rate, rhythm, and volume.  Thought processes were within normal range, and thought content was logical and goal-oriented.  There was possible delusion --- Pt repeated several times that he was popular and that people were bothering him.  He could not elaborate.  His memory was poor.  Concentration was fair.  Insight, judgment, and impulse control were fair.  Consulted with Myrle Sheng, NP, who also met with Pt.  Pt admitted to OBS.  Diagnosis: Schizoaffective Disorder  Past Medical History:  Past Medical History:  Diagnosis Date  . Depression   . Hypertension   . Schizoaffective disorder ([place])    . Schizophrenia [facility])     Past Surgical History:  Procedure Laterality Date  . BACK SURGERY      Family History:  Family History  Problem Relation Age of Onset  . Allergies Mother   . Asthma Mother     Social History:  reports that he has been smoking cigarettes and pipe. He has a 8.00 pack-year smoking history. He uses smokeless tobacco. He reports previous alcohol use. He reports previous drug use.  Additional Social History:  Alcohol / Drug Use Pain Medications: See MAR Prescriptions: See MAR Over the Counter: See MAR History of alcohol / drug use?: Yes(Pt would not specify past drug use)  CIWA: CIWA-Ar BP: (!) 128/97 Pulse Rate: (!) 108 COWS:    Allergies: No Known Allergies  Home Medications: (Not in a hospital admission)   OB/GYN Status:  No LMP for male patient.  General Assessment Data Location of Assessment: [facility] Assessment Services TTS Assessment: In system Is this a Tele or Face-to-Face Assessment?: Face-to-Face Is this an Initial Assessment or a Re-assessment for this encounter?: Initial Assessment Patient Accompanied by:: N/A Language Other than English: No Living Arrangements: Other (Comment)(Lives with roommates) What gender do you identify as?: Male Marital status: Single Pregnancy Status: No Living Arrangements: Non-relatives/Friends Can pt return to current living arrangement?: Yes Admission Status: Voluntary Is patient capable of signing voluntary admission?: Yes Referral Source: Self/Family/Friend Insurance type: [place] MCD  Medical Screening Exam ([place]) Medical Exam completed: Yes  Crisis Care Plan Living Arrangements: Non-relatives/Friends  Name of Psychiatrist: Cannot recall Name of Therapist: Cannot recall  Education Status Is patient currently in school?: No Is the patient employed, unemployed or receiving disability?: Receiving disability income  Risk to self with the past 6 months Suicidal Ideation: No Has patient  been a risk to self within the past 6 months prior to admission? : No Suicidal Intent: No Has patient had any suicidal intent within the past 6 months prior to admission? : No Is patient at risk for suicide?: No Suicidal Plan?: No Has patient had any suicidal plan within the past 6 months prior to admission? : No Access to Means: No What has been your use of drugs/alcohol within the last 12 months?: Pt could not recall Previous Attempts/Gestures: No Other Self Harm Risks: Does not take all his medication Intentional Self Injurious Behavior: None(Hx of cutting arm, but not for years) Family Suicide History: No Recent stressful life event(s): Other (Comment)(''I'm stressed'') Persecutory voices/beliefs?: No Depression: No Depression Symptoms: Feeling angry/irritable, Feeling worthless/self pity Substance abuse history and/or treatment for substance abuse?: Yes Suicide prevention information given to non-admitted patients: Not applicable  Risk to Others within the past 6 months Homicidal Ideation: No Does patient have any lifetime risk of violence toward others beyond the six months prior to admission? : No Thoughts of Harm to Others: No Current Homicidal Intent: No Current Homicidal Plan: No Access to Homicidal Means: No History of harm to others?: No Assessment of Violence: None Noted Does patient have access to weapons?: No Criminal Charges Pending?: No Does patient have a court date: No Is patient on probation?: Yes  Psychosis Hallucinations: Auditory, Visual Delusions: Unspecified(Possible delusion -- "I'm very popular and people want to be)  Mental Status Report Appearance/Hygiene: Poor hygiene, Other (Comment)(Street clothes) Eye Contact: Good Motor Activity: Freedom of movement, Unremarkable Speech: Logical/coherent Level of Consciousness: Alert Mood: Preoccupied Affect: Appropriate to circumstance Anxiety Level: None Thought Processes: Coherent,  Relevant Judgement: Partial Orientation: Person, Place, Situation, Time Obsessive Compulsive Thoughts/Behaviors: None  Cognitive Functioning Concentration: Normal Memory: Recent Intact, Remote Intact Is patient IDD: No Insight: Fair Impulse Control: Fair Appetite: Good Have you had any weight changes? : No Change Sleep: No Change Total Hours of Sleep: 8 Vegetative Symptoms: None  ADLScreening [facility] Assessment Services) Patient's cognitive ability adequate to safely complete daily activities?: Yes Patient able to express need for assistance with ADLs?: Yes Independently performs ADLs?: Yes (appropriate for developmental age)  Prior Inpatient Therapy Prior Inpatient Therapy: Yes Prior Therapy Dates: 2019 and other Prior Therapy Facilty/Provider(s): [facility] and other Reason for Treatment: Schizophrenia/Schizoaffective  Prior Outpatient Therapy Prior Outpatient Therapy: Yes Prior Therapy Dates: Ongoing Prior Therapy Facilty/Provider(s): Pt cannot recall the name of provider Reason for Treatment: Schizoaffective disorder Does patient have an ACCT team?: No Does patient have Intensive In-House Services?  : No Does patient have [facility] services? : No Does patient have P4CC services?: No  ADL Screening (condition at time of admission) Patient's cognitive ability adequate to safely complete daily activities?: Yes Is the patient deaf or have difficulty hearing?: No Does the patient have difficulty seeing, even when wearing glasses/contacts?: No Does the patient have difficulty concentrating, remembering, or making decisions?: No Patient able to express need for assistance with ADLs?: Yes Does the patient have difficulty dressing or bathing?: No Independently performs ADLs?: Yes (appropriate for developmental age) Does the patient have difficulty walking or climbing stairs?: No Weakness of Legs: None Weakness of Arms/Hands: None  Home Assistive Devices/Equipment Home Assistive  Devices/Equipment: None  Therapy Consults (therapy consults require a physician order) PT Evaluation Needed: No OT Evalulation Needed: No SLP Evaluation Needed: No Abuse/Neglect Assessment (Assessment to be complete while patient is alone) Abuse/Neglect Assessment Can Be Completed: Yes Physical Abuse: Denies Verbal Abuse: Denies Sexual Abuse: Denies Exploitation of patient/patient's resources: Denies Self-Neglect: Denies Values / Beliefs Cultural Requests During Hospitalization: None Spiritual Requests During Hospitalization: None Consults Spiritual Care Consult Needed: No Social Work Consult Needed: No Regulatory affairs officer (For Healthcare) Does Patient Have a Medical Advance Directive?: No          Disposition:  Disposition Initial Assessment Completed for this Encounter: Yes Patient referred to: Other (Comment)(Admitted to [facility] OBS Unit)  On Site Evaluation by:   Reviewed with Physician:    Laurena Slimmer Cary Lothrop 09/27/2018 2:27 PM

## 2018-09-27 NOTE — H&P (Signed)
Behavioral Health Medical Screening Exam  [name] is an 33 y.o. male.[name] 33 year old Caucasian male presents to behavioral health as a walk-in requesting additional "therapy services."  Patient is currently denying suicidal or homicidal ideations.  Reported auditory hallucinations due to being schizophrenic.  Reports " I just need help with I am not sure why?"  Patient reports he takes his medication as prescribed.  States he is followed by act team however was unable to recall last visit by his nurse.  Patient reported paranoia ideations. "  I am really stressed out because everybody follows me and wants all my time and attention."  Patient provided verbal authorization to contact mother [name] for collateral information.    Total Time spent with patient: 15 minutes  Psychiatric Specialty Exam: Physical Exam  ROS  Blood pressure (!) 128/97, pulse (!) 108, temperature 98.7 F (37.1 C), temperature source Oral, resp. rate 18, SpO2 100 %.There is no height or weight on file to calculate BMI.  General Appearance: Bizarre  Eye Contact:  Good  Speech:  Clear and Coherent and Slow  Volume:  Normal  Mood:  Anxious and Depressed  Affect:  Congruent  Thought Process:  Coherent  Orientation:  Full (Time, Place, and Person)  Thought Content:  Paranoid Ideation  Suicidal Thoughts:  No  Homicidal Thoughts:  No  Memory:  Immediate;   Fair Recent;   Fair  Judgement:  Fair  Insight:  Fair  Psychomotor Activity:  Normal  Concentration: Concentration: Fair  Recall:  [employer] of Knowledge:Fair  Language: Fair  Akathisia:  No  Handed:  Right  AIMS (if indicated):     Assets:  Communication Skills Desire for Improvement Resilience Social Support  Sleep:       Musculoskeletal: Strength & Muscle Tone: within normal limits Gait & Station: normal Patient leans: N/A  Blood pressure (!) 128/97, pulse (!) 108, temperature 98.7 F (37.1 C), temperature source Oral, resp.  rate 18, SpO2 100 %.  Recommendations: Patient to be admitted to observation unit Will restart meds where appropriate Labs pending results  Based on my evaluation the patient does not appear to have an emergency medical condition.  Oneta Rack, NP 09/27/2018, 2:57 PM
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
## 2018-09-27 NOTE — Plan of Care (Signed)
[facility] Observation Crisis Plan  Reason for Crisis Plan:  Chronic Mental Illness/Medical Illness, Crisis Stabilization and Medication Management   Plan of Care:  Referral for IOP  Family Support:      Current Living Environment:  Living Arrangements: Non-relatives/Friends  Insurance:   Hospital Account    Name Acct ID Class Status Primary Coverage   [name], [name] [id number] BEHAVIORAL HEALTH OBSERVATION Open MEDICARE - MEDICARE PART A AND B        Guarantor Account (for Hospital Account [id number])    Name Relation to Pt Service Area Active? Acct Type   [name], Riley Self [facility] Yes [facility]   Address Phone       [address] [name], [place] [address] [phone](H)          Coverage Information (for Hospital Account [id number])    1. MEDICARE/MEDICARE PART A AND B    F/O Payor/Plan Precert #   MEDICARE/MEDICARE PART A AND B    Subscriber Subscriber #   [name], [place] [id number]   Address Phone   [address] [place], [place] [address]        2. [place]    F/O Payor/Plan Precert #   [facility] MEDICAID/[place]    Subscriber Subscriber #   [name], [place] [id number] P   Address Phone   [address] [place], [place] [address] [phone]          Legal Guardian:     Primary Care Provider:  Jackie Plum, MD  Current Outpatient Providers:  [name]  Psychiatrist:  Name of Psychiatrist: Cannot recall  Counselor/Therapist:  Name of Therapist: Cannot recall  Compliant with Medications:  No  Additional Information: Patient has been in jail for one month and needs to restart his antipsychotic medication for schizophrenia.   Kirstie Mirza 5/10/20204:47 PM

## 2018-09-28 DIAGNOSIS — G4700 Insomnia, unspecified: Secondary | ICD-10-CM | POA: Diagnosis present

## 2018-09-28 DIAGNOSIS — F419 Anxiety disorder, unspecified: Secondary | ICD-10-CM | POA: Diagnosis present

## 2018-09-28 DIAGNOSIS — F250 Schizoaffective disorder, bipolar type: Secondary | ICD-10-CM | POA: Diagnosis not present

## 2018-09-28 DIAGNOSIS — F17210 Nicotine dependence, cigarettes, uncomplicated: Secondary | ICD-10-CM | POA: Diagnosis present

## 2018-09-28 LAB — CBC
HCT: 48.8 % (ref 39.0–52.0)
Hemoglobin: 16.4 g/dL (ref 13.0–17.0)
MCH: 29.2 pg (ref 26.0–34.0)
MCHC: 33.6 g/dL (ref 30.0–36.0)
MCV: 87 fL (ref 80.0–100.0)
Platelets: 329 10*3/uL (ref 150–400)
RBC: 5.61 MIL/uL (ref 4.22–5.81)
RDW: 12.2 % (ref 11.5–15.5)
WBC: 8.2 10*3/uL (ref 4.0–10.5)
nRBC: 0 % (ref 0.0–0.2)

## 2018-09-28 LAB — RAPID URINE DRUG SCREEN, HOSP PERFORMED
Amphetamines: NOT DETECTED
Barbiturates: NOT DETECTED
Benzodiazepines: NOT DETECTED
Cocaine: NOT DETECTED
Opiates: NOT DETECTED
Tetrahydrocannabinol: NOT DETECTED

## 2018-09-28 LAB — LIPID PANEL
Cholesterol: 192 mg/dL (ref 0–200)
HDL: 37 mg/dL — ABNORMAL LOW (ref >40)
LDL Cholesterol: 131 mg/dL — ABNORMAL HIGH (ref 0–99)
Total CHOL/HDL Ratio: 5.2 RATIO
Triglycerides: 120 mg/dL (ref <150)
VLDL: 24 mg/dL (ref 0–40)

## 2018-09-28 LAB — COMPREHENSIVE METABOLIC PANEL
ALT: 30 U/L (ref 0–44)
AST: 19 U/L (ref 15–41)
Albumin: 4.2 g/dL (ref 3.5–5.0)
Alkaline Phosphatase: 90 U/L (ref 38–126)
Anion gap: 9 (ref 5–15)
BUN: 14 mg/dL (ref 6–20)
CO2: 27 mmol/L (ref 22–32)
Calcium: 9.2 mg/dL (ref 8.9–10.3)
Chloride: 103 mmol/L (ref 98–111)
Creatinine, Ser: 0.93 mg/dL (ref 0.61–1.24)
GFR calc Af Amer: >60 mL/min (ref >60)
GFR calc non Af Amer: >60 mL/min (ref >60)
Glucose, Bld: 136 mg/dL — ABNORMAL HIGH (ref 70–99)
Potassium: 3.8 mmol/L (ref 3.5–5.1)
Sodium: 139 mmol/L (ref 135–145)
Total Bilirubin: 0.5 mg/dL (ref 0.3–1.2)
Total Protein: 7.7 g/dL (ref 6.5–8.1)

## 2018-09-28 LAB — HEMOGLOBIN A1C
Hgb A1c MFr Bld: 6.2 % — ABNORMAL HIGH (ref 4.8–5.6)
Mean Plasma Glucose: 131.24 mg/dL

## 2018-09-28 LAB — TSH: TSH: 2.64 u[IU]/mL (ref 0.350–4.500)

## 2018-09-28 MED ORDER — ZIPRASIDONE MESYLATE 20 MG IM SOLR
INTRAMUSCULAR | Status: AC
  Administered 2018-09-28: 20 mg
  Filled 2018-09-28: qty 20

## 2018-09-28 MED ORDER — LORAZEPAM 2 MG/ML IJ SOLN
INTRAMUSCULAR | Status: AC
  Administered 2018-09-28: 2 mg
  Filled 2018-09-28: qty 1

## 2018-09-28 MED ORDER — FLUPHENAZINE HCL 5 MG PO TABS: 5.0000 mg | ORAL_TABLET | Freq: Three times a day (TID) | ORAL | 0 refills | Status: DC

## 2018-09-28 MED ORDER — OLANZAPINE 10 MG PO TBDP
10.0000 mg | ORAL_TABLET | Freq: Once | ORAL | Status: DC
  Filled 2018-09-28: qty 1

## 2018-09-28 MED ORDER — LORAZEPAM 1 MG PO TABS: 2.0000 mg | ORAL_TABLET | Freq: Once | ORAL | Status: DC

## 2018-09-28 MED ORDER — BENZTROPINE MESYLATE 1 MG PO TABS
1.0000 mg | ORAL_TABLET | Freq: Two times a day (BID) | ORAL | Status: DC
  Administered 2018-09-28 – 2018-10-02 (×7): 1 mg via ORAL
  Filled 2018-09-28 (×11): qty 1

## 2018-09-28 MED ORDER — BENZTROPINE MESYLATE 1 MG PO TABS: 1.0000 mg | ORAL_TABLET | Freq: Two times a day (BID) | ORAL | 0 refills | Status: DC

## 2018-09-28 MED ORDER — LORAZEPAM 2 MG/ML IJ SOLN
INTRAMUSCULAR | Status: AC
  Administered 2018-09-28: 11:00:00 2 mg
  Filled 2018-09-28: qty 1

## 2018-09-28 MED ORDER — FLUPHENAZINE HCL 5 MG PO TABS
5.0000 mg | ORAL_TABLET | Freq: Three times a day (TID) | ORAL | Status: DC
  Administered 2018-09-28 – 2018-09-29 (×2): 5 mg via ORAL
  Filled 2018-09-28 (×6): qty 1

## 2018-09-28 MED ORDER — TEMAZEPAM 30 MG PO CAPS
30.0000 mg | ORAL_CAPSULE | Freq: Every day | ORAL | Status: DC
  Administered 2018-09-28 – 2018-09-30 (×3): 30 mg via ORAL
  Filled 2018-09-28 (×2): qty 1

## 2018-09-28 MED ORDER — DIPHENHYDRAMINE HCL 50 MG PO CAPS
50.0000 mg | ORAL_CAPSULE | Freq: Once | ORAL | Status: DC
  Filled 2018-09-28: qty 2

## 2018-09-28 MED ORDER — ZIPRASIDONE MESYLATE 20 MG IM SOLR
20.0000 mg | Freq: Once | INTRAMUSCULAR | Status: AC
  Administered 2018-09-28: 20 mg via INTRAMUSCULAR

## 2018-09-28 MED ORDER — TEMAZEPAM 30 MG PO CAPS: 30.0000 mg | ORAL_CAPSULE | Freq: Every day | ORAL | 0 refills | Status: DC

## 2018-09-28 MED ORDER — HYDROXYZINE HCL 25 MG PO TABS: 25.0000 mg | ORAL_TABLET | Freq: Three times a day (TID) | ORAL | 0 refills | Status: DC | PRN

## 2018-09-28 NOTE — BH Assessment (Signed)
[facility] Assessment Progress Note  Per Juanetta Beets, DO, this pt requires psychiatric hospitalization at this time.  Berneice Heinrich, RN, [facility] has assigned pt to [facility] Rm 506-2.  This pt is currently under voluntary status.  Pt's nurse, Lincoln Maxin, has been notified.  This Clinical research associate has also attempted to reach [place] with registration, to ask her to have pt sign consents.  I will continue to reach out to her.  Doylene Canning, [place] Behavioral Health Coordinator [phone]

## 2018-09-28 NOTE — Progress Notes (Signed)
Pt A & O X3. Presents very irritable, suspicious and paranoid on interactions. Observed to be responding to internal stimuli. Pt was seen talking loudly, laughing inappropriately to unseen others. Pt was verbally abusive and threatening to staff "you fucking nigger, don't come fucking near me". Verbal redirections was ineffective at the time. New order received for PRN Ativan, Benadryl and Geodon; medications administered per provider's orders (See EMAR). Pt visible in hall at this time.
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
## 2018-09-28 NOTE — Progress Notes (Signed)
Pt transferred to [facility] ([address]) per order. Skin assessment done and belongings searched per protocol. Small scab noted on left foot. Items deemed contraband secured in locker. Pt ambulatory with a steady gait, accompanied to 500 with [facility] staff and was cooperative with care. Q 15 minutes safety checks maintained till time of transfer without self harm gestures to note.

## 2018-09-28 NOTE — H&P (Signed)
Psychiatric Admission Assessment Adult  Patient Identification: [name] MRN:  [id number] Date of Evaluation:  09/28/2018 Chief Complaint:  Schizoaffective Principal Diagnosis: Schizoaffective bipolar type acute exacerbation Diagnosis:  Active Problems:   Schizo affective schizophrenia (HCC)  History of Present Illness:  Mr. Esselman is well-known to the examiner he is 32 years of age he is followed by strategic interventions act team but he had recently been incarcerated and there had been a lapse in his scheduled Prolixin decanoate medication.  This led to an exacerbation in his condition and he presented on a voluntary basis on 5/10 with a cluster of vague complaints but it was clear over time that he was actively hallucinating and he is in need of inpatient stabilization.  Again, when the patient is drug-free that is no illicit compounds and taking his Prolixin he is generally stable however again recent incarceration disrupted the pattern of needed long-acting injectable medications despite act team attempts to communicate with jail personnel and make sure he did not labs.  When I interviewed the patient he states he is hearing things but states "that is just part of my illness-I have that" So he minimizes the fact that he is actively hallucinating but was described as having conversations with no one in the room earlier in the admission process. He acknowledged visual hallucinations but would not elaborate what content might be  He denied wanting to harm self or others and could contract fully No EPS or TD.   Associated Signs/Symptoms: Depression Symptoms:  insomnia, psychomotor agitation, (Hypo) Manic Symptoms:  Delusions, Flight of Ideas, Grandiosity, Hallucinations, Anxiety Symptoms:  n/a Psychotic Symptoms:  Hallucinations: Auditory Visual PTSD Symptoms: NA Total Time spent with patient: 45 minutes  Past Psychiatric History: long term schizoaff  Is the patient  at risk to self? Yes.    Has the patient been a risk to self in the past 6 months? No.  Has the patient been a risk to self within the distant past? No.  Is the patient a risk to others? No.  Has the patient been a risk to others in the past 6 months? No.  Has the patient been a risk to others within the distant past? No.   Prior Inpatient Therapy: Prior Inpatient Therapy: Yes Prior Therapy Dates: 2019 and other Prior Therapy Facilty/Provider(s): [facility] and other Reason for Treatment: Schizophrenia/Schizoaffective Prior Outpatient Therapy: Prior Outpatient Therapy: Yes Prior Therapy Dates: Ongoing Prior Therapy Facilty/Provider(s): Pt cannot recall the name of provider Reason for Treatment: Schizoaffective disorder Does patient have an ACCT team?: No Does patient have Intensive In-House Services?  : No Does patient have [facility] services? : No Does patient have P4CC services?: No  Alcohol Screening:   Substance Abuse History in the last 12 months:  Yes.   Consequences of Substance Abuse: NA Previous Psychotropic Medications: Yes  Psychological Evaluations: No  Past Medical History:  Past Medical History:  Diagnosis Date  . Depression   . Hypertension   . Schizoaffective disorder (HCC)   . Schizophrenia [facility])     Past Surgical History:  Procedure Laterality Date  . BACK SURGERY     Family History:  Family History  Problem Relation Age of Onset  . Allergies Mother   . Asthma Mother    Family Psychiatric  History: neg Tobacco Screening:   Social History:  Social History   Substance and Sexual Activity  Alcohol Use Not Currently     Social History   Substance and Sexual Activity  Drug Use  Not Currently   Comment: Previous drug use hx -- would not specify    Additional Social History: Marital status: Single    Pain Medications: See MAR Prescriptions: See MAR Over the Counter: See MAR History of alcohol / drug use?: Yes(Pt would not specify past drug use)                     Allergies:  No Known Allergies Lab Results:  Results for orders placed or performed during the hospital encounter of 09/27/18 (from the past 48 hour(s))  Urine rapid drug screen (hosp performed)not at [facility]     Status: None   Collection Time: 09/27/18  2:36 PM  Result Value Ref Range   Opiates NONE DETECTED NONE DETECTED   Cocaine NONE DETECTED NONE DETECTED   Benzodiazepines NONE DETECTED NONE DETECTED   Amphetamines NONE DETECTED NONE DETECTED   Tetrahydrocannabinol NONE DETECTED NONE DETECTED   Barbiturates NONE DETECTED NONE DETECTED    Comment: (NOTE) DRUG SCREEN FOR MEDICAL PURPOSES ONLY.  IF CONFIRMATION IS NEEDED FOR ANY PURPOSE, NOTIFY LAB WITHIN 5 DAYS. LOWEST DETECTABLE LIMITS FOR URINE DRUG SCREEN Drug Class                     Cutoff (ng/mL) Amphetamine and metabolites    1000 Barbiturate and metabolites    200 Benzodiazepine                 200 Tricyclics and metabolites     300 Opiates and metabolites        300 Cocaine and metabolites        300 THC                            50 Performed at [facility], [address] [place], [place] [address]   CBC     Status: None   Collection Time: 09/28/18  7:03 AM  Result Value Ref Range   WBC 8.2 4.0 - 10.5 K/uL   RBC 5.61 4.22 - 5.81 MIL/uL   Hemoglobin 16.4 13.0 - 17.0 g/dL   HCT 81.8 56.3 - 14.9 %   MCV 87.0 80.0 - 100.0 fL   MCH 29.2 26.0 - 34.0 pg   MCHC 33.6 30.0 - 36.0 g/dL   RDW 70.2 63.7 - 85.8 %   Platelets 329 150 - 400 K/uL   nRBC 0.0 0.0 - 0.2 %    Comment: Performed at [facility], [address] [place], [place] [address]  Comprehensive metabolic panel     Status: Abnormal   Collection Time: 09/28/18  7:03 AM  Result Value Ref Range   Sodium 139 135 - 145 mmol/L   Potassium 3.8 3.5 - 5.1 mmol/L   Chloride 103 98 - 111 mmol/L   CO2 27 22 - 32 mmol/L   Glucose, Bld 136 (H) 70 - 99 mg/dL   BUN 14 6 - 20 mg/dL   Creatinine, Ser 7.41  0.61 - 1.24 mg/dL   Calcium 9.2 8.9 - 28.7 mg/dL   Total Protein 7.7 6.5 - 8.1 g/dL   Albumin 4.2 3.5 - 5.0 g/dL   AST 19 15 - 41 U/L   ALT 30 0 - 44 U/L   Alkaline Phosphatase 90 38 - 126 U/L   Total Bilirubin 0.5 0.3 - 1.2 mg/dL   GFR calc non Af Amer >60 >60 mL/min   GFR calc Af  Amer >60 >60 mL/min   Anion gap 9 5 - 15    Comment: Performed at [facility], [address] [place], [place] [address]  Lipid panel     Status: Abnormal   Collection Time: 09/28/18  7:03 AM  Result Value Ref Range   Cholesterol 192 0 - 200 mg/dL   Triglycerides 604 <540 mg/dL   HDL 37 (L) >98 mg/dL   Total CHOL/HDL Ratio 5.2 RATIO   VLDL 24 0 - 40 mg/dL   LDL Cholesterol 119 (H) 0 - 99 mg/dL    Comment:        Total Cholesterol/HDL:CHD Risk Coronary Heart Disease Risk Table                     Men   Women  1/2 Average Risk   3.4   3.3  Average Risk       5.0   4.4  2 X Average Risk   9.6   7.1  3 X Average Risk  23.4   11.0        Use the calculated Patient Ratio above and the CHD Risk Table to determine the patient's CHD Risk.        ATP III CLASSIFICATION (LDL):  <100     mg/dL   Optimal  147-829  mg/dL   Near or Above                    Optimal  130-159  mg/dL   Borderline  562-130  mg/dL   High  >865     mg/dL   Very High Performed at [facility], [address] [place], [place] [address]   Hemoglobin A1c     Status: Abnormal   Collection Time: 09/28/18  7:03 AM  Result Value Ref Range   Hgb A1c MFr Bld 6.2 (H) 4.8 - 5.6 %    Comment: (NOTE) Pre diabetes:          5.7%-6.4% Diabetes:              >6.4% Glycemic control for   <7.0% adults with diabetes    Mean Plasma Glucose 131.24 mg/dL    Comment: Performed at [facility], [address] [place], [place] [address]  TSH     Status: None   Collection Time: 09/28/18  7:03 AM  Result Value Ref Range   TSH 2.640 0.350 - 4.500 uIU/mL    Comment: Performed by a 3rd Generation assay  with a functional sensitivity of <=0.01 uIU/mL. Performed at [facility], [address] [place], [place] [address]     Blood Alcohol level:  Lab Results  Component Value Date   ETH <10 11/15/2017   ETH <5 12/27/2015    Metabolic Disorder Labs:  Lab Results  Component Value Date   HGBA1C 6.2 (H) 09/28/2018   MPG 131.24 09/28/2018   Lab Results  Component Value Date   PROLACTIN 23.0 (H) 06/10/2015   Lab Results  Component Value Date   CHOL 192 09/28/2018   TRIG 120 09/28/2018   HDL 37 (L) 09/28/2018   CHOLHDL 5.2 09/28/2018   VLDL 24 09/28/2018   LDLCALC 131 (H) 09/28/2018   LDLCALC 119 (H) 06/10/2015    Current Medications: Current Facility-Administered Medications  Medication Dose Route Frequency Provider Last Rate Last Dose  . acetaminophen (TYLENOL) tablet 650 mg  650 mg Oral Q6H PRN Oneta Rack, NP      .  alum & mag hydroxide-simeth (MAALOX/MYLANTA) 200-200-20 MG/5ML suspension 30 mL  30 mL Oral Q4H PRN Oneta RackLewis, Tanika N, NP   30 mL at 09/27/18 2054  . benztropine (COGENTIN) tablet 1 mg  1 mg Oral BID Malvin JohnsFarah, Basha Krygier, MD      . diphenhydrAMINE (BENADRYL) capsule 50 mg  50 mg Oral Once Charm RingsLord, Jamison Y, NP      . fluPHENAZine (PROLIXIN) tablet 5 mg  5 mg Oral TID Malvin JohnsFarah, Akshaj Besancon, MD      . hydrOXYzine (ATARAX/VISTARIL) tablet 25 mg  25 mg Oral TID PRN Oneta RackLewis, Tanika N, NP      . LORazepam (ATIVAN) tablet 2 mg  2 mg Oral Once Lord, Jamison Y, NP      . magnesium hydroxide (MILK OF MAGNESIA) suspension 30 mL  30 mL Oral Daily PRN Oneta RackLewis, Tanika N, NP      . mirtazapine (REMERON) tablet 7.5 mg  7.5 mg Oral QHS Oneta RackLewis, Tanika N, NP   7.5 mg at 09/27/18 2305  . temazepam (RESTORIL) capsule 30 mg  30 mg Oral QHS Malvin JohnsFarah, Aleea Hendry, MD       PTA Medications: Medications Prior to Admission  Medication Sig Dispense Refill Last Dose  . fluPHENAZine decanoate (PROLIXIN) 25 MG/ML injection Inject into the muscle every 14 (fourteen) days.    Not Taking at Unknown time   Musculoskeletal: Strength & Muscle Tone: within normal limits Gait & Station: normal Patient leans: N/A  Psychiatric Specialty Exam: Physical Exam no EPS or TD vital stable  ROS illogical denies head trauma or seizures/cardiovascular denies cardiovascular symptoms/GI/GU no complaints  Blood pressure 132/86, pulse 81, temperature 98.3 F (36.8 C), resp. rate 18, SpO2 100 %.There is no height or weight on file to calculate BMI.  General Appearance: Casual  Eye Contact:  Fair  Speech:  Pressured and disjointed statements  Volume:  Decreased  Mood:  Dysphoric  Affect:  Non-Congruent and Inappropriate  Thought Process:  Disorganized, Irrelevant and Descriptions of Associations: Loose  Orientation:  Other:  person place situation  Thought Content:  Illogical and Hallucinations: Auditory Visual  Suicidal Thoughts:  No  Homicidal Thoughts:  No  Memory:  Immediate;   Poor  Judgement:  Impaired  Insight:  Lacking  Psychomotor Activity:  Decreased  Concentration:  Concentration: Poor  Recall:  Poor  Fund of Knowledge:  Fair  Language:  Fair  Akathisia:  Negative  Handed:  Right  AIMS (if indicated):     Assets:  Leisure Time Physical Health Resilience  ADL's:  Intact  Cognition:  WNL  Sleep:        Treatment Plan Summary: Daily contact with patient to assess and evaluate symptoms and progress in treatment and Medication management  Observation Level/Precautions:  15 minute checks  Laboratory:  UDS  Psychotherapy: Cognitive and reality based  Medications: Resume Prolixin  Consultations: Not necessary  Discharge Concerns: Long-term stability  Estimated LOS: 7-10  Other: Axis I-schizoaffective bipolar type, history of polysubstance abuse negative drug screen   Physician Treatment Plan for Primary Diagnosis: <principal problem not specified> Long Term Goal(s): Improvement in symptoms so as ready for discharge  Short Term Goals: Ability to verbalize feelings will  improve  Physician Treatment Plan for Secondary Diagnosis: Active Problems:   Schizo affective schizophrenia (HCC)  Long Term Goal(s): Improvement in symptoms so as ready for discharge  Short Term Goals: Ability to demonstrate self-control will improve  I certify that inpatient services furnished can reasonably be expected to improve the patient's condition.  Malvin Johns, MD 5/11/202012:18 PM

## 2018-09-28 NOTE — [facility] Suicide Risk Assessment (Signed)
[facility] Admission Suicide Risk Assessment   Nursing information obtained from:  Patient Demographic factors:  Male, Unemployed, Low socioeconomic status Current Mental Status:  NA Loss Factors:  Financial problems / change in socioeconomic status Historical Factors:  NA Risk Reduction Factors:  NA  Total Time spent with patient: 45 minutes Principal Problem: Masturbation and underlying schizoaffective condition in the context of a negative drug screen and possible non compliance with long-acting injectable Diagnosis:  Active Problems:   Schizo affective schizophrenia (HCC)  Subjective Data: Patient reporting active hallucinations will not elaborate content  Continued Clinical Symptoms:    The "Alcohol Use Disorders Identification Test", Guidelines for Use in Primary Care, Second Edition.  World Science writer [facility]). Score between 0-7:  no or low risk or alcohol related problems. Score between 8-15:  moderate risk of alcohol related problems. Score between 16-19:  high risk of alcohol related problems. Score 20 or above:  warrants further diagnostic evaluation for alcohol dependence and treatment.   CLINICAL FACTORS:   Schizophrenia:   Less than 11 years old   Musculoskeletal: Strength & Muscle Tone: within normal limits Gait & Station: normal Patient leans: N/A  Psychiatric Specialty Exam: Physical Exam no EPS or TD vital stable  ROS illogical denies head trauma or seizures/cardiovascular denies cardiovascular symptoms/GI/GU no complaints  Blood pressure 132/86, pulse 81, temperature 98.3 F (36.8 C), resp. rate 18, SpO2 100 %.There is no height or weight on file to calculate BMI.  General Appearance: Casual  Eye Contact:  Fair  Speech:  Pressured and disjointed statements  Volume:  Decreased  Mood:  Dysphoric  Affect:  Non-Congruent and Inappropriate  Thought Process:  Disorganized, Irrelevant and Descriptions of Associations: Loose  Orientation:  Other:  person place  situation  Thought Content:  Illogical and Hallucinations: Auditory Visual  Suicidal Thoughts:  No  Homicidal Thoughts:  No  Memory:  Immediate;   Poor  Judgement:  Impaired  Insight:  Lacking  Psychomotor Activity:  Decreased  Concentration:  Concentration: Poor  Recall:  Poor  Fund of Knowledge:  Fair  Language:  Fair  Akathisia:  Negative  Handed:  Right  AIMS (if indicated):     Assets:  Leisure Time Physical Health Resilience  ADL's:  Intact  Cognition:  WNL  Sleep:         COGNITIVE FEATURES THAT CONTRIBUTE TO RISK:  Loss of executive function    SUICIDE RISK:   Minimal: No identifiable suicidal ideation.  Patients presenting with no risk factors but with morbid ruminations; may be classified as minimal risk based on the severity of the depressive symptoms  PLAN OF CARE: admit for stabalization  I certify that inpatient services furnished can reasonably be expected to improve the patient's condition.   Malvin Johns, MD 09/28/2018, 12:14 PM

## 2018-09-28 NOTE — Progress Notes (Signed)
Note: Patient transferred to the unit form Observation unit.  Patient presents with irritable affect and mood lability.  Refused to participate with admission process.  Patient is alert and oriented to person.  Routine safety checks initiated.  Patient observed sitting on the floor and responding to internal stimuli.  Patient is safe on the unit.

## 2018-09-28 NOTE — Progress Notes (Signed)
D: Pt denies SI/HI/AVH. Pt is pleasant and cooperative. Pt visible on the unit at times. Pt seen talking to people not seen by staff.  A: Pt was offered support and encouragement. Pt was given scheduled medications. Pt was encourage to attend groups. Q 15 minute checks were done for safety.  R:Pt attends groups and interacts well with peers and staff. Pt is taking medication. Pt has no complaints.Pt receptive to treatment and safety maintained on unit.

## 2018-09-28 NOTE — Progress Notes (Signed)
D:  [name] was up and pacing the unit earlier in the shift.  He was pleasant, cooperative but reporting feeling irritable at times.  He denied SI/HI or A/V hallucinations.  He requested maalox with good relief.  He took his hs medications without difficulty and is currently resting with his eyes closed and appears to be asleep. A:  1:1 with RN for support and encouragement.  Medications as ordered.  Q 15 minute checks maintained for safety. R:  [name] remains safe on the unit.

## 2018-09-29 LAB — PROLACTIN: Prolactin: 24.8 ng/mL — ABNORMAL HIGH (ref 4.0–15.2)

## 2018-09-29 MED ORDER — FLUPHENAZINE HCL 10 MG PO TABS
10.0000 mg | ORAL_TABLET | Freq: Two times a day (BID) | ORAL | Status: DC
  Administered 2018-09-29 – 2018-10-01 (×5): 10 mg via ORAL
  Filled 2018-09-29 (×7): qty 1

## 2018-09-29 MED ORDER — NICOTINE 21 MG/24HR TD PT24
21.0000 mg | MEDICATED_PATCH | Freq: Every day | TRANSDERMAL | Status: DC
  Administered 2018-09-29 – 2018-10-01 (×3): 21 mg via TRANSDERMAL
  Filled 2018-09-29 (×5): qty 1

## 2018-09-29 MED ORDER — FLUPHENAZINE HCL 10 MG PO TABS: 10.0000 mg | ORAL_TABLET | Freq: Three times a day (TID) | ORAL | Status: DC

## 2018-09-29 NOTE — Progress Notes (Signed)
Recreation Therapy Notes  INPATIENT RECREATION THERAPY ASSESSMENT  Patient Details Name: [name] MRN: [id number] DOB: 1986/03/27 Today's Date: 09/29/2018       Information Obtained From: Patient  Able to Participate in Assessment/Interview: Yes  Patient Presentation: Alert  Reason for Admission (Per Patient): Other (Comments)(Pt stated he can't  focus, issues at home and people were out to get him.)  Patient Stressors: Family, Other (Comment)(Mom; No access to money for drugs)  Coping Skills:   Sports, TV, Music, Exercise, Meditate, Deep Breathing, Talk, Read  Leisure Interests (2+):  Sports - Basketball, Individual - Phone, Social - Family, Technical brewer - Other (Comment)(Skating)  Frequency of Recreation/Participation: Other (Comment)(Daily)  Awareness of Community Resources:  Yes  Community Resources:  Other (Comment), Park(Basketball court)  Current Use: Yes  If no, Barriers?:    Expressed Interest in [employer] Information: No  County of Residence:  [place]  Patient Main Form of Transportation: Walk  Patient Strengths:  Dichotomy; Focus  Patient Identified Areas of Improvement:  Patience; Mood Swings  Patient Goal for Hospitalization:  "get better, get what I need to go home"  Current SI (including self-harm):  No  Current HI:  No  Current AVH: No  Staff Intervention Plan: Group Attendance, Collaborate with Interdisciplinary Treatment Team  Consent to Intern Participation: N/A    Caroll Rancher, LRT/CTRS  Caroll Rancher A 09/29/2018, 11:35 AM

## 2018-09-29 NOTE — Progress Notes (Signed)
[facility] NOVEL CORONAVIRUS (COVID-19) DAILY CHECK-OFF SYMPTOMS - answer yes or no to each - every day NO YES  Have you had a fever in the past 24 hours?  . Fever (Temp > 37.80C / 100F) X   Have you had any of these symptoms in the past 24 hours? . New Cough .  Sore Throat  .  Shortness of Breath .  Difficulty Breathing .  Unexplained Body Aches   X   Have you had any one of these symptoms in the past 24 hours not related to allergies?   . Runny Nose .  Nasal Congestion .  Sneezing   X   If you have had runny nose, nasal congestion, sneezing in the past 24 hours, has it worsened?  X   EXPOSURES - check yes or no X   Have you traveled outside the state in the past 14 days?  X   Have you been in contact with someone with a confirmed diagnosis of COVID-19 or PUI in the past 14 days without wearing appropriate PPE?  X   Have you been living in the same home as a person with confirmed diagnosis of COVID-19 or a PUI (household contact)?    X   Have you been diagnosed with COVID-19?    X              What to do next: Answered NO to all: Answered YES to anything:   Proceed with unit schedule Follow the [facility] Inpatient Flowsheet.   

## 2018-09-29 NOTE — [facility] Suicide Risk Assessment (Signed)
[facility] INPATIENT:  Family/Significant Other Suicide Prevention Education  Suicide Prevention Education:  Patient Refusal for Family/Significant Other Suicide Prevention Education: The patient [name] has refused to provide written consent for family/significant other to be provided Family/Significant Other Suicide Prevention Education during admission and/or prior to discharge.  Physician notified.  Lorri Frederick, LCSW 09/29/2018, 4:09 PM

## 2018-09-29 NOTE — [facility] Group Notes (Signed)
[facility] LCSW Group Therapy Note  Date/Time: 09/29/2018; 11am  Type of Therapy/Topic:  Group Therapy:  Feelings about Diagnosis  Participation Level:  Minimal   Mood: Pleasant   Description of Group:    This group will allow patients to explore their thoughts and feelings about diagnoses they have received. Patients will be guided to explore their level of understanding and acceptance of these diagnoses. Facilitator will encourage patients to process their thoughts and feelings about the reactions of others to their diagnosis, and will guide patients in identifying ways to discuss their diagnosis with significant others in their lives. This group will be process-oriented, with patients participating in exploration of their own experiences as well as giving and receiving support and challenge from other group members.   Therapeutic Goals: 1. Patient will demonstrate understanding of diagnosis as evidence by identifying two or more symptoms of the disorder:  2. Patient will be able to express two feelings regarding the diagnosis 3. Patient will demonstrate ability to communicate their needs through discussion and/or role plays  Summary of Patient Progress:   Pt was engaged throughout group. Pt did not talk much at all throughout group but was able to discuss how having hope is a coping skills for his diagnosis.      Therapeutic Modalities:   Cognitive Behavioral Therapy Brief Therapy Feelings Identification   Stephannie Peters, LCSW

## 2018-09-29 NOTE — Progress Notes (Signed)
Recreation Therapy Notes  Date: 5.12.20 Time: 0950 Location: [address] Dayroom  Group Topic: Wellness  Goal Area(s) Addresses:  Patient will define components of whole wellness. Patient will verbalize benefit of whole wellness.  Behavioral Response: Engaged  Intervention:  Music   Activity:  Exercise.  LRT lead the group in a series of stretches.  Patients then took turns leading the group in exercises of their choice.  Patients could take breaks and get water as needed.  Education: Wellness, Building control surveyor.   Education Outcome: Acknowledges education/In group clarification offered/Needs additional education.   Clinical Observations/Feedback:  Pt was pleasant and appropriate during group. Pt completed some stretches and some of the exercises.  Pt left early and did not return.    Caroll Rancher, LRT/CTRS     Lillia Abed, Micky Overturf A 09/29/2018 11:07 AM

## 2018-09-29 NOTE — Progress Notes (Signed)
[facility] MD Progress Note  09/29/2018 8:29 AM [name]  MRN:  [id number] Subjective:    Patient states he slept well with the Restoril last night states he is not having auditory or visual hallucinations this morning of course is very early he was seen before 830.  He denies wanting to harm self or others, also elaborates that while in jail his Prolixin Decanoate was escalated 100 mg every 3 weeks which is very high dose so we will have to double check that Principal Problem: Schizoaffective disorder, bipolar type (HCC) Diagnosis: Principal Problem:   Schizoaffective disorder, bipolar type (HCC)  Total Time spent with patient: 20 minutes   Past Medical History:  Past Medical History:  Diagnosis Date  . Depression   . Hypertension   . Schizoaffective disorder (HCC)   . Schizophrenia [facility])     Past Surgical History:  Procedure Laterality Date  . BACK SURGERY     Family History:  Family History  Problem Relation Age of Onset  . Allergies Mother   . Asthma Mother    Family Psychiatric  History: neg Social History:  Social History   Substance and Sexual Activity  Alcohol Use Not Currently     Social History   Substance and Sexual Activity  Drug Use Not Currently   Comment: Previous drug use hx -- would not specify    Social History   Socioeconomic History  . Marital status: Single    Spouse name: Not on file  . Number of children: 0  . Years of education: Not on file  . Highest education level: Not on file  Occupational History  . Occupation: Disabled  Social Needs  . Financial resource strain: Not on file  . Food insecurity:    Worry: Not on file    Inability: Not on file  . Transportation needs:    Medical: Not on file    Non-medical: Not on file  Tobacco Use  . Smoking status: Current Every Day Smoker    Packs/day: 1.00    Years: 8.00    Pack years: 8.00    Types: Cigarettes, Pipe  . Smokeless tobacco: Current User  Substance and Sexual Activity   . Alcohol use: Not Currently  . Drug use: Not Currently    Comment: Previous drug use hx -- would not specify  . Sexual activity: Not Currently  Lifestyle  . Physical activity:    Days per week: Not on file    Minutes per session: Not on file  . Stress: Not on file  Relationships  . Social connections:    Talks on phone: Not on file    Gets together: Not on file    Attends religious service: Not on file    Active member of club or organization: Not on file    Attends meetings of clubs or organizations: Not on file    Relationship status: Not on file  Other Topics Concern  . Not on file  Social History Narrative   Single. No children. Lives with two roommates.   Additional Social History:    Pain Medications: See MAR Prescriptions: See MAR Over the Counter: See MAR History of alcohol / drug use?: Yes(Pt would not specify past drug use)                    Sleep: Fair  Appetite:  Good  Current Medications: Current Facility-Administered Medications  Medication Dose Route Frequency Provider Last Rate Last Dose  . acetaminophen (TYLENOL)  tablet 650 mg  650 mg Oral Q6H PRN Oneta Rack, NP   650 mg at 09/28/18 2145  . alum & mag hydroxide-simeth (MAALOX/MYLANTA) 200-200-20 MG/5ML suspension 30 mL  30 mL Oral Q4H PRN Oneta Rack, NP   30 mL at 09/28/18 2146  . benztropine (COGENTIN) tablet 1 mg  1 mg Oral BID Malvin Johns, MD   1 mg at 09/29/18 0804  . diphenhydrAMINE (BENADRYL) capsule 50 mg  50 mg Oral Once Charm Rings, NP      . fluPHENAZine (PROLIXIN) tablet 10 mg  10 mg Oral BID Malvin Johns, MD      . hydrOXYzine (ATARAX/VISTARIL) tablet 25 mg  25 mg Oral TID PRN Oneta Rack, NP      . LORazepam (ATIVAN) tablet 2 mg  2 mg Oral Once Lord, Jamison Y, NP      . magnesium hydroxide (MILK OF MAGNESIA) suspension 30 mL  30 mL Oral Daily PRN Oneta Rack, NP      . nicotine (NICODERM CQ - dosed in mg/24 hours) patch 21 mg  21 mg Transdermal Daily Nira Conn A, NP   21 mg at 09/29/18 0804  . OLANZapine zydis (ZYPREXA) disintegrating tablet 10 mg  10 mg Oral Once Nira Conn A, NP      . temazepam (RESTORIL) capsule 30 mg  30 mg Oral QHS Malvin Johns, MD   30 mg at 09/28/18 2127    Lab Results:  Results for orders placed or performed during the hospital encounter of 09/27/18 (from the past 48 hour(s))  Urine rapid drug screen (hosp performed)not at [facility]     Status: None   Collection Time: 09/27/18  2:36 PM  Result Value Ref Range   Opiates NONE DETECTED NONE DETECTED   Cocaine NONE DETECTED NONE DETECTED   Benzodiazepines NONE DETECTED NONE DETECTED   Amphetamines NONE DETECTED NONE DETECTED   Tetrahydrocannabinol NONE DETECTED NONE DETECTED   Barbiturates NONE DETECTED NONE DETECTED    Comment: (NOTE) DRUG SCREEN FOR MEDICAL PURPOSES ONLY.  IF CONFIRMATION IS NEEDED FOR ANY PURPOSE, NOTIFY LAB WITHIN 5 DAYS. LOWEST DETECTABLE LIMITS FOR URINE DRUG SCREEN Drug Class                     Cutoff (ng/mL) Amphetamine and metabolites    1000 Barbiturate and metabolites    200 Benzodiazepine                 200 Tricyclics and metabolites     300 Opiates and metabolites        300 Cocaine and metabolites        300 THC                            50 Performed at [facility], [address], [place], [place] [address]   CBC     Status: None   Collection Time: 09/28/18  7:03 AM  Result Value Ref Range   WBC 8.2 4.0 - 10.5 K/uL   RBC 5.61 4.22 - 5.81 MIL/uL   Hemoglobin 16.4 13.0 - 17.0 g/dL   HCT 21.3 08.6 - 57.8 %   MCV 87.0 80.0 - 100.0 fL   MCH 29.2 26.0 - 34.0 pg   MCHC 33.6 30.0 - 36.0 g/dL   RDW 46.9 62.9 - 52.8 %   Platelets 329 150 - 400 K/uL   nRBC 0.0 0.0 -  0.2 %    Comment: Performed at [facility], [address], [place], [place] [address]  Comprehensive metabolic panel     Status: Abnormal   Collection Time: 09/28/18  7:03 AM  Result Value Ref Range   Sodium 139 135 - 145  mmol/L   Potassium 3.8 3.5 - 5.1 mmol/L   Chloride 103 98 - 111 mmol/L   CO2 27 22 - 32 mmol/L   Glucose, Bld 136 (H) 70 - 99 mg/dL   BUN 14 6 - 20 mg/dL   Creatinine, Ser 6.04 0.61 - 1.24 mg/dL   Calcium 9.2 8.9 - 54.0 mg/dL   Total Protein 7.7 6.5 - 8.1 g/dL   Albumin 4.2 3.5 - 5.0 g/dL   AST 19 15 - 41 U/L   ALT 30 0 - 44 U/L   Alkaline Phosphatase 90 38 - 126 U/L   Total Bilirubin 0.5 0.3 - 1.2 mg/dL   GFR calc non Af Amer >60 >60 mL/min   GFR calc Af Amer >60 >60 mL/min   Anion gap 9 5 - 15    Comment: Performed at [facility], [address], [place], [place] [address]  Lipid panel     Status: Abnormal   Collection Time: 09/28/18  7:03 AM  Result Value Ref Range   Cholesterol 192 0 - 200 mg/dL   Triglycerides 147 <829 mg/dL   HDL 37 (L) >56 mg/dL   Total CHOL/HDL Ratio 5.2 RATIO   VLDL 24 0 - 40 mg/dL   LDL Cholesterol 213 (H) 0 - 99 mg/dL    Comment:        Total Cholesterol/HDL:CHD Risk Coronary Heart Disease Risk Table                     Men   Women  1/2 Average Risk   3.4   3.3  Average Risk       5.0   4.4  2 X Average Risk   9.6   7.1  3 X Average Risk  23.4   11.0        Use the calculated Patient Ratio above and the CHD Risk Table to determine the patient's CHD Risk.        ATP III CLASSIFICATION (LDL):  <100     mg/dL   Optimal  086-578  mg/dL   Near or Above                    Optimal  130-159  mg/dL   Borderline  469-629  mg/dL   High  >528     mg/dL   Very High Performed at [facility], [address] [place], [place] [address]   Hemoglobin A1c     Status: Abnormal   Collection Time: 09/28/18  7:03 AM  Result Value Ref Range   Hgb A1c MFr Bld 6.2 (H) 4.8 - 5.6 %    Comment: (NOTE) Pre diabetes:          5.7%-6.4% Diabetes:              >6.4% Glycemic control for   <7.0% adults with diabetes    Mean Plasma Glucose 131.24 mg/dL    Comment: Performed at [facility], [address]  [place], [place] [address]  TSH     Status: None   Collection Time: 09/28/18  7:03 AM  Result Value Ref Range   TSH 2.640 0.350 -  4.500 uIU/mL    Comment: Performed by a 3rd Generation assay with a functional sensitivity of <=0.01 uIU/mL. Performed at [facility], [address] [place], [place] [address]   Prolactin     Status: Abnormal   Collection Time: 09/28/18  7:03 AM  Result Value Ref Range   Prolactin 24.8 (H) 4.0 - 15.2 ng/mL    Comment: (NOTE) Performed At: [facility] [address] [place], [place] [id number] Jolene SchimkeNagendra Sanjai MD XB:[phone]     Blood Alcohol level:  Lab Results  Component Value Date   [facility] 11/15/2017   ETH <5 12/27/2015    Metabolic Disorder Labs: Lab Results  Component Value Date   HGBA1C 6.2 (H) 09/28/2018   MPG 131.24 09/28/2018   Lab Results  Component Value Date   PROLACTIN 24.8 (H) 09/28/2018   PROLACTIN 23.0 (H) 06/10/2015   Lab Results  Component Value Date   CHOL 192 09/28/2018   TRIG 120 09/28/2018   HDL 37 (L) 09/28/2018   CHOLHDL 5.2 09/28/2018   VLDL 24 09/28/2018   LDLCALC 131 (H) 09/28/2018   LDLCALC 119 (H) 06/10/2015    Physical Findings: AIMS: Facial and Oral Movements Muscles of Facial Expression: None, normal Lips and Perioral Area: None, normal Jaw: None, normal Tongue: None, normal,Extremity Movements Upper (arms, wrists, hands, fingers): None, normal Lower (legs, knees, ankles, toes): None, normal, Trunk Movements Neck, shoulders, hips: None, normal, Overall Severity Severity of abnormal movements (highest score from questions above): None, normal Incapacitation due to abnormal movements: None, normal Patient's awareness of abnormal movements (rate only patient's report): No Awareness, Dental Status Current problems with teeth and/or dentures?: No Does patient usually wear dentures?: No  CIWA:  CIWA-Ar Total: 0 COWS:  COWS Total Score: 0  Musculoskeletal: Strength & Muscle  Tone: within normal limits Gait & Station: normal Patient leans: N/A  Psychiatric Specialty Exam: Physical Exam  ROS  Blood pressure 127/86, pulse 90, temperature (!) 97.4 F (36.3 C), temperature source Oral, resp. rate 18, SpO2 100 %.There is no height or weight on file to calculate BMI.  General Appearance: Disheveled  Eye Contact:  Fair  Speech:  Clear and Coherent  Volume:  Decreased  Mood:  Dysphoric  Affect:  Congruent  Thought Process:  Goal Directed and Linear  Orientation:  Full (Time, Place, and Person)  Thought Content:  Logical and Tangential  Suicidal Thoughts:  No  Homicidal Thoughts:  No  Memory:  Immediate;   Fair  Judgement:  Fair  Insight:  Fair  Psychomotor Activity:  Normal  Concentration:  Concentration: Fair  Recall:  [employer]Fair  Fund of Knowledge:  Fair  Language:  Fair  Akathisia:  Negative  Handed:  Right  AIMS (if indicated):     Assets:  Physical Health Resilience Social Support Transportation  ADL's:  Intact  Cognition:  WNL  Sleep:  Number of Hours: 6.75     Treatment Plan Summary: Daily contact with patient to assess and evaluate symptoms and progress in treatment, Medication management and Plan Continue current reality-based therapy current meds escalate Prolixin to 10 mg twice daily continue to contact act team about progress try and track down the exact dosing of his Prolixin decanoate as last notes from the act team indicate Prolixin Decanoate at 25 mg every 3 weeks  Malvin JohnsFARAH,Keyasha Miah, MD 09/29/2018, 8:29 AM
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
## 2018-09-30 NOTE — Progress Notes (Signed)
D: The patient repeatedly approached this author to complain about the odor coming from his bedroom. Patient insists that he can not sleep in his bedroom due to some odor.  A: This author used the purple top wipes to clean his bathroom and removed the towels from the bathroom floor. Used hospital approved disinfecting spray.  R: Pt. Insists that his room continues to smell and after he came down to his bedroom to inspect this author's work. He also states that something is going on his room and that something has taken place in his bedroom.

## 2018-09-30 NOTE — BHH Group Notes (Signed)
Occupational Therapy Group Note  Date:  09/30/2018 Time:  2:42 PM  Group Topic/Focus:  Leisure Group  Participation Level:  Active  Participation Quality:  Appropriate  Affect:  Flat  Cognitive:  Appropriate  Insight: Lacking  Engagement in Group:  Engaged  Modes of Intervention:  Activity, Discussion, Education and Socialization  Additional Comments:    S: "I like this game"  O: OT group with focus on leisure and coping skills this date by using the game of Uno. Pt observed for attention, rule following, temperament, and socialization skills while playing the game. Pt to name a variety of coping skills per special cards to include (stress management, a current struggle you're facing, healthy anger management, a healthy communication skill, and preferred leisure activity). Discussion encouraged for healthy coping skills.  A: Pt presents with flat affect, engaged and participatory to game. He states he enjoys this game. Overall he does well attending to the activity, and is even seen helping another member whom has never played before. Pt does show difficulty with attending to activity for sustained time as well as rule following/temperment. Once he drew many cards, he stated he "quit" and left the group room. Pt did not name any coping skills this date, but continued to help the other player who was struggling to learn the game.  P: OT group will be x1 per week while pt inpatient.  Dalphine Handing, MSOT, OTR/L Behavioral Health OT/ Acute Relief OT PHP Office: [phone]  Dalphine Handing 09/30/2018, 2:42 PM

## 2018-09-30 NOTE — Progress Notes (Signed)
Nursing Progress Note: 7p-7a D: Pt currently presents with a paranoid/anxious/disheveled affect and behavior. Pt states, "I don't want a roommate. You are gonna have to restrain me before I go back to my room." Interacting appropriately with the milieu. Pt reports good sleep during the previous night with current medication regimen.  A: Pt provided with medications per providers orders. Pt's labs and vitals were monitored throughout the night. Pt supported emotionally and encouraged to express concerns and questions. Pt educated on medications.  R: Pt's safety ensured with 15 minute and environmental checks. Pt currently denies SI, HI, and endorses AVH. Pt verbally contracts to seek staff if SI,HI, or AVH occurs and to consult with staff before acting on any harmful thoughts. Will continue to monitor.

## 2018-09-30 NOTE — Progress Notes (Addendum)
CSW spoke with pt regarding legal guardianship on Tuesday, 5/12: pt reports he does not a legal guardian and he is not sure why it says this in his chart.  CSW will speak with ACT team about this to confirm.  CSW spoke with [facility] at [facility], per their records, pt does not have a legal guardian. Garner Nash, MSW, LCSW Clinical Social Worker 09/30/2018 12:59 PM

## 2018-09-30 NOTE — Progress Notes (Signed)
[facility] MD Progress Note  09/30/2018 10:25 AM [name]  MRN:  [id number] Subjective:   Patient is a bit intrusive asks for an interview repeatedly when he gets hit states he is sorry for asking so forth he tends to pace around and does talk to himself laugh to himself so we believe he is responding to hallucinations but he continues to deny that this is an issue, continue cognitive therapy we also discussed changing medications but he states he likes Prolixin and does not want to change it Principal Problem: Schizoaffective disorder, bipolar type (HCC) Diagnosis: Principal Problem:   Schizoaffective disorder, bipolar type (HCC)  Total Time spent with patient: 20 minutes   Past Medical History:  Past Medical History:  Diagnosis Date  . Depression   . Hypertension   . Schizoaffective disorder (HCC)   . Schizophrenia [facility])     Past Surgical History:  Procedure Laterality Date  . BACK SURGERY     Family History:  Family History  Problem Relation Age of Onset  . Allergies Mother   . Asthma Mother    Family Psychiatric  History: neg Social History:  Social History   Substance and Sexual Activity  Alcohol Use Not Currently     Social History   Substance and Sexual Activity  Drug Use Not Currently   Comment: Previous drug use hx -- would not specify    Social History   Socioeconomic History  . Marital status: Single    Spouse name: Not on file  . Number of children: 0  . Years of education: Not on file  . Highest education level: Not on file  Occupational History  . Occupation: Disabled  Social Needs  . Financial resource strain: Not on file  . Food insecurity:    Worry: Not on file    Inability: Not on file  . Transportation needs:    Medical: Not on file    Non-medical: Not on file  Tobacco Use  . Smoking status: Current Every Day Smoker    Packs/day: 1.00    Years: 8.00    Pack years: 8.00    Types: Cigarettes, Pipe  . Smokeless tobacco: Current User   Substance and Sexual Activity  . Alcohol use: Not Currently  . Drug use: Not Currently    Comment: Previous drug use hx -- would not specify  . Sexual activity: Not Currently  Lifestyle  . Physical activity:    Days per week: Not on file    Minutes per session: Not on file  . Stress: Not on file  Relationships  . Social connections:    Talks on phone: Not on file    Gets together: Not on file    Attends religious service: Not on file    Active member of club or organization: Not on file    Attends meetings of clubs or organizations: Not on file    Relationship status: Not on file  Other Topics Concern  . Not on file  Social History Narrative   Single. No children. Lives with two roommates.   Additional Social History:    Pain Medications: See MAR Prescriptions: See MAR Over the Counter: See MAR History of alcohol / drug use?: Yes(Pt would not specify past drug use)                    Sleep: Good  Appetite:  Good  Current Medications: Current Facility-Administered Medications  Medication Dose Route Frequency Provider Last Rate Last Dose  .  acetaminophen (TYLENOL) tablet 650 mg  650 mg Oral Q6H PRN Oneta Rack, NP   650 mg at 09/30/18 0806  . alum & mag hydroxide-simeth (MAALOX/MYLANTA) 200-200-20 MG/5ML suspension 30 mL  30 mL Oral Q4H PRN Oneta Rack, NP   30 mL at 09/29/18 2132  . benztropine (COGENTIN) tablet 1 mg  1 mg Oral BID Malvin Johns, MD   1 mg at 09/30/18 0806  . diphenhydrAMINE (BENADRYL) capsule 50 mg  50 mg Oral Once Charm Rings, NP      . fluPHENAZine (PROLIXIN) tablet 10 mg  10 mg Oral BID Malvin Johns, MD   10 mg at 09/30/18 1610  . hydrOXYzine (ATARAX/VISTARIL) tablet 25 mg  25 mg Oral TID PRN Oneta Rack, NP      . LORazepam (ATIVAN) tablet 2 mg  2 mg Oral Once Lord, Jamison Y, NP      . magnesium hydroxide (MILK OF MAGNESIA) suspension 30 mL  30 mL Oral Daily PRN Oneta Rack, NP   30 mL at 09/30/18 0817  . nicotine  (NICODERM CQ - dosed in mg/24 hours) patch 21 mg  21 mg Transdermal Daily Nira Conn A, NP   21 mg at 09/30/18 0806  . OLANZapine zydis (ZYPREXA) disintegrating tablet 10 mg  10 mg Oral Once Nira Conn A, NP      . temazepam (RESTORIL) capsule 30 mg  30 mg Oral QHS Malvin Johns, MD   30 mg at 09/29/18 2131    Lab Results: No results found for this or any previous visit (from the past 48 hour(s)).  Blood Alcohol level:  Lab Results  Component Value Date   ETH <10 11/15/2017   ETH <5 12/27/2015    Metabolic Disorder Labs: Lab Results  Component Value Date   HGBA1C 6.2 (H) 09/28/2018   MPG 131.24 09/28/2018   Lab Results  Component Value Date   PROLACTIN 24.8 (H) 09/28/2018   PROLACTIN 23.0 (H) 06/10/2015   Lab Results  Component Value Date   CHOL 192 09/28/2018   TRIG 120 09/28/2018   HDL 37 (L) 09/28/2018   CHOLHDL 5.2 09/28/2018   VLDL 24 09/28/2018   LDLCALC 131 (H) 09/28/2018   LDLCALC 119 (H) 06/10/2015    Physical Findings: AIMS: Facial and Oral Movements Muscles of Facial Expression: None, normal Lips and Perioral Area: None, normal Jaw: None, normal Tongue: None, normal,Extremity Movements Upper (arms, wrists, hands, fingers): None, normal Lower (legs, knees, ankles, toes): None, normal, Trunk Movements Neck, shoulders, hips: None, normal, Overall Severity Severity of abnormal movements (highest score from questions above): None, normal Incapacitation due to abnormal movements: None, normal Patient's awareness of abnormal movements (rate only patient's report): No Awareness, Dental Status Current problems with teeth and/or dentures?: No Does patient usually wear dentures?: No  CIWA:  CIWA-Ar Total: 0 COWS:  COWS Total Score: 0  Musculoskeletal: Strength & Muscle Tone: within normal limits Gait & Station: normal Patient leans: N/A  Psychiatric Specialty Exam: Physical Exam  ROS  Blood pressure 127/86, pulse 90, temperature (!) 97.4 F (36.3 C),  temperature source Oral, resp. rate 18, SpO2 100 %.There is no height or weight on file to calculate BMI.  General Appearance: Disheveled  Eye Contact:  Minimal  Speech:  Slow  Volume:  Decreased  Mood:  Depressed and Dysphoric  Affect:  Blunt  Thought Process:  Disorganized and Irrelevant  Orientation:  Full (Time, Place, and Person)  Thought Content:  Hallucinations: Auditory, Paranoid Ideation  and Tangential  Suicidal Thoughts:  No  Homicidal Thoughts:  No  Memory:  Immediate;   Fair  Judgement:  Poor  Insight:  Shallow  Psychomotor Activity:  Decreased  Concentration:  Concentration: Fair  Recall:  [employer]  Fund of Knowledge:  Fair  Language:  Fair  Akathisia:  NA  Handed:  Right  AIMS (if indicated):     Assets:  Communication Skills Physical Health Resilience  ADL's:  Intact  Cognition:  WNL  Sleep:  Number of Hours: 6.75     Treatment Plan Summary: Daily contact with patient to assess and evaluate symptoms and progress in treatment, Medication management and Plan Continue Prolixin oral in anticipation of long-acting injectable continue reality-based therapy and current precautions no change in meds otherwise  Shashank Kwasnik, MD 09/30/2018, 10:25 AM

## 2018-09-30 NOTE — Progress Notes (Signed)
Recreation Therapy Notes  Date: 5.13.20 Time: 1000 Location: 500 Hall Dayroom  Group Topic: Goal Setting  Goal Area(s) Addresses:  Patient will be able to identify at least 3 goals.  Patient will be able to identify obstacles to reaching goals.  Patient will be able to identify what is needed to achieve goals.   Behavioral Response:  Engaged  Intervention:  Worksheet  Activity:  Garment/textile technologist.  Patients were to identify goals they want to accomplish in a week, month, year and 5 years.  Patients were to then identify obstacles to goals, what they need to achieve goals and what they can start doing now to reach goals.  Education:  Discharge Planning, Pharmacologist, Leisure Education   Education Outcome: Acknowledges Education/In Group Clarification Provided/Needs Additional Education  Clinical Observations:  Pt stated in a week he wants to be able to cope with anger and organize; month- have discipline, honesty and helpfulness; year- start person centered plan with all directives in order and in 5 yrs have assertiveness and positivity.  Pt expressed obstacles were people, places and things; what he needs to achieve goals are patience and creativeness.  Pt could not read is witting for what he could start doing to reach goals.    Caroll Rancher, LRT/CTRS   Caroll Rancher A 09/30/2018 11:16 AM

## 2018-09-30 NOTE — Progress Notes (Signed)
[place] NOVEL CORONAVIRUS (COVID-19) DAILY CHECK-OFF SYMPTOMS - answer yes or no to each - every day NO YES  Have you had a fever in the past 24 hours?  . Fever (Temp > 37.80C / 100F) X   Have you had any of these symptoms in the past 24 hours? . New Cough .  Sore Throat  .  Shortness of Breath .  Difficulty Breathing .  Unexplained Body Aches   X   Have you had any one of these symptoms in the past 24 hours not related to allergies?   . Runny Nose .  Nasal Congestion .  Sneezing   X   If you have had runny nose, nasal congestion, sneezing in the past 24 hours, has it worsened?  X   EXPOSURES - check yes or no X   Have you traveled outside the state in the past 14 days?  X   Have you been in contact with someone with a confirmed diagnosis of COVID-19 or PUI in the past 14 days without wearing appropriate PPE?  X   Have you been living in the same home as a person with confirmed diagnosis of COVID-19 or a PUI (household contact)?    X   Have you been diagnosed with COVID-19?    X              What to do next: Answered NO to all: Answered YES to anything:   Proceed with unit schedule Follow the [facility] Inpatient Flowsheet.   

## 2018-09-30 NOTE — Tx Team (Signed)
Interdisciplinary Treatment and Diagnostic Plan Update  09/30/2018 Time of Session: 0925 [name] MRN: [id number]  Principal Diagnosis: Schizoaffective disorder, bipolar type [facility])  Secondary Diagnoses: Principal Problem:   Schizoaffective disorder, bipolar type (HCC)   Current Medications:  Current Facility-Administered Medications  Medication Dose Route Frequency Provider Last Rate Last Dose  . acetaminophen (TYLENOL) tablet 650 mg  650 mg Oral Q6H PRN Oneta Rack, NP   650 mg at 09/30/18 0806  . alum & mag hydroxide-simeth (MAALOX/MYLANTA) 200-200-20 MG/5ML suspension 30 mL  30 mL Oral Q4H PRN Oneta Rack, NP   30 mL at 09/29/18 2132  . benztropine (COGENTIN) tablet 1 mg  1 mg Oral BID Malvin Johns, MD   1 mg at 09/30/18 0806  . diphenhydrAMINE (BENADRYL) capsule 50 mg  50 mg Oral Once Charm Rings, NP      . fluPHENAZine (PROLIXIN) tablet 10 mg  10 mg Oral BID Malvin Johns, MD   10 mg at 09/30/18 2549  . hydrOXYzine (ATARAX/VISTARIL) tablet 25 mg  25 mg Oral TID PRN Oneta Rack, NP      . LORazepam (ATIVAN) tablet 2 mg  2 mg Oral Once Lord, Jamison Y, NP      . magnesium hydroxide (MILK OF MAGNESIA) suspension 30 mL  30 mL Oral Daily PRN Oneta Rack, NP   30 mL at 09/30/18 0817  . nicotine (NICODERM CQ - dosed in mg/24 hours) patch 21 mg  21 mg Transdermal Daily Nira Conn A, NP   21 mg at 09/30/18 0806  . OLANZapine zydis (ZYPREXA) disintegrating tablet 10 mg  10 mg Oral Once Nira Conn A, NP      . temazepam (RESTORIL) capsule 30 mg  30 mg Oral QHS Malvin Johns, MD   30 mg at 09/29/18 2131   PTA Medications: Medications Prior to Admission  Medication Sig Dispense Refill Last Dose  . fluPHENAZine decanoate (PROLIXIN) 25 MG/ML injection Inject into the muscle every 14 (fourteen) days.    Not Taking at Unknown time    Patient Stressors:    Patient Strengths:    Treatment Modalities: Medication Management, Group therapy, Case management,  1 to 1  session with clinician, Psychoeducation, Recreational therapy.   Physician Treatment Plan for Primary Diagnosis: Schizoaffective disorder, bipolar type (HCC) Long Term Goal(s): Improvement in symptoms so as ready for discharge Improvement in symptoms so as ready for discharge   Short Term Goals: Ability to verbalize feelings will improve Ability to demonstrate self-control will improve  Medication Management: Evaluate patient's response, side effects, and tolerance of medication regimen.  Therapeutic Interventions: 1 to 1 sessions, Unit Group sessions and Medication administration.  Evaluation of Outcomes: Progressing  Physician Treatment Plan for Secondary Diagnosis: Principal Problem:   Schizoaffective disorder, bipolar type (HCC)  Long Term Goal(s): Improvement in symptoms so as ready for discharge Improvement in symptoms so as ready for discharge   Short Term Goals: Ability to verbalize feelings will improve Ability to demonstrate self-control will improve     Medication Management: Evaluate patient's response, side effects, and tolerance of medication regimen.  Therapeutic Interventions: 1 to 1 sessions, Unit Group sessions and Medication administration.  Evaluation of Outcomes: Progressing   RN Treatment Plan for Primary Diagnosis: Schizoaffective disorder, bipolar type (HCC) Long Term Goal(s): Knowledge of disease and therapeutic regimen to maintain health will improve  Short Term Goals: Ability to identify and develop effective coping behaviors will improve and Compliance with prescribed medications will improve  Medication  Management: RN will administer medications as ordered by provider, will assess and evaluate patient's response and provide education to patient for prescribed medication. RN will report any adverse and/or side effects to prescribing provider.  Therapeutic Interventions: 1 on 1 counseling sessions, Psychoeducation, Medication administration, Evaluate  responses to treatment, Monitor vital signs and CBGs as ordered, Perform/monitor CIWA, COWS, AIMS and Fall Risk screenings as ordered, Perform wound care treatments as ordered.  Evaluation of Outcomes: Progressing   LCSW Treatment Plan for Primary Diagnosis: Schizoaffective disorder, bipolar type (HCC) Long Term Goal(s): Safe transition to appropriate next level of care at discharge, Engage patient in therapeutic group addressing interpersonal concerns.  Short Term Goals: Engage patient in aftercare planning with referrals and resources, Increase social support and Increase skills for wellness and recovery  Therapeutic Interventions: Assess for all discharge needs, 1 to 1 time with Social worker, Explore available resources and support systems, Assess for adequacy in community support network, Educate family and significant other(s) on suicide prevention, Complete Psychosocial Assessment, Interpersonal group therapy.  Evaluation of Outcomes: Progressing   Progress in Treatment: Attending groups: Yes. Participating in groups: Yes. Taking medication as prescribed: Yes. Toleration medication: Yes. Family/Significant other contact made: No, will contact:  pt declined consent Patient understands diagnosis: No. Discussing patient identified problems/goals with staff: Yes. Medical problems stabilized or resolved: Yes. Denies suicidal/homicidal ideation: Yes. Issues/concerns per patient self-inventory: No. Other: none  New problem(s) identified: No, Describe:  none  New Short Term/Long Term Goal(s):  Patient Goals:  "leave when I'm ready"  Discharge Plan or Barriers:   Reason for Continuation of Hospitalization: Hallucinations Medication stabilization  Estimated Length of Stay:3-5 days.  Attendees: Patient:[name] [facility] 09/30/2018   Physician: Dr. Jeannine KittenFarah, MD 09/30/2018   Nursing: Elby Beckoni Waller, RN 09/30/2018   RN Care Manager: 09/30/2018   Social Worker: Daleen SquibbGreg Sharaya Boruff, LCSW 09/30/2018    Recreational Therapist:  09/30/2018   Other:  09/30/2018   Other:  09/30/2018   Other: 09/30/2018        Scribe for Treatment Team: Lorri FrederickWierda, Anyra Kaufman Jon, LCSW 09/30/2018 12:59 PM

## 2018-10-01 MED ORDER — FLUPHENAZINE HCL 5 MG PO TABS
15.0000 mg | ORAL_TABLET | Freq: Two times a day (BID) | ORAL | Status: DC
  Administered 2018-10-02: 15 mg via ORAL
  Filled 2018-10-01 (×4): qty 3

## 2018-10-01 NOTE — Progress Notes (Signed)
[facility] MD Progress Note  10/01/2018 10:31 AM [name]  MRN:  [id number] Subjective:    Patient endorses then denies auditory hallucinations does not appear to be responding as much today though probably having some improvement denies wanting to harm self or others limited engagement and one-to-one though and not participating much in reality based therapy Principal Problem: Schizoaffective disorder, bipolar type (HCC) Diagnosis: Principal Problem:   Schizoaffective disorder, bipolar type (HCC)  Total Time spent with patient: 20 minutes  Past Medical History:  Past Medical History:  Diagnosis Date  . Depression   . Hypertension   . Schizoaffective disorder (HCC)   . Schizophrenia [facility])     Past Surgical History:  Procedure Laterality Date  . BACK SURGERY     Family History:  Family History  Problem Relation Age of Onset  . Allergies Mother   . Asthma Mother    Family Psychiatric  History: neg Social History:  Social History   Substance and Sexual Activity  Alcohol Use Not Currently     Social History   Substance and Sexual Activity  Drug Use Not Currently   Comment: Previous drug use hx -- would not specify    Social History   Socioeconomic History  . Marital status: Single    Spouse name: Not on file  . Number of children: 0  . Years of education: Not on file  . Highest education level: Not on file  Occupational History  . Occupation: Disabled  Social Needs  . Financial resource strain: Not on file  . Food insecurity:    Worry: Not on file    Inability: Not on file  . Transportation needs:    Medical: Not on file    Non-medical: Not on file  Tobacco Use  . Smoking status: Current Every Day Smoker    Packs/day: 1.00    Years: 8.00    Pack years: 8.00    Types: Cigarettes, Pipe  . Smokeless tobacco: Current User  Substance and Sexual Activity  . Alcohol use: Not Currently  . Drug use: Not Currently    Comment: Previous drug use hx -- would not  specify  . Sexual activity: Not Currently  Lifestyle  . Physical activity:    Days per week: Not on file    Minutes per session: Not on file  . Stress: Not on file  Relationships  . Social connections:    Talks on phone: Not on file    Gets together: Not on file    Attends religious service: Not on file    Active member of club or organization: Not on file    Attends meetings of clubs or organizations: Not on file    Relationship status: Not on file  Other Topics Concern  . Not on file  Social History Narrative   Single. No children. Lives with two roommates.   Additional Social History:    Pain Medications: See MAR Prescriptions: See MAR Over the Counter: See MAR History of alcohol / drug use?: Yes(Pt would not specify past drug use)                    Sleep: Fair  Appetite:  Fair  Current Medications: Current Facility-Administered Medications  Medication Dose Route Frequency Provider Last Rate Last Dose  . acetaminophen (TYLENOL) tablet 650 mg  650 mg Oral Q6H PRN Oneta Rack, NP   650 mg at 09/30/18 0806  . alum & mag hydroxide-simeth (MAALOX/MYLANTA) 200-200-20 MG/5ML suspension  30 mL  30 mL Oral Q4H PRN Oneta Rack, NP   30 mL at 09/29/18 2132  . benztropine (COGENTIN) tablet 1 mg  1 mg Oral BID Malvin Johns, MD   1 mg at 10/01/18 5784  . diphenhydrAMINE (BENADRYL) capsule 50 mg  50 mg Oral Once Charm Rings, NP      . fluPHENAZine (PROLIXIN) tablet 15 mg  15 mg Oral BID Malvin Johns, MD      . hydrOXYzine (ATARAX/VISTARIL) tablet 25 mg  25 mg Oral TID PRN Oneta Rack, NP   25 mg at 09/30/18 2105  . LORazepam (ATIVAN) tablet 2 mg  2 mg Oral Once Lord, Jamison Y, NP      . magnesium hydroxide (MILK OF MAGNESIA) suspension 30 mL  30 mL Oral Daily PRN Oneta Rack, NP   30 mL at 09/30/18 0817  . nicotine (NICODERM CQ - dosed in mg/24 hours) patch 21 mg  21 mg Transdermal Daily Nira Conn A, NP   21 mg at 10/01/18 0733  . OLANZapine zydis  (ZYPREXA) disintegrating tablet 10 mg  10 mg Oral Once Nira Conn A, NP      . temazepam (RESTORIL) capsule 30 mg  30 mg Oral QHS Malvin Johns, MD   30 mg at 09/30/18 2105    Lab Results: No results found for this or any previous visit (from the past 48 hour(s)).  Blood Alcohol level:  Lab Results  Component Value Date   ETH <10 11/15/2017   ETH <5 12/27/2015    Metabolic Disorder Labs: Lab Results  Component Value Date   HGBA1C 6.2 (H) 09/28/2018   MPG 131.24 09/28/2018   Lab Results  Component Value Date   PROLACTIN 24.8 (H) 09/28/2018   PROLACTIN 23.0 (H) 06/10/2015   Lab Results  Component Value Date   CHOL 192 09/28/2018   TRIG 120 09/28/2018   HDL 37 (L) 09/28/2018   CHOLHDL 5.2 09/28/2018   VLDL 24 09/28/2018   LDLCALC 131 (H) 09/28/2018   LDLCALC 119 (H) 06/10/2015    Physical Findings: AIMS: Facial and Oral Movements Muscles of Facial Expression: None, normal Lips and Perioral Area: None, normal Jaw: None, normal Tongue: None, normal,Extremity Movements Upper (arms, wrists, hands, fingers): None, normal Lower (legs, knees, ankles, toes): None, normal, Trunk Movements Neck, shoulders, hips: None, normal, Overall Severity Severity of abnormal movements (highest score from questions above): None, normal Incapacitation due to abnormal movements: None, normal Patient's awareness of abnormal movements (rate only patient's report): No Awareness, Dental Status Current problems with teeth and/or dentures?: No Does patient usually wear dentures?: No  CIWA:  CIWA-Ar Total: 0 COWS:  COWS Total Score: 0  Musculoskeletal: Strength & Muscle Tone: within normal limits Gait & Station: normal Patient leans: N/A  Psychiatric Specialty Exam: Physical Exam  ROS  Blood pressure 131/80, pulse 89, temperature 98 F (36.7 C), temperature source Oral, resp. rate 18, SpO2 100 %.There is no height or weight on file to calculate BMI.  General Appearance: Casual  Eye  Contact:  Fair  Speech:  Clear and Coherent and Slow  Volume:  Decreased  Mood:  Dysphoric  Affect:  Appropriate  Thought Process:  Irrelevant and Descriptions of Associations: Loose  Orientation:  Full (Time, Place, and Person)  Thought Content:  Hallucinations: Auditory and Tangential  Suicidal Thoughts:  No  Homicidal Thoughts:  No  Memory:  Immediate;   Fair  Judgement:  Fair  Insight:  Fair  Psychomotor Activity:  Normal  Concentration:  Concentration: Fair  Recall:  [employer]  Fund of Knowledge:  Fair  Language: Paucity overall reflecting poverty of content of thought  Akathisia:  Negative  Handed:  Right  AIMS (if indicated):     Assets:  Resilience  ADL's:  Intact  Cognition:  WNL  Sleep:  Number of Hours: 6.75     Treatment Plan Summary: Daily contact with patient to assess and evaluate symptoms and progress in treatment, Medication management and Plan Continue but escalate Prolixin again we will administer long-acting injectable prior to discharge continue to keep active informed continue to engage in therapy  Malvin JohnsFARAH,Galdino Hinchman, MD 10/01/2018, 10:31 AM

## 2018-10-01 NOTE — [facility] Group Notes (Signed)
[facility] LCSW Group Therapy Note  Date/Time: 10/01/18, 1315  Type of Therapy/Topic:  Group Therapy:  Balance in Life  Participation Level:  active  Description of Group:    This group will address the concept of balance and how it feels and looks when one is unbalanced. Patients will be encouraged to process areas in their lives that are out of balance, and identify reasons for remaining unbalanced. Facilitators will guide patients utilizing problem- solving interventions to address and correct the stressor making their life unbalanced. Understanding and applying boundaries will be explored and addressed for obtaining  and maintaining a balanced life. Patients will be encouraged to explore ways to assertively make their unbalanced needs known to significant others in their lives, using other group members and facilitator for support and feedback.  Therapeutic Goals: 1. Patient will identify two or more emotions or situations they have that consume much of in their lives. 2. Patient will identify signs/triggers that life has become out of balance:  3. Patient will identify two ways to set boundaries in order to achieve balance in their lives:  4. Patient will demonstrate ability to communicate their needs through discussion and/or role plays  Summary of Patient Progress:Pt was more active in group today, stated that physical and financial are areas in his life that are out of balance.  When asked by CSW to expand on his comments and share with the group how these areas are out of balance, pt had a hard time being able put thoughts into a coherent sentence.  Pt was pleasant and engaged throughout.           Therapeutic Modalities:   Cognitive Behavioral Therapy Solution-Focused Therapy Assertiveness Training  [place], [place]

## 2018-10-01 NOTE — Progress Notes (Signed)
Did not attend group 

## 2018-10-01 NOTE — Plan of Care (Signed)
Progress note  Pt found in the dayroom; compliant with medication administration. Pt denies any physical symptoms, but does endorse r hand pain that he rates at an 8/10. Pt is still animated and rambling in his speech. Pt given medication per protocol and standing orders. Pt provided support and encouragement. Pt is pleasant. Pt denies si/hi/ah/vh and verbally agrees to approach staff if these become apparent or before harming himself/others while at [facility]. Pt safe on the unit. Will continue to monitor. Q82m safety checks implemented and continued.  Pt progressing in the following metrics  Problem: Education: Goal: Knowledge of [facility] Education information/materials will improve Outcome: Progressing Goal: Emotional status will improve Outcome: Progressing Goal: Mental status will improve Outcome: Progressing Goal: Verbalization of understanding the information provided will improve Outcome: Progressing

## 2018-10-01 NOTE — Progress Notes (Signed)
Recreation Therapy Notes  Date: 5.14.20 Time: 1000 Location: 500 Hall Dayroom  Group Topic: Self-Esteem  Goal Area(s) Addresses:  Patient will successfully identify positive attributes about themselves.  Patient will successfully identify benefit of improved self-esteem.   Intervention: Colored pencils, blank license plate  Activity: Personalized Plates.  Patients were given supplies to create a customized license plate that highlights some of the positive things about them.  Patients could highlight activities, people, dates, etc.  Education:  Self-Esteem, Building control surveyor.   Education Outcome: Acknowledges education/In group clarification offered/Needs additional education  Clinical Observations/Feedback: Pt did not attend group.     Caroll Rancher, LRT/CTRS         Caroll Rancher A 10/01/2018 11:19 AM

## 2018-10-02 MED ORDER — FLUPHENAZINE DECANOATE 25 MG/ML IJ SOLN
50.0000 mg | INTRAMUSCULAR | Status: DC
  Administered 2018-10-02: 50 mg via INTRAMUSCULAR
  Filled 2018-10-02: qty 2

## 2018-10-02 MED ORDER — FLUPHENAZINE HCL 10 MG PO TABS: ORAL_TABLET | ORAL | 11 refills | Status: DC

## 2018-10-02 MED ORDER — FLUPHENAZINE DECANOATE 25 MG/ML IJ SOLN: 50.0000 mg | INTRAMUSCULAR | 11 refills | Status: AC

## 2018-10-02 NOTE — Progress Notes (Signed)
Patient up and irritable this AM. States, "I can't believe it's morning time. I thought it was midnight." Patient mumbling and laughing to self while sitting in hallway floor. Asking if he will be discharged. Pleasant and redirectable.

## 2018-10-02 NOTE — Progress Notes (Signed)
  [facility] Adult Case Management Discharge Plan :  Will you be returning to the same living situation after discharge:  Yes,  own home At discharge, do you have transportation home?: Yes,  mother Do you have the ability to pay for your medications: Yes,  medicare/medicaid  Release of information consent forms completed and in the chart;  Patient's signature needed at discharge.  Patient to Follow up at: Follow-up Information    [facility] Follow up on 10/05/2018.   Why:  Your ACT team will meet you on Monday, 10/05/18, at 1130am at your house to resume services.  Contact information: [address] [place] [address] [phone]           Next level of care provider has access to [facility] Link:no  Safety Planning and Suicide Prevention discussed: No. Pt declined consent     Has patient been referred to the Quitline?: Patient refused referral  Patient has been referred for addiction treatment: N/A  Lorri Frederick, LCSW 10/02/2018, 12:25 PM

## 2018-10-02 NOTE — Discharge Summary (Signed)
Physician Discharge Summary Note  Patient:  [name] is an 33 y.o., male MRN:  [id number] DOB:  [birth year] Patient phone:  [phone] (home)  Patient address:   [address] [place] [address],  Total Time spent with patient: 45 minutes  Date of Admission:  09/27/2018 Date of Discharge: 10/02/2018  Reason for Admission:    Mr. Liebmann is well-known to the examiner he is 33 years of age he is followed by [facility] act team but he had recently been incarcerated and there had been a lapse in his scheduled Prolixin decanoate medication.  This led to an exacerbation in his condition and he presented on a voluntary basis on 5/10 with a cluster of vague complaints but it was clear over time that he was actively hallucinating and he is in need of inpatient stabilization.  Again, when the patient is drug-free that is no illicit compounds and taking his Prolixin he is generally stable however again recent incarceration disrupted the pattern of needed long-acting injectable medications despite act team attempts to communicate with jail personnel and make sure he did not labs.  When I interviewed the patient he states he is hearing things but states "that is just part of my illness-I have that" So he minimizes the fact that he is actively hallucinating but was described as having conversations with no one in the room earlier in the admission process. He acknowledged visual hallucinations but would not elaborate what content might be  He denied wanting to harm self or others and could contract fully No EPS or TD.  Principal Problem: Schizoaffective disorder, bipolar type [facility]) Discharge Diagnoses: Principal Problem:   Schizoaffective disorder, bipolar type [facility])   Past Medical History:  Past Medical History:  Diagnosis Date  . Depression   . Hypertension   . Schizoaffective disorder (HCC)   . Schizophrenia [name])     Past Surgical History:  Procedure  Laterality Date  . BACK SURGERY     Family History:  Family History  Problem Relation Age of Onset  . Allergies Mother   . Asthma Mother    Family Psychiatric  History: neg Social History:  Social History   Substance and Sexual Activity  Alcohol Use Not Currently     Social History   Substance and Sexual Activity  Drug Use Not Currently   Comment: Previous drug use hx -- would not specify    Social History   Socioeconomic History  . Marital status: Single    Spouse name: Not on file  . Number of children: 0  . Years of education: Not on file  . Highest education level: Not on file  Occupational History  . Occupation: Disabled  Social Needs  . Financial resource strain: Not on file  . Food insecurity:    Worry: Not on file    Inability: Not on file  . Transportation needs:    Medical: Not on file    Non-medical: Not on file  Tobacco Use  . Smoking status: Current Every Day Smoker    Packs/day: 1.00    Years: 8.00    Pack years: 8.00    Types: Cigarettes, Pipe  . Smokeless tobacco: Current User  Substance and Sexual Activity  . Alcohol use: Not Currently  . Drug use: Not Currently    Comment: Previous drug use hx -- would not specify  . Sexual activity: Not Currently  Lifestyle  . Physical activity:    Days per week: Not on  file    Minutes per session: Not on file  . Stress: Not on file  Relationships  . Social connections:    Talks on phone: Not on file    Gets together: Not on file    Attends religious service: Not on file    Active member of club or organization: Not on file    Attends meetings of clubs or organizations: Not on file    Relationship status: Not on file  Other Topics Concern  . Not on file  Social History Narrative   Single. No children. Lives with two roommates.    Hospital Course:    Patient was admitted as discussed on a voluntary basis he complied with medications but early in his hospital stay continued to be talking to  himself responding to stimuli include he had a continuation of the psychotic symptoms listed earlier.  We escalated his oral Prolixin to a total of 15 mg twice a day which was further adjusted to the point of discharge to reduce sedation to be 10 mg in the morning and 20 at bedtime. I discussed with him newer options but he stated he liked Prolixin and simply wanted to stay without medication but he also felt that his long-acting injectable simply was not holding him so we escalated the dose from 37.5 to 50 mg IM.  He was insisted that he received 100 mg IM while incarcerated but we found no record of this in jail and further 100 mg to be simply too much Prolixin so I think he simply mistaken he may have received some type of injection but it was not on the record as a Prolixin shot.  At any rate he did improve but he signed a 72-hour request for discharge by the date of the 15th it was due and he did not meet commitment criteria he denied all positive symptoms he was no longer talking to himself he had no thoughts of harming self or others no EPS or TD so we did escalate his oral and IM Prolixin to the doses listed below he has no signs or symptoms of toxicity. QTC from January 418. Physical Findings: AIMS: Facial and Oral Movements Muscles of Facial Expression: None, normal Lips and Perioral Area: None, normal Jaw: None, normal Tongue: None, normal,Extremity Movements Upper (arms, wrists, hands, fingers): None, normal Lower (legs, knees, ankles, toes): None, normal, Trunk Movements Neck, shoulders, hips: None, normal, Overall Severity Severity of abnormal movements (highest score from questions above): None, normal Incapacitation due to abnormal movements: None, normal Patient's awareness of abnormal movements (rate only patient's report): No Awareness, Dental Status Current problems with teeth and/or dentures?: No Does patient usually wear dentures?: No  CIWA:  CIWA-Ar Total: 0 COWS:  COWS  Total Score: 0  Musculoskeletal: Strength & Muscle Tone: within normal limits Gait & Station: normal Patient leans: N/A  Psychiatric Specialty Exam: ROS  Blood pressure 131/80, pulse 89, temperature 98 F (36.7 C), temperature source Oral, resp. rate 18, SpO2 100 %.There is no height or weight on file to calculate BMI.  General Appearance: Casual  Eye Contact::  Fair  Speech:  Clear and Coherent409  Volume:  Normal  Mood:  Euthymic  Affect:  Constricted  Thought Process:  Goal Directed and Descriptions of Associations: Tangential  Orientation:  Full (Time, Place, and Person)  Thought Content:  Logical and Tangential  Suicidal Thoughts:  No  Homicidal Thoughts:  No  Memory:  Immediate;   Fair  Judgement:  Fair  Insight:  Fair  Psychomotor Activity:  Normal  Concentration:  Fair  Recall:  Good  Fund of Knowledge:Good  Language: Good  Akathisia:  Negative  Handed:  Right  AIMS (if indicated):     Assets:  Leisure Time Physical Health  Sleep:  Number of Hours: 6.25  Cognition: WNL  ADL's:  Intact         Has this patient used any form of tobacco in the last 30 days? (Cigarettes, Smokeless Tobacco, Cigars, and/or Pipes) Yes, No  Blood Alcohol level:  Lab Results  Component Value Date   ETH <10 11/15/2017   ETH <5 12/27/2015    Metabolic Disorder Labs:  Lab Results  Component Value Date   HGBA1C 6.2 (H) 09/28/2018   MPG 131.24 09/28/2018   Lab Results  Component Value Date   PROLACTIN 24.8 (H) 09/28/2018   PROLACTIN 23.0 (H) 06/10/2015   Lab Results  Component Value Date   CHOL 192 09/28/2018   TRIG 120 09/28/2018   HDL 37 (L) 09/28/2018   CHOLHDL 5.2 09/28/2018   VLDL 24 09/28/2018   LDLCALC 131 (H) 09/28/2018   LDLCALC 119 (H) 06/10/2015    See Psychiatric Specialty Exam and Suicide Risk Assessment completed by Attending Physician prior to discharge.  Discharge destination:  Home  Is patient on multiple antipsychotic therapies at discharge:   No   Has Patient had three or more failed trials of antipsychotic monotherapy by history:  No  Recommended Plan for Multiple Antipsychotic Therapies: NA  Discharge Instructions    Diet - low sodium heart healthy   Complete by:  As directed    Discharge instructions   Complete by:  As directed    Transfer to [facility] 500 hall   Increase activity slowly   Complete by:  As directed      Allergies as of 10/02/2018   No Known Allergies     Medication List    TAKE these medications     Indication  benztropine 1 MG tablet Commonly known as:  COGENTIN Take 1 tablet (1 mg total) by mouth 2 (two) times daily.  Indication:  Extrapyramidal Reaction caused by Medications   fluPHENAZine 10 MG tablet Commonly known as:  PROLIXIN 1 in am 2 at hs  Indication:  Psychosis, Schizophrenia   fluPHENAZine decanoate 25 MG/ML injection Commonly known as:  PROLIXIN Inject 2 mLs (50 mg total) into the muscle every 14 (fourteen) days. What changed:  how much to take  Indication:  Schizophrenia      Follow-up Information    [facility] Follow up.   Contact information: [address] [place] [address] [phone]          Signed: Malvin Johns, MD 10/02/2018, 10:28 AM

## 2018-10-02 NOTE — Progress Notes (Signed)
CSW made multiple phone calls to [facility] attempting to secure an appt time for the ACT team to meet pt after his discharge.  CSW was able to speak with one live person at 930am who said they would call back in 10-15 minutes but no return call was received and additional efforts to contact them were not successful, including contact work cell phone number. Garner Nash, MSW, LCSW Clinical Social Worker 10/02/2018 11:41 AM

## 2018-10-02 NOTE — [facility] Suicide Risk Assessment (Signed)
[facility] Discharge Suicide Risk Assessment   Principal Problem: Schizoaffective disorder, bipolar type [facility]) Discharge Diagnoses: Principal Problem:   Schizoaffective disorder, bipolar type (HCC)   Total Time spent with patient: 45 minutes  Musculoskeletal: Strength & Muscle Tone: within normal limits Gait & Station: normal Patient leans: N/A  Psychiatric Specialty Exam: ROS  Blood pressure 131/80, pulse 89, temperature 98 F (36.7 C), temperature source Oral, resp. rate 18, SpO2 100 %.There is no height or weight on file to calculate BMI.  General Appearance: Casual  Eye Contact::  Fair  Speech:  Clear and Coherent409  Volume:  Normal  Mood:  Euthymic  Affect:  Constricted  Thought Process:  Goal Directed and Descriptions of Associations: Tangential  Orientation:  Full (Time, Place, and Person)  Thought Content:  Logical and Tangential  Suicidal Thoughts:  No  Homicidal Thoughts:  No  Memory:  Immediate;   Fair  Judgement:  Fair  Insight:  Fair  Psychomotor Activity:  Normal  Concentration:  Fair  Recall:  Good  Fund of Knowledge:Good  Language: Good  Akathisia:  Negative  Handed:  Right  AIMS (if indicated):     Assets:  Leisure Time Physical Health  Sleep:  Number of Hours: 6.25  Cognition: WNL  ADL's:  Intact   Mental Status Per Nursing Assessment::   On Admission:  NA  Demographic Factors:  Caucasian  Loss Factors: Decrease in vocational status  Historical Factors: Impulsivity  Risk Reduction Factors:   Sense of responsibility to family and Religious beliefs about death  Continued Clinical Symptoms:  Schizophrenia:   Less than 75 years old  Cognitive Features That Contribute To Risk:  Loss of executive function    Suicide Risk:  Minimal: No identifiable suicidal ideation.  Patients presenting with no risk factors but with morbid ruminations; may be classified as minimal risk based on the severity of the depressive symptoms  Follow-up Information     [facility] Follow up.   Contact information: [address] [place] [address] [phone]           Plan Of Care/Follow-up recommendations:  Activity:  full  Klynn Linnemann, MD 10/02/2018, 10:22 AM

## 2018-10-02 NOTE — Progress Notes (Signed)
Psychoeducational Group Note  Date:  10/02/2018 Time:  2428  Group Topic/Focus:  Wrap-Up Group:   The focus of this group is to help patients review their daily goal of treatment and discuss progress on daily workbooks.  Participation Level: Did Not Attend  Participation Quality:  Not Applicable  Affect:  Not Applicable  Cognitive:  Not Applicable  Insight:  Not Applicable  Engagement in Group: Not Applicable  Additional Comments:  The patient did not attend group last evening.  Jacquelyn Shadrick S 10/02/2018, 12:28 AM

## 2018-10-02 NOTE — Plan of Care (Signed)
Patient was able to interact with peers and staff in a pro social manner at completion and recreation therapy group session.   Caroll Rancher, LRT/CTRS

## 2018-10-02 NOTE — Progress Notes (Signed)
D: Patient observed sleeping without difficulty since the start of this writer's shift (1900). NAD, no complaints voiced. COVID-19 screen negative. Respiratory assessment WDL.  A: Patient awakened for hs meds however refused. "I don't need any meds".  Level III obs in place for safety.  R: Patient remains safe on level III obs. Will continue to monitor throughout the night.

## 2018-10-02 NOTE — Progress Notes (Signed)
Recreation Therapy Notes  INPATIENT RECREATION TR PLAN  Patient Details Name: [name] MRN: [id number] DOB: 07/18/1985 Today's Date: 10/02/2018  Rec Therapy Plan Is patient appropriate for Therapeutic Recreation?: Yes Treatment times per week: about 3 days Estimated Length of Stay: 5-7 days TR Treatment/Interventions: Group participation (Comment)  Discharge Criteria Pt will be discharged from therapy if:: Discharged Treatment plan/goals/alternatives discussed and agreed upon by:: Patient/family  Discharge Summary Short term goals set: See patient care plan Short term goals met: Complete Progress toward goals comments: Groups attended Which groups?: Wellness, Goal setting, Other (Comment)(Team building) Reason goals not met: None Therapeutic equipment acquired: N/A Reason patient discharged from therapy: Discharge from hospital Pt/family agrees with progress & goals achieved: Yes Date patient discharged from therapy: 10/02/18    Victorino Sparrow, LRT/CTRS  [name], [facility] 10/02/2018, 12:26 PM

## 2018-10-02 NOTE — Progress Notes (Signed)
Pt discharged to lobby. Pt was stable and appreciative at that time. All papers and prescriptions were given and valuables returned. Verbal understanding expressed. Denies SI/HI and A/VH. Pt given opportunity to express concerns and ask questions.  

## 2018-10-02 NOTE — Progress Notes (Signed)
Recreation Therapy Notes  Date: 5.15.20 Time: 1000 Location: 500 Hall Dayroom  Group Topic: Communication, Team Building, Problem Solving  Goal Area(s) Addresses:  Patient will effectively work with peer towards shared goal.  Patient will identify skill used to make activity successful.  Patient will identify how skills used during activity can be used to reach post d/c goals.   Behavioral Response:  Engaged  Intervention: STEM Activity   Activity:  Straw Bridge.  Patients placed in groups of 2.  Each group was given 20 straws and a long piece of masking tape.  Each group was to build a freestanding bridge that could hold a small puzzle box.  Education: Pharmacist, community, Building control surveyor.   Education Outcome: Acknowledges education  Clinical Observations/Feedback:  Pt was appropriate in group but had a hard time focusing on the activity.  Pt was on task and pleasant.  Pt worked well with his peer.  Pt left early and did not return.    Caroll Rancher, LRT/CTRS     Caroll Rancher A 10/02/2018 12:13 PM

## 2018-11-05 DIAGNOSIS — F259 Schizoaffective disorder, unspecified: Secondary | ICD-10-CM | POA: Diagnosis present

## 2019-03-12 ENCOUNTER — Emergency Department (HOSPITAL_COMMUNITY): Payer: Medicare Other

## 2019-03-12 ENCOUNTER — Emergency Department (HOSPITAL_COMMUNITY)
Admission: EM | Admit: 2019-03-12 | Discharge: 2019-03-12 | Disposition: A | Discharge status: Home / Self Care | Payer: Medicare Other | Attending: Emergency Medicine | Admitting: Emergency Medicine

## 2019-03-12 ENCOUNTER — Other Ambulatory Visit: Payer: Self-pay

## 2019-03-12 DIAGNOSIS — R0602 Shortness of breath: Secondary | ICD-10-CM | POA: Diagnosis not present

## 2019-03-12 DIAGNOSIS — Z20828 Contact with and (suspected) exposure to other viral communicable diseases: Secondary | ICD-10-CM | POA: Insufficient documentation

## 2019-03-12 DIAGNOSIS — R404 Transient alteration of awareness: Secondary | ICD-10-CM | POA: Diagnosis not present

## 2019-03-12 DIAGNOSIS — Z03818 Encounter for observation for suspected exposure to other biological agents ruled out: Secondary | ICD-10-CM | POA: Diagnosis not present

## 2019-03-12 LAB — ETHANOL: Alcohol, Ethyl (B): <10 mg/dL (ref <10)

## 2019-03-12 LAB — CBC WITH DIFFERENTIAL/PLATELET
Abs Immature Granulocytes: 0.04 10*3/uL (ref 0.00–0.07)
Basophils Absolute: 0.1 10*3/uL (ref 0.0–0.1)
Basophils Relative: 1 %
Eosinophils Absolute: 0.2 10*3/uL (ref 0.0–0.5)
Eosinophils Relative: 2 %
HCT: 45.6 % (ref 39.0–52.0)
Hemoglobin: 15.7 g/dL (ref 13.0–17.0)
Immature Granulocytes: 0 %
Lymphocytes Relative: 17 %
Lymphs Abs: 1.7 10*3/uL (ref 0.7–4.0)
MCH: 29.5 pg (ref 26.0–34.0)
MCHC: 34.4 g/dL (ref 30.0–36.0)
MCV: 85.6 fL (ref 80.0–100.0)
Monocytes Absolute: 0.6 10*3/uL (ref 0.1–1.0)
Monocytes Relative: 6 %
Neutro Abs: 7.7 10*3/uL (ref 1.7–7.7)
Neutrophils Relative %: 74 %
Platelets: 283 10*3/uL (ref 150–400)
RBC: 5.33 MIL/uL (ref 4.22–5.81)
RDW: 11.7 % (ref 11.5–15.5)
WBC: 10.3 10*3/uL (ref 4.0–10.5)
nRBC: 0 % (ref 0.0–0.2)

## 2019-03-12 LAB — COMPREHENSIVE METABOLIC PANEL
ALT: 28 U/L (ref 0–44)
AST: 21 U/L (ref 15–41)
Albumin: 3.9 g/dL (ref 3.5–5.0)
Alkaline Phosphatase: 75 U/L (ref 38–126)
Anion gap: 9 (ref 5–15)
BUN: 10 mg/dL (ref 6–20)
CO2: 22 mmol/L (ref 22–32)
Calcium: 9.2 mg/dL (ref 8.9–10.3)
Chloride: 104 mmol/L (ref 98–111)
Creatinine, Ser: 1.06 mg/dL (ref 0.61–1.24)
GFR calc Af Amer: >60 mL/min (ref >60)
GFR calc non Af Amer: >60 mL/min (ref >60)
Glucose, Bld: 204 mg/dL — ABNORMAL HIGH (ref 70–99)
Potassium: 3.6 mmol/L (ref 3.5–5.1)
Sodium: 135 mmol/L (ref 135–145)
Total Bilirubin: 0.4 mg/dL (ref 0.3–1.2)
Total Protein: 7.4 g/dL (ref 6.5–8.1)

## 2019-03-12 LAB — CK: Total CK: 174 U/L (ref 49–397)

## 2019-03-12 MED ORDER — ACETAMINOPHEN 500 MG PO TABS
1000.0000 mg | ORAL_TABLET | Freq: Once | ORAL | Status: AC
  Administered 2019-03-12: 1000 mg via ORAL
  Filled 2019-03-12: qty 2

## 2019-03-12 NOTE — ED Notes (Signed)
ED Provider at bedside. 

## 2019-03-12 NOTE — Discharge Instructions (Addendum)
As discussed, your evaluation today has been largely reassuring.  But, it is important that you monitor your condition carefully, and do not hesitate to return to the ED if you develop new, or concerning changes in your condition.  It is very important you follow-up with your care management team at [facility] for ongoing discussion of today's evaluation.  A coronavirus test has been sent.  These results should be available within the next 48-72 hours.  Please use the provided log and information on your paperwork to obtain these results.

## 2019-03-12 NOTE — ED Notes (Signed)
An After Visit Summary was printed and given to the patient. Discharge instructions given and no further questions at this time. Pt left ambulatory with mother in car.

## 2019-03-12 NOTE — ED Triage Notes (Signed)
Pt arrived via EMS found in parking lot and [facility]) . Pt fnd supine on ground. Pt denies any specific complain or pain. Pt reports to EMS " I have been crashing a lot". Pt was able to ambulate. Pt reports that he lives in [place].   EMS v/s 144/92, HR 106, RR 20, O2 sat 94% RA.

## 2019-03-12 NOTE — ED Provider Notes (Signed)
[facility]-EMERGENCY DEPT Provider Note   CSN: [id number] Arrival date & time: 03/12/19  1326     History   Chief Complaint No chief complaint on file.   HPI [name] is a 33 y.o. male.     HPI Patient presents under unclear circumstances The patient himself states that he feels poorly in general, he feels as though his body is giving out. He cannot specify when he started feeling his way, or if he has any specific discomfort, or weakness. Patient is sleeping, but awakens easily, but falls asleep again quickly during the initial evaluation. Per report patient was found by EMS providers in the parking lot of a fast food restaurant no reported trauma, fall, or specific complaints at that point.  Past medical history obtained after chart merger. Schizophrenia Cocaine abuse Cannabis abuse  Home Medications    Patient unaware of his medications beyond by monthly steroid injection  Social History Currently lives with his mother, as he has been suspended from transitional housing History of cocaine use, cannabis use  Allergies   Patient has no known allergies.   Review of Systems Review of Systems  Unable to perform ROS: Mental status change  Review of systems unreliable secondary to the patient's sleeping status, brief periods of lucidity only, withdrawn presentation.   Physical Exam Updated Vital Signs BP 140/80   Pulse 97   Temp 98.1 F (36.7 C) (Oral)   Resp 18   SpO2 96%   Physical Exam Vitals signs and nursing note reviewed.  Constitutional:      General: He is not in acute distress.    Appearance: He is well-developed. He is not ill-appearing.     Comments: Patient sleeping, awakens easily, is in no distress  HENT:     Head: Normocephalic and atraumatic.  Eyes:     Conjunctiva/sclera: Conjunctivae normal.  Cardiovascular:     Rate and Rhythm: Normal rate and regular rhythm.  Pulmonary:     Effort: Pulmonary effort is  normal. No respiratory distress.     Breath sounds: No stridor.  Abdominal:     General: There is no distension.  Skin:    General: Skin is warm and dry.  Neurological:     Comments: No gross deficits, the patient does move all extremities minimally, spontaneously, but falls asleep again quickly, does not consistently follow commands, requiring additional stimuli to do so.  Psychiatric:        Behavior: Behavior is withdrawn.      ED Treatments / Results  Labs (all labs ordered are listed, but only abnormal results are displayed) Labs Reviewed  COMPREHENSIVE METABOLIC PANEL - Abnormal; Notable for the following components:      Result Value   Glucose, Bld 204 (*)    All other components within normal limits  SARS CORONAVIRUS 2 (TAT 6-24 HRS)  ETHANOL  CBC WITH DIFFERENTIAL/PLATELET  CK    Radiology Dg Chest Port 1 View  Result Date: 03/12/2019 CLINICAL DATA:  Shortness of breath EXAM: PORTABLE CHEST 1 VIEW COMPARISON:  None. FINDINGS: The cardiomediastinal silhouette is unremarkable. There is no evidence of focal airspace disease, pulmonary edema, suspicious pulmonary nodule/mass, pleural effusion, or pneumothorax. No acute bony abnormalities are identified. IMPRESSION: No active disease. Electronically Signed   By: Harmon Pier M.D.   On: 03/12/2019 14:50    Procedures Procedures (including critical care time)  Medications Ordered in ED Medications  acetaminophen (TYLENOL) tablet 1,000 mg (has no administration in time range)  Initial Impression / Assessment and Plan / ED Course  I have reviewed the triage vital signs and the nursing notes.  Pertinent labs & imaging results that were available during my care of the patient were reviewed by me and considered in my medical decision making (see chart for details).        3:33 PM Patient much more awake, alert. He now confirms that he is not 33 years old, as he was initially registered, and after charts emerged,  he has a history of schizophrenia, cannabis abuse, cocaine abuse. Discussed the patient with his mother. She notes that he is currently living with her, as he has been suspended from a transitional housing unit. She is unaware of why the patient was found sleeping in the parking lot of a fast food restaurant. Here he is awake, alert, hemodynamically unremarkable, has no ongoing complaints.  We discussed his ongoing care under [facility], and the patient will follow-up there in the coming days. Absent alarming physical exam findings, vital sign abnormalities, lab abnormalities with reassuring x-ray is appropriate for discharge. Per mother's request the patient has a Covid test pending.  Final Clinical Impressions(s) / ED Diagnoses   Final diagnoses:  Transient alteration of awareness     Carmin Muskrat, MD 03/12/19 1534

## 2019-03-13 LAB — SARS CORONAVIRUS 2 (TAT 6-24 HRS): SARS Coronavirus 2: NEGATIVE

## 2019-06-30 ENCOUNTER — Other Ambulatory Visit: Payer: Self-pay

## 2019-06-30 ENCOUNTER — Emergency Department (HOSPITAL_COMMUNITY)
Admission: EM | Admit: 2019-06-30 | Discharge: 2019-06-30 | Disposition: A | Discharge status: Home / Self Care | Payer: Medicare Other | Attending: Emergency Medicine | Admitting: Emergency Medicine

## 2019-06-30 DIAGNOSIS — I10 Essential (primary) hypertension: Secondary | ICD-10-CM | POA: Diagnosis not present

## 2019-06-30 DIAGNOSIS — R4020 Unspecified coma: Secondary | ICD-10-CM | POA: Diagnosis not present

## 2019-06-30 DIAGNOSIS — F259 Schizoaffective disorder, unspecified: Secondary | ICD-10-CM | POA: Insufficient documentation

## 2019-06-30 DIAGNOSIS — T401X1A Poisoning by heroin, accidental (unintentional), initial encounter: Secondary | ICD-10-CM | POA: Insufficient documentation

## 2019-06-30 DIAGNOSIS — R404 Transient alteration of awareness: Secondary | ICD-10-CM | POA: Diagnosis not present

## 2019-06-30 DIAGNOSIS — R0902 Hypoxemia: Secondary | ICD-10-CM | POA: Diagnosis not present

## 2019-06-30 DIAGNOSIS — T40601A Poisoning by unspecified narcotics, accidental (unintentional), initial encounter: Secondary | ICD-10-CM

## 2019-06-30 DIAGNOSIS — Z79899 Other long term (current) drug therapy: Secondary | ICD-10-CM | POA: Insufficient documentation

## 2019-06-30 DIAGNOSIS — R1110 Vomiting, unspecified: Secondary | ICD-10-CM | POA: Insufficient documentation

## 2019-06-30 DIAGNOSIS — R0602 Shortness of breath: Secondary | ICD-10-CM | POA: Diagnosis not present

## 2019-06-30 DIAGNOSIS — F17210 Nicotine dependence, cigarettes, uncomplicated: Secondary | ICD-10-CM | POA: Diagnosis not present

## 2019-06-30 DIAGNOSIS — T402X1A Poisoning by other opioids, accidental (unintentional), initial encounter: Secondary | ICD-10-CM | POA: Diagnosis not present

## 2019-06-30 DIAGNOSIS — R069 Unspecified abnormalities of breathing: Secondary | ICD-10-CM | POA: Diagnosis not present

## 2019-06-30 MED ORDER — ONDANSETRON HCL 4 MG/2ML IJ SOLN
4.0000 mg | Freq: Once | INTRAMUSCULAR | Status: AC
  Administered 2019-06-30: 4 mg via INTRAVENOUS
  Filled 2019-06-30: qty 2

## 2019-06-30 NOTE — ED Notes (Addendum)
Pt denies SI/HI at this time.  Will continue to monitor. Pt arrived and remains on NRB d/t poor O2 saturations on room air.   This RN observed pt O2 to be 79% on RA with good waveform on monitor.

## 2019-06-30 NOTE — Discharge Instructions (Signed)
Avoid drugs.  If you develop trouble breathing, fever or other concerns please return to the ER.

## 2019-06-30 NOTE — ED Notes (Signed)
Pt ambulatory with steady gait to bathroom, no assistance needed.

## 2019-06-30 NOTE — ED Notes (Signed)
Pt provided verbal consent to update mother, [name], on pt status.

## 2019-06-30 NOTE — ED Triage Notes (Signed)
Pt BIBA from boarding house.   Per EMS-  Roommate found pt unresponsive on bed appx 0920, agonal breathing, pin point pupils.  IN 1 mg narcan given initially, followed by 2 mg IN Narcan.  On EMS arrival pt still with agonal breaths, spO2 55% RA; NPA applied, BVM used.    151/100 BP CBG 91 SpO2 NRB 93% HR 100 18 g L FA. Pt alert on transport, slurred speech, attests to using heroine.

## 2019-06-30 NOTE — ED Notes (Signed)
Pt ambulated > 30 ft. With standby assist. Gait steady, no assistance needed. O2sat 89 - 89% during ambulation. O2sat remained the same after sitting down. RN aware.

## 2019-06-30 NOTE — ED Notes (Signed)
Pt ambulatory to bathroom with stand by assistance. Pt with unsteady gait. Will continue to monitor.

## 2019-06-30 NOTE — ED Notes (Signed)
Pt with one emesis occurrence.  EDP Plunkett made aware.

## 2019-06-30 NOTE — ED Notes (Signed)
Discharge paperwork reviewed with pt, pt with no questions or concerns at this time.  Pt ambulatory at time of discharge.

## 2019-06-30 NOTE — ED Provider Notes (Signed)
[facility] DEPT Provider Note   CSN: [id number] Arrival date & time: 06/30/19  1016     History Chief Complaint  Patient presents with  . Drug Overdose    Kacey Zimmers is a 34 y.o. male.  Patient is a 34 year old male with a history of depression, schizophrenia, hypertension who is currently living at a boardinghouse and EMS were called today when patient was found by his roommate unresponsive agonal he breathing with pinpoint pupils.  Patient reports that he did snort heroin today but he did not realize how strong it was.  EMS arrived patient's oxygen saturation was 55% on room air and they started to bag-valve-mask him and he was given Narcan.  Patient did wake up from sleep.  He denies any suicidal ideation and states he was just trying to get high.  He apologizes and states he did not realize the consequences of his actions.  He denies any chest pain, shortness of breath, abdominal pain.  He reports that he is sleepy.  The history is provided by the patient and the EMS personnel.       Past Medical History:  Diagnosis Date  . Depression   . Hypertension   . Schizoaffective disorder ([place])   . Schizophrenia [facility])     Patient Active Problem List   Diagnosis Date Noted  . Schizoaffective schizophrenia ([place]) 11/05/2018  . Schizophrenia, paranoid, chronic ([place]) 06/08/2015  . Schizoaffective disorder, bipolar type ([place]) 10/21/2014  . Tobacco use disorder 10/21/2014  . Cocaine use disorder, moderate, dependence ([place]) 10/21/2014  . Cannabis use disorder, moderate, dependence ([place]) 10/21/2014    Past Surgical History:  Procedure Laterality Date  . BACK SURGERY         Family History  Problem Relation Age of Onset  . Allergies Mother   . Asthma Mother     Social History   Tobacco Use  . Smoking status: Current Every Day Smoker    Packs/day: 1.00    Years: 8.00    Pack years: 8.00    Types: Cigarettes, Pipe  . Smokeless tobacco:  Current User  Substance Use Topics  . Alcohol use: Not Currently  . Drug use: Not Currently    Comment: Previous drug use hx -- would not specify    Home Medications Prior to Admission medications   Medication Sig Start Date End Date Taking? Authorizing Provider  benztropine (COGENTIN) 1 MG tablet Take 1 tablet (1 mg total) by mouth 2 (two) times daily. 09/28/18   Patrecia Pour, NP  diphenhydrAMINE HCl (SLEEP AID) 50 MG/30ML LIQD Take 30 mLs by mouth at bedtime.    [provider]  fluPHENAZine (PROLIXIN) 10 MG tablet 1 in am 2 at hs 10/02/18   Johnn Hai, MD  fluPHENAZine decanoate (PROLIXIN) 25 MG/ML injection Inject 2 mLs (50 mg total) into the muscle every 14 (fourteen) days. 10/02/18   Johnn Hai, MD    Allergies    Patient has no allergy information on record.  Review of Systems   Review of Systems  All other systems reviewed and are negative.   Physical Exam Updated Vital Signs BP (!) 128/109   Pulse 91   Temp 97.6 F (36.4 C) (Oral)   Resp 15   SpO2 92%   Physical Exam Vitals and nursing note reviewed.  Constitutional:      General: He is not in acute distress.    Appearance: Normal appearance. He is well-developed. He is obese.  HENT:  Head: Normocephalic and atraumatic.     Nose: Nose normal.     Mouth/Throat:     Mouth: Mucous membranes are moist.  Eyes:     Conjunctiva/sclera: Conjunctivae normal.     Pupils: Pupils are equal, round, and reactive to light.  Cardiovascular:     Rate and Rhythm: Normal rate and regular rhythm.     Heart sounds: No murmur.  Pulmonary:     Effort: Pulmonary effort is normal. No respiratory distress.     Breath sounds: Normal breath sounds. No wheezing or rales.  Abdominal:     General: There is no distension.     Palpations: Abdomen is soft.     Tenderness: There is no abdominal tenderness. There is no guarding or rebound.  Musculoskeletal:        General: No tenderness. Normal range of motion.      Cervical back: Normal range of motion and neck supple.  Skin:    General: Skin is warm and dry.     Findings: No erythema or rash.  Neurological:     General: No focal deficit present.     Mental Status: He is alert and oriented to person, place, and time. Mental status is at baseline.  Psychiatric:        Mood and Affect: Mood normal.        Behavior: Behavior normal.        Thought Content: Thought content normal.     ED Results / Procedures / Treatments   Labs (all labs ordered are listed, but only abnormal results are displayed) Labs Reviewed - No data to display  EKG EKG Interpretation  Date/Time:  Wednesday June 30 2019 10:28:43 EST Ventricular Rate:  92 PR Interval:    QRS Duration: 92 QT Interval:  340 QTC Calculation: 421 R Axis:   70 Text Interpretation: Sinus rhythm No significant change since last tracing Confirmed by Gwyneth Sprout ([id number]) on 06/30/2019 11:19:40 AM   Radiology No results found.  Procedures Procedures (including critical care time)  Medications Ordered in ED Medications  ondansetron (ZOFRAN) injection 4 mg (4 mg Intravenous Given 06/30/19 1102)    ED Course  I have reviewed the triage vital signs and the nursing notes.  Pertinent labs & imaging results that were available during my care of the patient were reviewed by me and considered in my medical decision making (see chart for details).    MDM Rules/Calculators/A&P                      Patient presenting today with an accidental overdose on opiates.  He states he snorted heroin.  This was not an attempt to hurt himself.  He initially was hypoxic with EMS at 55% but improved after Narcan.  Patient is slightly sleepy here but wakes up spontaneously and is moving around spontaneously.  He has had intermittent bouts of hypoxia most likely related to positioning but is currently on nasal cannula.  He has no other acute issues at this time.  He did have one episode of vomiting and  received Zofran.  We will continue to observe the patient until he is back to baseline.  No labs or imaging needed at this time.  EKG without acute finding.  1:10 PM Patient is now awake and at baseline.  After some deep breaths and good coughs his oxygen saturation is 95% off of oxygen.  Will monitor for another 30 minutes to ensure no further hypoxia but  otherwise feel patient will be clear for discharge.  3:07 PM Pt spontaneously able to get out of bed and walk to the bathroom without difficulty.  He denies any sx.  Eating and drinking and well appearing.  Sats remain in the 90's on RA and no problems with ambulating.  Lowest drop to 89%.  Pt appears to be clear for d/c.  Final Clinical Impression(s) / ED Diagnoses Final diagnoses:  Opiate overdose, accidental or unintentional, initial encounter [facility])    Rx / DC Orders ED Discharge Orders    None       Gwyneth Sprout, MD 06/30/19 [id number]

## 2019-07-08 ENCOUNTER — Emergency Department (HOSPITAL_COMMUNITY)
Admission: EM | Admit: 2019-07-08 | Discharge: 2019-07-08 | Disposition: A | Discharge status: Home / Self Care | Payer: Medicare Other | Attending: Emergency Medicine | Admitting: Emergency Medicine

## 2019-07-08 ENCOUNTER — Encounter (HOSPITAL_COMMUNITY): Payer: Self-pay | Admitting: Emergency Medicine

## 2019-07-08 ENCOUNTER — Other Ambulatory Visit: Payer: Self-pay

## 2019-07-08 DIAGNOSIS — F1193 Opioid use, unspecified with withdrawal: Secondary | ICD-10-CM | POA: Diagnosis not present

## 2019-07-08 DIAGNOSIS — F17210 Nicotine dependence, cigarettes, uncomplicated: Secondary | ICD-10-CM | POA: Insufficient documentation

## 2019-07-08 DIAGNOSIS — Z79899 Other long term (current) drug therapy: Secondary | ICD-10-CM | POA: Insufficient documentation

## 2019-07-08 DIAGNOSIS — R52 Pain, unspecified: Secondary | ICD-10-CM | POA: Diagnosis not present

## 2019-07-08 DIAGNOSIS — I10 Essential (primary) hypertension: Secondary | ICD-10-CM | POA: Insufficient documentation

## 2019-07-08 DIAGNOSIS — F1123 Opioid dependence with withdrawal: Secondary | ICD-10-CM

## 2019-07-08 DIAGNOSIS — M791 Myalgia, unspecified site: Secondary | ICD-10-CM | POA: Diagnosis present

## 2019-07-08 DIAGNOSIS — R5381 Other malaise: Secondary | ICD-10-CM | POA: Diagnosis not present

## 2019-07-08 LAB — BASIC METABOLIC PANEL
Anion gap: 9 (ref 5–15)
BUN: 17 mg/dL (ref 6–20)
CO2: 22 mmol/L (ref 22–32)
Calcium: 9.2 mg/dL (ref 8.9–10.3)
Chloride: 105 mmol/L (ref 98–111)
Creatinine, Ser: 0.95 mg/dL (ref 0.61–1.24)
GFR calc Af Amer: >60 mL/min (ref >60)
GFR calc non Af Amer: >60 mL/min (ref >60)
Glucose, Bld: 142 mg/dL — ABNORMAL HIGH (ref 70–99)
Potassium: 3.9 mmol/L (ref 3.5–5.1)
Sodium: 136 mmol/L (ref 135–145)

## 2019-07-08 LAB — CBC WITH DIFFERENTIAL/PLATELET
Abs Immature Granulocytes: 0.05 10*3/uL (ref 0.00–0.07)
Basophils Absolute: 0.1 10*3/uL (ref 0.0–0.1)
Basophils Relative: 0 %
Eosinophils Absolute: 0.4 10*3/uL (ref 0.0–0.5)
Eosinophils Relative: 3 %
HCT: 45.4 % (ref 39.0–52.0)
Hemoglobin: 16 g/dL (ref 13.0–17.0)
Immature Granulocytes: 0 %
Lymphocytes Relative: 36 %
Lymphs Abs: 4.4 10*3/uL — ABNORMAL HIGH (ref 0.7–4.0)
MCH: 30.2 pg (ref 26.0–34.0)
MCHC: 35.2 g/dL (ref 30.0–36.0)
MCV: 85.7 fL (ref 80.0–100.0)
Monocytes Absolute: 1 10*3/uL (ref 0.1–1.0)
Monocytes Relative: 8 %
Neutro Abs: 6.5 10*3/uL (ref 1.7–7.7)
Neutrophils Relative %: 53 %
Platelets: 316 10*3/uL (ref 150–400)
RBC: 5.3 MIL/uL (ref 4.22–5.81)
RDW: 11.8 % (ref 11.5–15.5)
WBC: 12.4 10*3/uL — ABNORMAL HIGH (ref 4.0–10.5)
nRBC: 0 % (ref 0.0–0.2)

## 2019-07-08 MED ORDER — METHOCARBAMOL 500 MG PO TABS: 500.0000 mg | ORAL_TABLET | Freq: Two times a day (BID) | ORAL | 0 refills | Status: DC

## 2019-07-08 MED ORDER — KETOROLAC TROMETHAMINE 30 MG/ML IJ SOLN
30.0000 mg | Freq: Once | INTRAMUSCULAR | Status: AC
  Administered 2019-07-08: 21:00:00 30 mg via INTRAVENOUS
  Filled 2019-07-08: qty 1

## 2019-07-08 MED ORDER — SODIUM CHLORIDE 0.9 % IV BOLUS
1000.0000 mL | Freq: Once | INTRAVENOUS | Status: AC
  Administered 2019-07-08: 19:00:00 1000 mL via INTRAVENOUS

## 2019-07-08 MED ORDER — ONDANSETRON 4 MG PO TBDP: 4.0000 mg | ORAL_TABLET | Freq: Three times a day (TID) | ORAL | 0 refills | Status: DC | PRN

## 2019-07-08 MED ORDER — NAPROXEN 500 MG PO TABS: 500.0000 mg | ORAL_TABLET | Freq: Two times a day (BID) | ORAL | 0 refills | Status: DC

## 2019-07-08 MED ORDER — ONDANSETRON HCL 4 MG/2ML IJ SOLN
4.0000 mg | Freq: Once | INTRAMUSCULAR | Status: AC
  Administered 2019-07-08: 21:00:00 4 mg via INTRAVENOUS
  Filled 2019-07-08: qty 2

## 2019-07-08 NOTE — ED Triage Notes (Signed)
Per [facility] pt from home. 2 days ago patient OD on heroin. Since then having generalized pains and withdrawal symptoms.  Uses heroin frequently but not everyday. Does smoke marijuana daily and is nauseated.  Vitals: 134/76, 88HR, 18R, 97% on RA. CBG 171.

## 2019-07-08 NOTE — ED Triage Notes (Signed)
Pt adds that his pains are so bad that are keeping from getting out of the bed.

## 2019-07-08 NOTE — ED Provider Notes (Signed)
[facility] DEPT Provider Note   CSN: [id number] Arrival date & time: 07/08/19  1833     History Chief Complaint  Patient presents with  . Generalized Body Aches  . Nausea    [name] is a 34 y.o. male with a past medical history of hypertension, schizophrenia, substance abuse with overdose on heroin on 06/30/2019 treated with Narcan presenting to the ED for generalized body aches and nausea.  States that he feels like his symptoms are due to withdrawal from heroin.  States that he has not used heroin since he overdosed last week.  He continues to endorse daily marijuana use.  He has been taking Advil with only minimal improvement in his body aches.  States that he has trouble sleeping due to his body aches and feeling like "my body is tightening up."  Denies any focal chest or abdominal discomfort.  Denies any diarrhea, vomiting, fever, shortness of breath, cough.  Denies any alcohol or cocaine use.  HPI     Past Medical History:  Diagnosis Date  . Depression   . Hypertension   . Schizoaffective disorder ([place])   . Schizophrenia [facility])     Patient Active Problem List   Diagnosis Date Noted  . Schizoaffective schizophrenia ([place]) 11/05/2018  . Schizophrenia, paranoid, chronic ([place]) 06/08/2015  . Schizoaffective disorder, bipolar type ([place]) 10/21/2014  . Tobacco use disorder 10/21/2014  . Cocaine use disorder, moderate, dependence ([place]) 10/21/2014  . Cannabis use disorder, moderate, dependence ([place]) 10/21/2014    Past Surgical History:  Procedure Laterality Date  . BACK SURGERY         Family History  Problem Relation Age of Onset  . Allergies Mother   . Asthma Mother     Social History   Tobacco Use  . Smoking status: Current Every Day Smoker    Packs/day: 1.00    Years: 8.00    Pack years: 8.00    Types: Cigarettes, Pipe  . Smokeless tobacco: Current User  Substance Use Topics  . Alcohol use: Not Currently  . Drug use:  Yes    Types: Marijuana    Comment: heroin    Home Medications Prior to Admission medications   Medication Sig Start Date End Date Taking? Authorizing Provider  benztropine (COGENTIN) 1 MG tablet Take 1 tablet (1 mg total) by mouth 2 (two) times daily. Patient not taking: Reported on 06/30/2019 09/28/18   Patrecia Pour, NP  fluPHENAZine (PROLIXIN) 10 MG tablet 1 in am 2 at hs Patient not taking: Reported on 06/30/2019 10/02/18   Johnn Hai, MD  fluPHENAZine decanoate (PROLIXIN) 25 MG/ML injection Inject 2 mLs (50 mg total) into the muscle every 14 (fourteen) days. 10/02/18   Johnn Hai, MD  methocarbamol (ROBAXIN) 500 MG tablet Take 1 tablet (500 mg total) by mouth 2 (two) times daily. 07/08/19   Catie Chiao, PA-C  naproxen (NAPROSYN) 500 MG tablet Take 1 tablet (500 mg total) by mouth 2 (two) times daily. 07/08/19   Chianti Goh, PA-C  ondansetron (ZOFRAN ODT) 4 MG disintegrating tablet Take 1 tablet (4 mg total) by mouth every 8 (eight) hours as needed for nausea or vomiting. 07/08/19   Delia Heady, PA-C    Allergies    Patient has no known allergies.  Review of Systems   Review of Systems  Constitutional: Negative for appetite change, chills and fever.  HENT: Negative for ear pain, rhinorrhea, sneezing and sore throat.   Eyes: Negative for photophobia and visual disturbance.  Respiratory: Negative for cough, chest tightness, shortness of breath and wheezing.   Cardiovascular: Negative for chest pain and palpitations.  Gastrointestinal: Positive for nausea. Negative for abdominal pain, blood in stool, constipation, diarrhea and vomiting.  Genitourinary: Negative for dysuria, hematuria and urgency.  Musculoskeletal: Positive for myalgias.  Skin: Negative for rash.  Neurological: Negative for dizziness, weakness and light-headedness.    Physical Exam Updated Vital Signs BP 133/90   Pulse 80   Temp 98.8 F (37.1 C) (Oral)   Resp 16   SpO2 99%   Physical Exam Vitals and  nursing note reviewed.  Constitutional:      General: He is not in acute distress.    Appearance: He is well-developed.     Comments: Nontoxic appearing, no acute distress.  Ambulatory without difficulty.  HENT:     Head: Normocephalic and atraumatic.     Nose: Nose normal.  Eyes:     General: No scleral icterus.       Right eye: No discharge.        Left eye: No discharge.     Conjunctiva/sclera: Conjunctivae normal.  Cardiovascular:     Rate and Rhythm: Normal rate and regular rhythm.     Heart sounds: Normal heart sounds. No murmur. No friction rub. No gallop.   Pulmonary:     Effort: Pulmonary effort is normal. No respiratory distress.     Breath sounds: Normal breath sounds.  Abdominal:     General: Bowel sounds are normal. There is no distension.     Palpations: Abdomen is soft.     Tenderness: There is no abdominal tenderness. There is no guarding.  Musculoskeletal:        General: Normal range of motion.     Cervical back: Normal range of motion and neck supple.  Skin:    General: Skin is warm and dry.     Findings: No rash.  Neurological:     General: No focal deficit present.     Mental Status: He is alert.     Cranial Nerves: No cranial nerve deficit.     Motor: No abnormal muscle tone.     Coordination: Coordination normal.     Gait: Gait normal.     ED Results / Procedures / Treatments   Labs (all labs ordered are listed, but only abnormal results are displayed) Labs Reviewed  BASIC METABOLIC PANEL - Abnormal; Notable for the following components:      Result Value   Glucose, Bld 142 (*)    All other components within normal limits  CBC WITH DIFFERENTIAL/PLATELET - Abnormal; Notable for the following components:   WBC 12.4 (*)    Lymphs Abs 4.4 (*)    All other components within normal limits    EKG None  Radiology No results found.  Procedures Procedures (including critical care time)  Medications Ordered in ED Medications  sodium chloride  0.9 % bolus 1,000 mL (1,000 mLs Intravenous New Bag/Given 07/08/19 1927)  ketorolac (TORADOL) 30 MG/ML injection 30 mg (30 mg Intravenous Given 07/08/19 2033)  ondansetron (ZOFRAN) injection 4 mg (4 mg Intravenous Given 07/08/19 2033)    ED Course  I have reviewed the triage vital signs and the nursing notes.  Pertinent labs & imaging results that were available during my care of the patient were reviewed by me and considered in my medical decision making (see chart for details).    MDM Rules/Calculators/A&P  34 year old male with a past medical history of substance abuse presenting to the ED with a chief complaint of generalized body aches and nausea.  Patient seen and evaluated for heroin overdose on 06/30/2019 and believes that he is having withdrawal symptoms from his last heroin use that day.  Complains of pain throughout his body as well as nausea but denies any vomiting, specific chest pain, abdominal pain or changes to bowel movements.  On my exam abdomen is soft, nontender nondistended.  His vital signs are within normal limits, he is not tachycardic, tachypneic or hypoxic.  Lab work here including BMP, CBC unremarkable. He was given IV fluids, Toradol and Zofran here with significant improvement in his symptoms.  He is resting comfortably without any further complaints. Suspect withdrawal symptoms vs viral illness.  Will give antiemetics, muscle relaxer and NSAIDs to control symptoms.  Patient educated on increasing PO hydration and given return precautions for worsening symptoms.  Patient is hemodynamically stable, in NAD, and able to ambulate in the ED. Evaluation does not show pathology that would require ongoing emergent intervention or inpatient treatment. I have personally reviewed and interpreted all lab work and imaging at today's ED visit. I explained the diagnosis to the patient. Pain has been managed and has no complaints prior to discharge. Patient is comfortable  with above plan and is stable for discharge at this time. All questions were answered prior to disposition. Strict return precautions for returning to the ED were discussed. Encouraged follow up with PCP.   An After Visit Summary was printed and given to the patient.   Portions of this note were generated with Scientist, clinical (histocompatibility and immunogenetics). Dictation errors may occur despite best attempts at proofreading.  Final Clinical Impression(s) / ED Diagnoses Final diagnoses:  Opioid withdrawal (HCC)    Rx / DC Orders ED Discharge Orders         Ordered    ondansetron (ZOFRAN ODT) 4 MG disintegrating tablet  Every 8 hours PRN     07/08/19 2054    naproxen (NAPROSYN) 500 MG tablet  2 times daily     07/08/19 2054    methocarbamol (ROBAXIN) 500 MG tablet  2 times daily     07/08/19 2054           Dietrich Pates, PA-C 07/08/19 2057    Gerhard Munch, MD 07/08/19 2214

## 2019-07-08 NOTE — Discharge Instructions (Signed)
Take the medications as needed to help with your symptoms. You will need to return to the ER if you start to develop chest pain, shortness of breath, inability tolerate anything by mouth, lightheadedness or loss of consciousness.

## 2019-07-09 ENCOUNTER — Emergency Department (HOSPITAL_COMMUNITY)
Admission: EM | Admit: 2019-07-09 | Discharge: 2019-07-10 | Disposition: A | Discharge status: Home / Self Care | Payer: Medicare Other | Attending: Emergency Medicine | Admitting: Emergency Medicine

## 2019-07-09 DIAGNOSIS — F259 Schizoaffective disorder, unspecified: Secondary | ICD-10-CM | POA: Insufficient documentation

## 2019-07-09 DIAGNOSIS — F1210 Cannabis abuse, uncomplicated: Secondary | ICD-10-CM | POA: Insufficient documentation

## 2019-07-09 DIAGNOSIS — R45851 Suicidal ideations: Secondary | ICD-10-CM | POA: Insufficient documentation

## 2019-07-09 DIAGNOSIS — F1123 Opioid dependence with withdrawal: Secondary | ICD-10-CM

## 2019-07-09 DIAGNOSIS — F17220 Nicotine dependence, chewing tobacco, uncomplicated: Secondary | ICD-10-CM | POA: Insufficient documentation

## 2019-07-09 DIAGNOSIS — I10 Essential (primary) hypertension: Secondary | ICD-10-CM | POA: Insufficient documentation

## 2019-07-09 DIAGNOSIS — F200 Paranoid schizophrenia: Secondary | ICD-10-CM | POA: Diagnosis present

## 2019-07-09 DIAGNOSIS — F17210 Nicotine dependence, cigarettes, uncomplicated: Secondary | ICD-10-CM | POA: Insufficient documentation

## 2019-07-09 DIAGNOSIS — F1193 Opioid use, unspecified with withdrawal: Secondary | ICD-10-CM

## 2019-07-09 DIAGNOSIS — Z79899 Other long term (current) drug therapy: Secondary | ICD-10-CM | POA: Diagnosis not present

## 2019-07-09 DIAGNOSIS — Z20822 Contact with and (suspected) exposure to covid-19: Secondary | ICD-10-CM | POA: Diagnosis not present

## 2019-07-09 DIAGNOSIS — F1994 Other psychoactive substance use, unspecified with psychoactive substance-induced mood disorder: Secondary | ICD-10-CM | POA: Diagnosis not present

## 2019-07-09 DIAGNOSIS — F1120 Opioid dependence, uncomplicated: Secondary | ICD-10-CM | POA: Diagnosis not present

## 2019-07-09 DIAGNOSIS — F17290 Nicotine dependence, other tobacco product, uncomplicated: Secondary | ICD-10-CM | POA: Insufficient documentation

## 2019-07-09 DIAGNOSIS — F1910 Other psychoactive substance abuse, uncomplicated: Secondary | ICD-10-CM

## 2019-07-09 LAB — CBC WITH DIFFERENTIAL/PLATELET
Abs Immature Granulocytes: 0.07 10*3/uL (ref 0.00–0.07)
Basophils Absolute: 0.1 10*3/uL (ref 0.0–0.1)
Basophils Relative: 0 %
Eosinophils Absolute: 0.2 10*3/uL (ref 0.0–0.5)
Eosinophils Relative: 2 %
HCT: 44.9 % (ref 39.0–52.0)
Hemoglobin: 15.5 g/dL (ref 13.0–17.0)
Immature Granulocytes: 1 %
Lymphocytes Relative: 21 %
Lymphs Abs: 2.7 10*3/uL (ref 0.7–4.0)
MCH: 29.7 pg (ref 26.0–34.0)
MCHC: 34.5 g/dL (ref 30.0–36.0)
MCV: 86 fL (ref 80.0–100.0)
Monocytes Absolute: 0.9 10*3/uL (ref 0.1–1.0)
Monocytes Relative: 7 %
Neutro Abs: 9.1 10*3/uL — ABNORMAL HIGH (ref 1.7–7.7)
Neutrophils Relative %: 69 %
Platelets: 275 10*3/uL (ref 150–400)
RBC: 5.22 MIL/uL (ref 4.22–5.81)
RDW: 11.8 % (ref 11.5–15.5)
WBC: 12.9 10*3/uL — ABNORMAL HIGH (ref 4.0–10.5)
nRBC: 0 % (ref 0.0–0.2)

## 2019-07-09 LAB — COMPREHENSIVE METABOLIC PANEL
ALT: 29 U/L (ref 0–44)
AST: 22 U/L (ref 15–41)
Albumin: 3.9 g/dL (ref 3.5–5.0)
Alkaline Phosphatase: 65 U/L (ref 38–126)
Anion gap: 10 (ref 5–15)
BUN: 11 mg/dL (ref 6–20)
CO2: 24 mmol/L (ref 22–32)
Calcium: 8.9 mg/dL (ref 8.9–10.3)
Chloride: 104 mmol/L (ref 98–111)
Creatinine, Ser: 0.97 mg/dL (ref 0.61–1.24)
GFR calc Af Amer: >60 mL/min (ref >60)
GFR calc non Af Amer: >60 mL/min (ref >60)
Glucose, Bld: 134 mg/dL — ABNORMAL HIGH (ref 70–99)
Potassium: 4.4 mmol/L (ref 3.5–5.1)
Sodium: 138 mmol/L (ref 135–145)
Total Bilirubin: 0.6 mg/dL (ref 0.3–1.2)
Total Protein: 7.3 g/dL (ref 6.5–8.1)

## 2019-07-09 LAB — RESPIRATORY PANEL BY RT PCR (FLU A&B, COVID)
Influenza A by PCR: NEGATIVE
Influenza B by PCR: NEGATIVE
SARS Coronavirus 2 by RT PCR: NEGATIVE

## 2019-07-09 LAB — ETHANOL: Alcohol, Ethyl (B): <10 mg/dL (ref <10)

## 2019-07-09 MED ORDER — CLONIDINE HCL 0.1 MG PO TABS: 0.1000 mg | ORAL_TABLET | Freq: Every day | ORAL | Status: DC

## 2019-07-09 MED ORDER — NAPROXEN 500 MG PO TABS: 500.0000 mg | ORAL_TABLET | Freq: Two times a day (BID) | ORAL | Status: DC | PRN

## 2019-07-09 MED ORDER — HYDROXYZINE HCL 25 MG PO TABS: 25.0000 mg | ORAL_TABLET | Freq: Four times a day (QID) | ORAL | Status: DC | PRN

## 2019-07-09 MED ORDER — CLONIDINE HCL 0.1 MG PO TABS
0.1000 mg | ORAL_TABLET | Freq: Four times a day (QID) | ORAL | Status: DC
  Administered 2019-07-09 – 2019-07-10 (×3): 0.1 mg via ORAL
  Filled 2019-07-09 (×3): qty 1

## 2019-07-09 MED ORDER — CLONIDINE HCL 0.1 MG PO TABS: 0.1000 mg | ORAL_TABLET | Freq: Two times a day (BID) | ORAL | Status: DC

## 2019-07-09 MED ORDER — ONDANSETRON 4 MG PO TBDP: 4.0000 mg | ORAL_TABLET | Freq: Four times a day (QID) | ORAL | Status: DC | PRN

## 2019-07-09 MED ORDER — METHOCARBAMOL 500 MG PO TABS: 500.0000 mg | ORAL_TABLET | Freq: Three times a day (TID) | ORAL | Status: DC | PRN

## 2019-07-09 MED ORDER — LOPERAMIDE HCL 2 MG PO CAPS: 2.0000 mg | ORAL_CAPSULE | ORAL | Status: DC | PRN

## 2019-07-09 MED ORDER — DICYCLOMINE HCL 20 MG PO TABS: 20.0000 mg | ORAL_TABLET | Freq: Four times a day (QID) | ORAL | Status: DC | PRN

## 2019-07-09 NOTE — Progress Notes (Signed)
Received [name] at the change of shift asleep in bed with the sitter at the bedside. He woke up and ate his dinner and returned to bed. He was compliant with his medication. He slept throughout the night without incident.

## 2019-07-09 NOTE — ED Provider Notes (Signed)
[facility]-EMERGENCY DEPT Provider Note   CSN: [id number] Arrival date & time: 07/09/19  1730     History Chief Complaint  Patient presents with  . Medical Clearance    IVC    [name] is a 34 y.o. male.  He has a history of schizophrenia schizoaffective disorder polysubstance abuse.  He has been seen in the ED multiple times for overdoses and drug withdrawal.  Today his nurse came out to get him Prolixin and found him possibly impaired.  She filed a IVC and the patient was brought to [facility] by [facility].  He is vomited a few times and states he is going through withdrawal.  Denies any other medical complaints.  When I asked him what drugs he uses he says whatever he can find.  Denies any IV drugs.  The history is provided by the patient.  Mental Health Problem Presenting symptoms: suicidal thoughts and suicidal threats   Patient accompanied by:  Law enforcement Degree of incapacity (severity):  Unable to specify Onset quality:  Unable to specify Timing:  Unable to specify Progression:  Worsening Chronicity:  Recurrent Context: drug abuse   Relieved by:  Nothing Worsened by:  Drugs Ineffective treatments:  None tried Associated symptoms: no abdominal pain, no chest pain and no headaches   Risk factors: hx of mental illness        Past Medical History:  Diagnosis Date  . Depression   . Hypertension   . Schizoaffective disorder (HCC)   . Schizophrenia [facility] [place])     Patient Active Problem List   Diagnosis Date Noted  . Schizoaffective schizophrenia (HCC) 11/05/2018  . Schizophrenia, paranoid, chronic (HCC) 06/08/2015  . Schizoaffective disorder, bipolar type (HCC) 10/21/2014  . Tobacco use disorder 10/21/2014  . Cocaine use disorder, moderate, dependence (HCC) 10/21/2014  . Cannabis use disorder, moderate, dependence (HCC) 10/21/2014    Past Surgical History:  Procedure Laterality Date  . BACK SURGERY         Family History  Problem  Relation Age of Onset  . Allergies Mother   . Asthma Mother     Social History   Tobacco Use  . Smoking status: Current Every Day Smoker    Packs/day: 1.00    Years: 8.00    Pack years: 8.00    Types: Cigarettes, Pipe  . Smokeless tobacco: Current User  Substance Use Topics  . Alcohol use: Not Currently  . Drug use: Yes    Types: Marijuana    Comment: heroin    Home Medications Prior to Admission medications   Medication Sig Start Date End Date Taking? Authorizing Provider  benztropine (COGENTIN) 1 MG tablet Take 1 tablet (1 mg total) by mouth 2 (two) times daily. Patient not taking: Reported on 06/30/2019 09/28/18   Charm Rings, NP  fluPHENAZine (PROLIXIN) 10 MG tablet 1 in am 2 at hs Patient not taking: Reported on 06/30/2019 10/02/18   Malvin Johns, MD  fluPHENAZine decanoate (PROLIXIN) 25 MG/ML injection Inject 2 mLs (50 mg total) into the muscle every 14 (fourteen) days. 10/02/18   Malvin Johns, MD  methocarbamol (ROBAXIN) 500 MG tablet Take 1 tablet (500 mg total) by mouth 2 (two) times daily. 07/08/19   Khatri, Hina, PA-C  naproxen (NAPROSYN) 500 MG tablet Take 1 tablet (500 mg total) by mouth 2 (two) times daily. 07/08/19   Khatri, Hina, PA-C  ondansetron (ZOFRAN ODT) 4 MG disintegrating tablet Take 1 tablet (4 mg total) by mouth every 8 (eight)  hours as needed for nausea or vomiting. 07/08/19   Delia Heady, PA-C    Allergies    Patient has no known allergies.  Review of Systems   Review of Systems  Constitutional: Negative for fever.  HENT: Negative for sore throat.   Eyes: Negative for visual disturbance.  Respiratory: Negative for shortness of breath.   Cardiovascular: Negative for chest pain.  Gastrointestinal: Positive for nausea and vomiting. Negative for abdominal pain.  Genitourinary: Negative for dysuria.  Musculoskeletal: Negative for neck pain.  Skin: Negative for rash.  Neurological: Negative for headaches.  Psychiatric/Behavioral: Positive for  suicidal ideas.    Physical Exam Updated Vital Signs BP 125/66 (BP Location: Right Arm)   Pulse 78   Temp (!) 97.3 F (36.3 C) (Oral)   Resp 19   SpO2 94%   Physical Exam Vitals and nursing note reviewed.  Constitutional:      Appearance: He is well-developed.  HENT:     Head: Normocephalic and atraumatic.  Eyes:     Conjunctiva/sclera: Conjunctivae normal.  Cardiovascular:     Rate and Rhythm: Normal rate and regular rhythm.     Heart sounds: No murmur.  Pulmonary:     Effort: Pulmonary effort is normal. No respiratory distress.     Breath sounds: Normal breath sounds.  Abdominal:     Palpations: Abdomen is soft.     Tenderness: There is no abdominal tenderness. There is no guarding or rebound.  Musculoskeletal:        General: No deformity or signs of injury. Normal range of motion.     Cervical back: Neck supple.  Skin:    General: Skin is warm and dry.     Capillary Refill: Capillary refill takes less than 2 seconds.  Neurological:     General: No focal deficit present.     Mental Status: He is alert.     ED Results / Procedures / Treatments   Labs (all labs ordered are listed, but only abnormal results are displayed) Labs Reviewed  COMPREHENSIVE METABOLIC PANEL - Abnormal; Notable for the following components:      Result Value   Glucose, Bld 134 (*)    All other components within normal limits  CBC WITH DIFFERENTIAL/PLATELET - Abnormal; Notable for the following components:   WBC 12.9 (*)    Neutro Abs 9.1 (*)    All other components within normal limits  RESPIRATORY PANEL BY RT PCR (FLU A&B, COVID)  ETHANOL  RAPID URINE DRUG SCREEN, HOSP PERFORMED    EKG EKG Interpretation  Date/Time:  Friday July 09 2019 18:14:46 EST Ventricular Rate:  73 PR Interval:  168 QRS Duration: 100 QT Interval:  358 QTC Calculation: 394 R Axis:   72 Text Interpretation: Sinus rhythm with occasional Premature ventricular complexes Otherwise normal ECG No  significant change since prior 2/21 Confirmed by Aletta Edouard [phone]) on 07/09/2019 6:17:27 PM   Radiology No results found.  Procedures Procedures (including critical care time)  Medications Ordered in ED Medications  cloNIDine (CATAPRES) tablet 0.1 mg (has no administration in time range)    Followed by  cloNIDine (CATAPRES) tablet 0.1 mg (has no administration in time range)    Followed by  cloNIDine (CATAPRES) tablet 0.1 mg (has no administration in time range)  dicyclomine (BENTYL) tablet 20 mg (has no administration in time range)  hydrOXYzine (ATARAX/VISTARIL) tablet 25 mg (has no administration in time range)  loperamide (IMODIUM) capsule 2-4 mg (has no administration in time range)  methocarbamol (  ROBAXIN) tablet 500 mg (has no administration in time range)  naproxen (NAPROSYN) tablet 500 mg (has no administration in time range)  ondansetron (ZOFRAN-ODT) disintegrating tablet 4 mg (has no administration in time range)    ED Course  I have reviewed the triage vital signs and the nursing notes.  Pertinent labs & imaging results that were available during my care of the patient were reviewed by me and considered in my medical decision making (see chart for details).  Clinical Course as of Jul 09 1008  Fri Jul 09, 2019  1903 Differential includes opiate withdrawal, opiate intoxication, schizophrenia, suicidal ideation.  Him on the COWS protocol.  Lab work notable for an elevated white count of 12.9 and elevated glucose of 134.   [MB]    Clinical Course User Index [MB] Terrilee Files, MD   MDM Rules/Calculators/A&P                     The patient has been placed in psychiatric observation due to the need to provide a safe environment for the patient while obtaining psychiatric consultation and evaluation, as well as ongoing medical and medication management to treat the patient's condition.  The patient has been placed under full IVC at this time.   Final Clinical  Impression(s) / ED Diagnoses Final diagnoses:  Polysubstance abuse (HCC)  Opiate withdrawal (HCC)  Suicidal ideation    Rx / DC Orders ED Discharge Orders    None       Terrilee Files, MD 07/10/19 1010

## 2019-07-09 NOTE — ED Triage Notes (Signed)
[facility] by [facility]. Pt IVCd. Pt was going to go to [facility] but vomited. Pt has a hx of drug use and overdoses. Pt states going through withdrawals. Pt has hx of schizoeffective  . Pt stated want to use drugs till he died.  Person that IVCd pt stated came to his home to given him medication and he was high on heroin .  Pt dressed out , resting in bed.

## 2019-07-10 DIAGNOSIS — F1994 Other psychoactive substance use, unspecified with psychoactive substance-induced mood disorder: Secondary | ICD-10-CM

## 2019-07-10 DIAGNOSIS — F259 Schizoaffective disorder, unspecified: Secondary | ICD-10-CM | POA: Diagnosis not present

## 2019-07-10 MED ORDER — ONDANSETRON 4 MG PO TBDP: 4.0000 mg | ORAL_TABLET | Freq: Four times a day (QID) | ORAL | 0 refills | Status: DC | PRN

## 2019-07-10 MED ORDER — METHOCARBAMOL 500 MG PO TABS: 500.0000 mg | ORAL_TABLET | Freq: Three times a day (TID) | ORAL | Status: DC | PRN

## 2019-07-10 MED ORDER — NAPROXEN 500 MG PO TABS: 500.0000 mg | ORAL_TABLET | Freq: Two times a day (BID) | ORAL | Status: DC | PRN

## 2019-07-10 NOTE — ED Notes (Signed)
Pt discharged home. Discharged instructions read to pt who verbalized understanding. All belongings returned to pt who signed for same. Denies SI/HI, is not delusional and not responding to internal stimuli. Escorted pt to the ED exit.   Pt's IVC was rescinded by Dr. Effie Shy and given to secretary Clydie Braun for filing.

## 2019-07-10 NOTE — Patient Outreach (Signed)
ED Peer Support Specialist Patient Intake (Complete at intake & 30-60 Day Follow-up)  Name: [name]  MRN: [id number]  Age: 34 y.o.   Date of Admission: 07/10/2019  Intake: Initial Comments:      Primary Reason Admitted: Opiate withdrawal ([place]  Lab values: Alcohol/ETOH: Not completed Positive UDS? Drug screen not completed Amphetamines: Drug screen not completed Barbiturates: Drug screen not completed Benzodiazepines: Drug screen not completed Cocaine: Drug screen not completed Opiates: Drug screen not completed Cannabinoids: Drug screen not completed  Demographic information: Gender: Male Ethnicity: Latino Marital Status: Single Insurance Status: Medicaid Ecologist (Work Neurosurgeon, Physicist, medical, etc.: Yes(Food Stamps an disability) Lives with: Friend/Rommate Living situation: House/Apartment  Reported Patient History: Patient reported health conditions: Schizoaffective disorder Patient aware of HIV and hepatitis status: No  In past year, has patient visited ED for any reason? Yes  Number of ED visits: 2  Reason(s) for visit: substance abuse  In past year, has patient been hospitalized for any reason? No  Number of hospitalizations:    Reason(s) for hospitalization:    In past year, has patient been arrested? No  Number of arrests:    Reason(s) for arrest:    In past year, has patient been incarcerated? No  Number of incarcerations:    Reason(s) for incarceration:    In past year, has patient received medication-assisted treatment? No  In past year, patient received the following treatments: Residential treatment (non-hospital), Non-residential community treatment  In past year, has patient received any harm reduction services? No  Did this include any of the following?    In past year, has patient received care from a mental health provider for diagnosis other than SUD? Yes  In past year, is this first time  patient has overdosed? No  Number of past overdoses:    In past year, is this first time patient has been hospitalized for an overdose? No  Number of hospitalizations for overdose(s): 2  Is patient currently receiving treatment for a mental health diagnosis? Yes  Patient reports experiencing difficulty participating in SUD treatment: No    Most important reason(s) for this difficulty?    Has patient received prior services for treatment? Yes  In past, patient has received services from following agencies: Other (comment)  Plan of Care:  Suggested follow up at these agencies/treatment centers: Other (comment)(Pt stated that he wants resource only)  Other information: CPSS met with Pt an was able to process with Pt to gain information to better assist Pt. CPSS was made aware that he does not have a substance abuse issue, Pt stated he just tried something new for the first time. Pt stated that he is thankful for all the support offered but his mother wants him to get help not him. CPSS asked Pt if he would like to seek help at this time. Pt mentioned that he would be interested in a resource list an CPSS contact information to follow up with Pt in community.     Aaron Edelman Alaine Loughney, CPSS  07/10/2019 10:50 AM

## 2019-07-10 NOTE — BH Assessment (Signed)
Tele Assessment Note   Patient Name: [name] MRN: [id number] Referring Physician: Terrilee Files, MD Location of Patient: [place] Location of Provider: Behavioral Health TTS Department  Tyrian Kalmbach is an 34 y.o. male who presents to the ED under IVC initiated by his ACTT team. Per IVC, respondent "is a danger to harm himself and/or others" and reportedly told a nurse that he wanted to OD on heroin in a suicide attempt. IVC states the pt OD on heroin several weeks ago and he told his nurse that he wanted to die. Pt reports to TTS that he was "just kidding around" when he told the nurse that he wanted to kill himself. TTS asked the pt why he made this type of joke with his nurse and he states "because life is a joke." Pt is vague during the assessment and denies HI and AVH although chart review shows the pt has a hx of Schizoaffective d/o and has a hx of psychosis. Pt admits to heroin and cannabis use and states he last used 1 week ago. Pt has multiple ED visits and inpt hospitalizations c/o Schizoaffective d/o.   TTS contacted the petitioner of the IVC, [phone], Janae Sauce. She reports she witnessed the pt using heroin today and he told her "I want to die." RN is unaware how many times he has attempted suicide in the past but she reports he has been hospitalized multiple times c/o similar concerns.   Nira Conn, NP recommends continued observation for safety and stabilization and to be reassessed in the AM by psych. EDP Terrilee Files, MD and Rex Kras, RN have been advised.   Diagnosis: Schizoaffective d/o; Opioid use d/o, severe; Cannabis use d/o, severe  Past Medical History:  Past Medical History:  Diagnosis Date  . Depression   . Hypertension   . Schizoaffective disorder (HCC)   . Schizophrenia [facility])     Past Surgical History:  Procedure Laterality Date  . BACK SURGERY      Family History:  Family History  Problem Relation Age of Onset  .  Allergies Mother   . Asthma Mother     Social History:  reports that he has been smoking cigarettes and pipe. He has a 8.00 pack-year smoking history. He uses smokeless tobacco. He reports previous alcohol use. He reports current drug use. Drug: Marijuana.  Additional Social History:  Alcohol / Drug Use Pain Medications: See MAR Prescriptions: See MAR Over the Counter: See MAR History of alcohol / drug use?: Yes Longest period of sobriety (when/how long): 1 week Negative Consequences of Use: Personal relationships Withdrawal Symptoms: Patient aware of relationship between substance abuse and physical/medical complications Substance #1 Name of Substance 1: Heroin 1 - Age of First Use: 20s 1 - Amount (size/oz): varies 1 - Frequency: daily 1 - Duration: ongoing 1 - Last Use / Amount: 1 week ago Substance #2 Name of Substance 2: Cannabis 2 - Age of First Use: 69s 2 - Amount (size/oz): varies 2 - Frequency: daily 2 - Duration: ongoing 2 - Last Use / Amount: 1 week ago  CIWA: CIWA-Ar BP: 138/84 Pulse Rate: 73 COWS: Clinical Opiate Withdrawal Scale (COWS) Resting Pulse Rate: Pulse Rate 80 or below Sweating: No report of chills or flushing Restlessness: Able to sit still Pupil Size: Pupils pinned or normal size for room light Bone or Joint Aches: Not present Runny Nose or Tearing: Not present GI Upset: nausea or loose stool Tremor: No tremor Yawning: No yawning Anxiety or Irritability: None  Gooseflesh Skin: Skin is smooth COWS Total Score: 2  Allergies: No Known Allergies  Home Medications: (Not in a hospital admission)   OB/GYN Status:  No LMP for male patient.  General Assessment Data [facility] of Assessment: [facility] TTS Assessment: In system Is this a Tele or Face-to-Face Assessment?: Tele Assessment Is this an Initial Assessment or a Re-assessment for this encounter?: Initial Assessment Patient Accompanied by:: N/A Language Other than English: No Living  Arrangements: Other (Comment)(boarding house) What gender do you identify as?: Male Marital status: Single Pregnancy Status: No Living Arrangements: Non-relatives/Friends Can pt return to current living arrangement?: Yes Admission Status: Involuntary Petitioner: Other(ACTT) Is patient capable of signing voluntary admission?: No Referral Source: Psychiatrist Insurance type: [facility]     Crisis Care Plan Living Arrangements: Non-relatives/Friends Name of Psychiatrist: [facility] Name of Therapist: [facility]  Education Status Is patient currently in school?: No Is the patient employed, unemployed or receiving disability?: Receiving disability income  Risk to self with the past 6 months Suicidal Ideation: No-Not Currently/Within Last 6 Months(pt denies, IVC states pt made threats) Has patient been a risk to self within the past 6 months prior to admission? : Yes Suicidal Intent: No-Not Currently/Within Last 6 Months Has patient had any suicidal intent within the past 6 months prior to admission? : Yes Is patient at risk for suicide?: Yes Suicidal Plan?: No-Not Currently/Within Last 6 Months Has patient had any suicidal plan within the past 6 months prior to admission? : Yes Access to Means: Yes Specify Access to Suicidal Means: per IVC respondent made threats to OD on heroin  What has been your use of drugs/alcohol within the last 12 months?: heroin, cannabis Previous Attempts/Gestures: No Triggers for Past Attempts: None known Intentional Self Injurious Behavior: None Family Suicide History: No Recent stressful life event(s): Other (Comment)(substance abuse) Persecutory voices/beliefs?: No Depression: No Substance abuse history and/or treatment for substance abuse?: Yes Suicide prevention information given to non-admitted patients: Not applicable  Risk to Others within the past 6 months Homicidal Ideation: No Does patient have any lifetime risk of  violence toward others beyond the six months prior to admission? : No Thoughts of Harm to Others: No Current Homicidal Intent: No Current Homicidal Plan: No Access to Homicidal Means: No History of harm to others?: No Assessment of Violence: None Noted Does patient have access to weapons?: No Criminal Charges Pending?: (unk) Is patient on probation?: Unknown  Psychosis Hallucinations: None noted(denies at present, per chart pt has hx) Delusions: None noted  Mental Status Report Appearance/Hygiene: Disheveled Eye Contact: Fair Motor Activity: Freedom of movement Speech: Pressured Level of Consciousness: Alert Mood: Labile, Preoccupied Affect: Labile Anxiety Level: None Thought Processes: Circumstantial Judgement: Impaired Orientation: Person, Place, Time Obsessive Compulsive Thoughts/Behaviors: None  Cognitive Functioning Concentration: Normal Memory: Remote Intact, Recent Intact Is patient IDD: No Insight: Poor Impulse Control: Poor Appetite: Good Have you had any weight changes? : No Change Sleep: Decreased Total Hours of Sleep: 2 Vegetative Symptoms: None  ADLScreening [facility]) Patient's cognitive ability adequate to safely complete daily activities?: Yes Patient able to express need for assistance with ADLs?: Yes Independently performs ADLs?: Yes (appropriate for developmental age)  Prior Inpatient Therapy Prior Inpatient Therapy: Yes Prior Therapy Dates: 2020 and mult other admissions  Prior Therapy Facilty/Provider(s): [facility] Reason for Treatment: SCHIZOAFFECTIVE  Prior Outpatient Therapy Prior Outpatient Therapy: Yes Prior Therapy Dates: ONGOING Prior Therapy Facilty/Provider(s): [facility] Reason for Treatment: SCHIZOAFFECTIVE Does patient have an ACCT team?: Yes Does patient  have Intensive In-House Services?  : No Does patient have [facility] services? : No Does patient have P4CC services?: No  ADL Screening (condition at  time of admission) Patient's cognitive ability adequate to safely complete daily activities?: Yes Is the patient deaf or have difficulty hearing?: No Does the patient have difficulty seeing, even when wearing glasses/contacts?: No Does the patient have difficulty concentrating, remembering, or making decisions?: No Patient able to express need for assistance with ADLs?: Yes Does the patient have difficulty dressing or bathing?: No Independently performs ADLs?: Yes (appropriate for developmental age) Does the patient have difficulty walking or climbing stairs?: No Weakness of Legs: None Weakness of Arms/Hands: None  Home Assistive Devices/Equipment Home Assistive Devices/Equipment: None    Abuse/Neglect Assessment (Assessment to be complete while patient is alone) Abuse/Neglect Assessment Can Be Completed: Yes Physical Abuse: Denies Verbal Abuse: Denies Sexual Abuse: Denies Exploitation of patient/patient's resources: Denies Self-Neglect: Denies     Merchant navy officer (For Healthcare) Does Patient Have a Medical Advance Directive?: No Would patient like information on creating a medical advance directive?: No - Patient declined          Disposition: Nira Conn, NP recommends continued observation for safety and stabilization and to be reassessed in the AM by psych. EDP Terrilee Files, MD and Rex Kras, RN have been advised.  Disposition Initial Assessment Completed for this Encounter: Yes Disposition of Patient: (overnight OBS pending AM psych assessment) Patient refused recommended treatment: No  This service was provided via telemedicine using a 2-way, interactive audio and video technology.  Names of all persons participating in this telemedicine service and their role in this encounter. Name: [facility] Role: Patient  Name: Princess Bruins Role: TTS          [name] 07/10/2019 2:56 AM

## 2019-07-10 NOTE — ED Provider Notes (Signed)
Emergency Medicine Observation Re-evaluation Note  [name] is a 34 y.o. male, seen on rounds today.  Pt initially presented to the ED for complaints of Medical Clearance (IVC) Currently, the patient is resting, alert and cooperative.  Patient states he is not sure why he was brought here last night.  He presented then, under commitment, signed by a nurse who came to his home  to give him a Prolixin injection.  Patient states he last used drugs about 1 week ago.  He denies suicidal ideation or intent.  He denies any concurrent illnesses.  Physical Exam  BP 114/73 (BP Location: Right Arm)   Pulse 74   Temp 98.5 F (36.9 C) (Oral)   Resp 16   SpO2 95%  Physical Exam  Lucid, calm and cooperative.  No dysarthria or aphasia.  No respiratory distress.  ED Course / MDM  EKG:EKG Interpretation  Date/Time:  Friday July 09 2019 18:14:46 EST Ventricular Rate:  73 PR Interval:  168 QRS Duration: 100 QT Interval:  358 QTC Calculation: 394 R Axis:   72 Text Interpretation: Sinus rhythm with occasional Premature ventricular complexes Otherwise normal ECG No significant change since prior 2/21 Confirmed by Meridee Score [phone]) on 07/09/2019 6:17:27 PM  Clinical Course as of Jul 09 1008  Fri Jul 09, 2019  1903 Differential includes opiate withdrawal, opiate intoxication, schizophrenia, suicidal ideation.  Him on the COWS protocol.  Lab work notable for an elevated white count of 12.9 and elevated glucose of 134.   [MB]    Clinical Course User Index [MB] Terrilee Files, MD   I have reviewed the labs performed to date as well as medications administered while in observation.  Recent changes in the last 24 hours include patient denies using illegal substances.  He does not have any suicidal ideation. Plan  Current plan is for TTS reassessment. Patient is under full IVC at this time.   Mancel Bale, MD 07/10/19 1012

## 2019-07-10 NOTE — ED Notes (Signed)
Admitted to Acute Unit. Sitter no longer need as unit is specific for pt safety without a sitter. Pt is calm and cooperative.

## 2019-07-10 NOTE — BHH Suicide Risk Assessment (Cosign Needed)
Suicide Risk Assessment  Discharge Assessment   [facility] Discharge Suicide Risk Assessment   Principal Problem: Substance induced mood disorder (HCC) Discharge Diagnoses: Principal Problem:   Substance induced mood disorder (HCC) Active Problems:   Schizophrenia, paranoid, chronic (HCC)   Total Time spent with patient: 30 minutes  Musculoskeletal: Strength & Muscle Tone: within normal limits Gait & Station: normal Patient leans: N/A  Psychiatric Specialty Exam:   Blood pressure 135/78, pulse 79, temperature 99.1 F (37.3 C), temperature source Oral, resp. rate 19, SpO2 98 %.There is no height or weight on file to calculate BMI.  General Appearance: Casual and Fairly Groomed  Eye Contact::  Good  Speech:  Clear and Coherent409  Volume:  Normal  Mood:  Anxious and Euthymic  Affect:  Congruent and Flat  Thought Process:  Coherent and Descriptions of Associations: Intact  Orientation:  Full (Time, Place, and Person)  Thought Content:  Rumination and patient ruminating on "I don't know why I am here"  Suicidal Thoughts:  No  Homicidal Thoughts:  No  Memory:  Immediate;   Good Recent;   Good Remote;   Good  Judgement:  Other:  as evidenced by hx for polysubstance use  Insight:  Fair  Psychomotor Activity:  Increased  Concentration:  Fair  Recall:  Good  Fund of Knowledge:Good  Language: Good  Akathisia:  Negative  Handed:  Right  AIMS (if indicated):     Assets:  Communication Skills Housing Social Support  Sleep:     Cognition: WNL  ADL's:  Intact   Mental Status Per Nursing Assessment::   On Admission:     Demographic Factors:  Male and Low socioeconomic status  Loss Factors: NA  Historical Factors: Impulsivity and related to drug usage  Risk Reduction Factors:   Positive social support and Positive therapeutic relationship  Continued Clinical Symptoms:  Alcohol/Substance Abuse/Dependencies Schizophrenia:   Paranoid or undifferentiated type More than one  psychiatric diagnosis  Cognitive Features That Contribute To Risk:  Closed-mindedness    Suicide Risk:  Mild:  Suicidal ideation of limited frequency, intensity, duration, and specificity.  There are no identifiable plans, no associated intent, mild dysphoria and related symptoms, good self-control (both objective and subjective assessment), few other risk factors, and identifiable protective factors, including available and accessible social support.   Plan Of Care/Follow-up recommendations:  Patient with hx for Paranoid Schizophrenia, polysubstance abuse is admitted IVC for evaluation of suicidal ideations.  Per TTS assessment note, patient denies suicide intent and states he was "just joking" when he made the comment previously.  He endorses polysubstance use,states "it calms me down" but denies a plan to intentional overdose.  He denies homicidal ideations, audible or visual hallucinations.  He does not demonstrate paranoid or delusional behaviors.  He is clear and coherent today, irritated "because I don't understand why I am here" during the assessment but cooperative and answers questions.  [facility], compliant with outpatient MH appointments.  He takes prolixin injectable, last dose received 2/19.    Recommend peer support  TTS to contact ACT team to notify them of patient discharge to schedule follow-up appointment and arrange transportation if available.   Chales Abrahams, NP 07/10/2019, 5:25 PM

## 2019-07-10 NOTE — ED Notes (Signed)
2 Pt belongings bags moved from locker 28 to locker 34. Bags are labeled with Pt labels.

## 2019-07-10 NOTE — BH Assessment (Addendum)
[facility] Assessment Progress Note This Clinical research associate contacted [facility] [phone] in reference to transporting patient back to his residence. Patient is being discharged this date. ACT crisis reported they are currently not providing transportation due to COVID concerns. This Clinical research associate will inform patient and nurse that other means of transportation need to be investigated.

## 2019-07-10 NOTE — Progress Notes (Signed)
Nira Conn, NP recommends continued observation for safety and stabilization and to be reassessed in the AM by psych. EDP Terrilee Files, MD and Rex Kras, RN have been advised. Nira Conn, NP recommends continued observation for safety and stabilization and to be reassessed in the AM by psych. EDP Terrilee Files, MD and Rex Kras, RN have been advised.

## 2019-11-01 ENCOUNTER — Encounter (HOSPITAL_COMMUNITY): Payer: Self-pay

## 2019-11-01 ENCOUNTER — Other Ambulatory Visit: Payer: Self-pay

## 2019-11-01 ENCOUNTER — Emergency Department (HOSPITAL_COMMUNITY)
Admission: EM | Admit: 2019-11-01 | Discharge: 2019-11-01 | Disposition: A | Discharge status: Home / Self Care | Payer: Medicare Other | Attending: Emergency Medicine | Admitting: Emergency Medicine

## 2019-11-01 ENCOUNTER — Emergency Department (HOSPITAL_COMMUNITY): Payer: Medicare Other

## 2019-11-01 DIAGNOSIS — I10 Essential (primary) hypertension: Secondary | ICD-10-CM | POA: Insufficient documentation

## 2019-11-01 DIAGNOSIS — Z79899 Other long term (current) drug therapy: Secondary | ICD-10-CM | POA: Diagnosis not present

## 2019-11-01 DIAGNOSIS — W500XXA Accidental hit or strike by another person, initial encounter: Secondary | ICD-10-CM | POA: Diagnosis not present

## 2019-11-01 DIAGNOSIS — F17210 Nicotine dependence, cigarettes, uncomplicated: Secondary | ICD-10-CM | POA: Diagnosis not present

## 2019-11-01 DIAGNOSIS — S8990XA Unspecified injury of unspecified lower leg, initial encounter: Secondary | ICD-10-CM | POA: Diagnosis not present

## 2019-11-01 DIAGNOSIS — Y9289 Other specified places as the place of occurrence of the external cause: Secondary | ICD-10-CM | POA: Insufficient documentation

## 2019-11-01 DIAGNOSIS — S83422A Sprain of lateral collateral ligament of left knee, initial encounter: Secondary | ICD-10-CM | POA: Diagnosis not present

## 2019-11-01 DIAGNOSIS — Y998 Other external cause status: Secondary | ICD-10-CM | POA: Diagnosis not present

## 2019-11-01 DIAGNOSIS — Y9389 Activity, other specified: Secondary | ICD-10-CM | POA: Insufficient documentation

## 2019-11-01 DIAGNOSIS — M25569 Pain in unspecified knee: Secondary | ICD-10-CM | POA: Diagnosis present

## 2019-11-01 MED ORDER — NAPROXEN 500 MG PO TABS: 500.0000 mg | ORAL_TABLET | Freq: Once | ORAL | Status: DC

## 2019-11-01 MED ORDER — ACETAMINOPHEN 325 MG PO TABS
650.0000 mg | ORAL_TABLET | Freq: Once | ORAL | Status: AC
  Administered 2019-11-01: 650 mg via ORAL
  Filled 2019-11-01: qty 2

## 2019-11-01 NOTE — ED Notes (Signed)
Pt unwilling on to wait on D/C papers Pt ambulatory leaving fast track

## 2019-11-01 NOTE — Discharge Instructions (Addendum)
Thank you for allowing me to care for you today. Please return to the emergency department if you have new or worsening symptoms. Take your medications as instructed.  ° °
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
## 2019-11-17 ENCOUNTER — Ambulatory Visit (HOSPITAL_COMMUNITY)
Admission: EM | Admit: 2019-11-17 | Discharge: 2019-11-17 | Disposition: A | Discharge status: Home / Self Care | Payer: Medicare Other | Attending: Psychiatry | Admitting: Psychiatry

## 2019-11-17 ENCOUNTER — Other Ambulatory Visit: Payer: Self-pay

## 2019-11-17 DIAGNOSIS — F259 Schizoaffective disorder, unspecified: Secondary | ICD-10-CM | POA: Diagnosis not present

## 2019-11-17 DIAGNOSIS — F411 Generalized anxiety disorder: Secondary | ICD-10-CM

## 2019-11-17 DIAGNOSIS — Z1331 Encounter for screening for depression: Secondary | ICD-10-CM | POA: Diagnosis present

## 2019-11-17 NOTE — ED Notes (Signed)
Patient belongings in locker 27 

## 2019-11-17 NOTE — BH Assessment (Addendum)
Comprehensive Clinical Assessment (CCA) Screening, Triage and Referral Note  11/17/2019 [name] [id number]   Patient is a 34 y.o. male with a history of Schizoaffective Disorder who presents with his mother for assessment due to worsening depression and anxiety.  Patient states he has "some things" going on in the transitional living home where he lives and describes it as "you know everyone has their issues."  He states he is experiencing worsening depression with symptoms of isolating, anergia and low motivation.  Patient denies SI and HI.  He reports auditory hallucinations, however states they are not command type and are not bothersome at this time.  He states he takes prolixin as prescribed, Q 3 week injection and uses CBT skills and meditation at night help with voices.  Patient is hopeful for an additional medication added to his regimen to treat depression.  He is not interested in inpatient treatment at this time.    Patient informed NP Money that he actually saw his [facility] provider this morning, however the provider did not give him the medication requested.  Patient does not specify medicine requested.  The recommendation is that patient follow up with [facility] provider to discuss medication options for depression/anxiety.   Per Reola Calkins, NP patient does not meet inpatient criteria.  Recommendation is that patient follow up with [facility] to discuss medication options.   Visit Diagnosis: No diagnosis found.     ED from 11/17/2019 in [facility]  PHQ-9 Total Score 18       Patient Reported Information How did you hear about [place]? Self   Referral name: No data recorded  Referral phone number: No data recorded Whom do you see for routine medical problems? I don't have a doctor (Patient followed by [facility])   Practice/Facility Name: No data recorded  Practice/Facility Phone Number: No data recorded  Name of  Contact: No data recorded  Contact Number: No data recorded  Contact Fax Number: No data recorded  Prescriber Name: No data recorded  Prescriber Address (if known): No data recorded What Is the Reason for Your Visit/Call Today? No data recorded How Long Has This Been Causing You Problems? 1 wk - 1 month  Have You Recently Been in Any Inpatient Treatment (Hospital/Detox/Crisis Center/28-Day Program)? No   Name/Location of Program/Hospital:No data recorded  How Long Were You There? No data recorded  When Were You Discharged? No data recorded Have You Ever Received Services From [facility] Before? Yes   Who Do You See at [facility]? Past inpatient admissions to [facility]  Have You Recently Had Any Thoughts About Hurting Yourself? No   Are You Planning to Commit Suicide/Harm Yourself At This time?  No  Have you Recently Had Thoughts About Hurting Someone [name]? No   Explanation: No data recorded Have You Used Any Alcohol or Drugs in the Past 24 Hours? No   How Long Ago Did You Use Drugs or Alcohol?  No data recorded  What Did You Use and How Much? No data recorded What Do You Feel Would Help You the Most Today? Medication  Do You Currently Have a Therapist/Psychiatrist? Yes   Name of Therapist/Psychiatrist: [facility]   Have You Been Recently Discharged From Any Office Practice or Programs? No   Explanation of Discharge From Practice/Program:  No data recorded    CCA Screening Triage Referral Assessment Type of Contact: Face-to-Face   Is this Initial or Reassessment? No data recorded  Date Telepsych consult ordered in [facility]:  No data recorded  Time Telepsych consult ordered in [facility]:  No data recorded Patient Reported Information Reviewed? Yes   Patient Left Without Being Seen? No data recorded  Reason for Not Completing Assessment: No data recorded Collateral Involvement: Patient's mother presented with patient.  She expressed concerns and is hopeful for  inpatient admission for med adjustment.  Does Patient Have a Automotive engineer Guardian? No data recorded  Name and Contact of Legal Guardian:  self  If Minor and Not Living with Parent(s), Who has Custody? No data recorded Is CPS involved or ever been involved? Never  Is APS involved or ever been involved? Never  Patient Determined To Be At Risk for Harm To Self or Others Based on Review of Patient Reported Information or Presenting Complaint? No   Method: No data recorded  Availability of Means: No data recorded  Intent: No data recorded  Notification Required: No data recorded  Additional Information for Danger to Others Potential:  No data recorded  Additional Comments for Danger to Others Potential:  No data recorded  Are There Guns or Other Weapons in Your Home?  No data recorded   Types of Guns/Weapons: No data recorded   Are These Weapons Safely Secured?                              No data recorded   Who Could Verify You Are Able To Have These Secured:    No data recorded Do You Have any Outstanding Charges, Pending Court Dates, Parole/Probation? No data recorded Contacted To Inform of Risk of Harm To Self or Others: No data recorded Location of Assessment: [facility]  Does Patient Present under Involuntary Commitment? No   IVC Papers Initial File Date: No data recorded  [place] of Residence: [place]  Patient Currently Receiving the Following Services: ACTT Psychologist, educational)   Determination of Need: Routine (7 days)   Options For Referral: Medication Management   Yetta Glassman

## 2019-11-17 NOTE — Discharge Instructions (Addendum)
Keep follow up appointments Continue current home medications Contact [facility] for medication changes Possibilities to assist with anxiety include Buspar or Hydroxyzine

## 2019-11-17 NOTE — ED Provider Notes (Signed)
Behavioral Health Medical Screening Exam  [name] is a 34 y.o. male who presented to the [facility] with complaints of worsening depression, and anxiety. He reported isolating in his home, driving himself crazy, feeling " like a piece of shit." He reported having a lot of thoughts in his head because he is "schizo." He denies SI/HI. He denies AVH. He reported that he was seen this morning by the ACT team doctor and mentioned both things to the doctor. He reported that the doctor wanted him to take medications that the had taken in the past that did not work for him. He was unable to recall the name of the medication that he had previously taken for anxiety or depression. He reported that the ACT team doesn't know what to prescribe for his anxiety and depression and requested for this provider to make medication recommendations to the ACT team. He reported receiving  a long-acting injectable that causes him to feel tired and groggy. He reported that he was prescribed Xanax in the past for anxiety, but it was discontinued in 2008 due to concerns about drug dependency.    The patient's ACT team was notified that the patient presented as a walk-in today at the [facility] seeking medications for depression and anxiety. Per the ACT team receptionist, the patient is an active patient and an ACT team member will be notified regarding the patient's current status. I discussed with the patient that he will need to follow up with the ACT team regarding medication changes to treat his depression and anxiety.  The patient agreed to follow up with the ACT team and feels safe to return home.   Total Time spent with patient: 30 minutes  Psychiatric Specialty Exam  Presentation  General Appearance:Casual  Eye Contact:Good  Speech:Clear and Coherent;Normal Rate  Speech Volume:Normal  Handedness:Right   Mood and Affect  Mood:Anxious  Affect:Appropriate   Thought Process  Thought  Processes:Coherent  Descriptions of Associations:Intact  Orientation:Full (Time, Place and Person)  Thought Content:WDL  Hallucinations:None  Ideas of Reference:None  Suicidal Thoughts:No  Homicidal Thoughts:No   Sensorium  Memory:Immediate Good;Recent Good;Remote Good  Judgment:Fair  Insight:Fair   Executive Functions  Concentration:Fair  Attention Span:Fair  Recall:Fair  Fund of Knowledge:Fair  Language:Good   Psychomotor Activity  Psychomotor Activity:Normal   Assets  Assets:Communication Skills;Desire for Improvement;Financial Resources/Insurance;Housing;Social Support;Transportation   Sleep  Sleep:Good  Number of hours: No data recorded  Physical Exam: Physical Exam Vitals and nursing note reviewed.  Constitutional:      Appearance: He is well-developed.  Cardiovascular:     Rate and Rhythm: Normal rate.  Pulmonary:     Effort: Pulmonary effort is normal.  Musculoskeletal:        General: Normal range of motion.  Skin:    General: Skin is warm.  Neurological:     Mental Status: He is alert and oriented to person, place, and time.    Review of Systems  Constitutional: Negative.   HENT: Negative.   Eyes: Negative.   Respiratory: Negative.   Cardiovascular: Negative.   Gastrointestinal: Negative.   Genitourinary: Negative.   Musculoskeletal: Negative.   Skin: Negative.   Neurological: Negative.   Endo/Heme/Allergies: Negative.   Psychiatric/Behavioral: Positive for depression. The patient is nervous/anxious.    Blood pressure 128/88, pulse 98, temperature 98.7 F (37.1 C), temperature source Oral, resp. rate 18, height 5\' 11"  (1.803 m), weight 230 lb (104.3 kg), SpO2 98 %. Body mass index is 32.08 kg/m.  Musculoskeletal: Strength & Muscle  Tone: within normal limits Gait & Station: normal Patient leans: N/A   Recommendations:  Based on my evaluation the patient does not appear to have an emergency medical condition.   Patient advised to follow up with the ACT team for medication management.   Gerlene Burdock Aprile Dickenson, FNP 11/17/2019, 3:41 PM

## 2020-04-09 ENCOUNTER — Emergency Department (HOSPITAL_COMMUNITY)
Admission: EM | Admit: 2020-04-09 | Discharge: 2020-04-09 | Discharge status: Against Medical Advice | Payer: Medicare Other | Attending: Emergency Medicine | Admitting: Emergency Medicine

## 2020-04-09 ENCOUNTER — Other Ambulatory Visit: Payer: Self-pay

## 2020-04-09 ENCOUNTER — Emergency Department (HOSPITAL_COMMUNITY): Payer: Medicare Other

## 2020-04-09 ENCOUNTER — Encounter (HOSPITAL_COMMUNITY): Payer: Self-pay

## 2020-04-09 DIAGNOSIS — F17210 Nicotine dependence, cigarettes, uncomplicated: Secondary | ICD-10-CM | POA: Insufficient documentation

## 2020-04-09 DIAGNOSIS — Z20822 Contact with and (suspected) exposure to covid-19: Secondary | ICD-10-CM | POA: Diagnosis not present

## 2020-04-09 DIAGNOSIS — F1410 Cocaine abuse, uncomplicated: Secondary | ICD-10-CM | POA: Diagnosis not present

## 2020-04-09 DIAGNOSIS — R059 Cough, unspecified: Secondary | ICD-10-CM | POA: Diagnosis not present

## 2020-04-09 DIAGNOSIS — F259 Schizoaffective disorder, unspecified: Secondary | ICD-10-CM | POA: Insufficient documentation

## 2020-04-09 DIAGNOSIS — I10 Essential (primary) hypertension: Secondary | ICD-10-CM | POA: Insufficient documentation

## 2020-04-09 DIAGNOSIS — Z79899 Other long term (current) drug therapy: Secondary | ICD-10-CM | POA: Insufficient documentation

## 2020-04-09 DIAGNOSIS — T401X1A Poisoning by heroin, accidental (unintentional), initial encounter: Secondary | ICD-10-CM

## 2020-04-09 DIAGNOSIS — J9601 Acute respiratory failure with hypoxia: Secondary | ICD-10-CM | POA: Diagnosis not present

## 2020-04-09 LAB — COMPREHENSIVE METABOLIC PANEL
ALT: 38 U/L (ref 0–44)
AST: 31 U/L (ref 15–41)
Albumin: 4.3 g/dL (ref 3.5–5.0)
Alkaline Phosphatase: 71 U/L (ref 38–126)
Anion gap: 7 (ref 5–15)
BUN: 21 mg/dL — ABNORMAL HIGH (ref 6–20)
CO2: 32 mmol/L (ref 22–32)
Calcium: 9.3 mg/dL (ref 8.9–10.3)
Chloride: 96 mmol/L — ABNORMAL LOW (ref 98–111)
Creatinine, Ser: 1.06 mg/dL (ref 0.61–1.24)
GFR, Estimated: >60 mL/min (ref >60)
Glucose, Bld: 305 mg/dL — ABNORMAL HIGH (ref 70–99)
Potassium: 3.7 mmol/L (ref 3.5–5.1)
Sodium: 135 mmol/L (ref 135–145)
Total Bilirubin: 0.5 mg/dL (ref 0.3–1.2)
Total Protein: 7.6 g/dL (ref 6.5–8.1)

## 2020-04-09 LAB — CBC WITH DIFFERENTIAL/PLATELET
Abs Immature Granulocytes: 0.1 10*3/uL — ABNORMAL HIGH (ref 0.00–0.07)
Basophils Absolute: 0.1 10*3/uL (ref 0.0–0.1)
Basophils Relative: 0 %
Eosinophils Absolute: 0.1 10*3/uL (ref 0.0–0.5)
Eosinophils Relative: 1 %
HCT: 43.4 % (ref 39.0–52.0)
Hemoglobin: 14.9 g/dL (ref 13.0–17.0)
Immature Granulocytes: 1 %
Lymphocytes Relative: 11 %
Lymphs Abs: 1.7 10*3/uL (ref 0.7–4.0)
MCH: 29.7 pg (ref 26.0–34.0)
MCHC: 34.3 g/dL (ref 30.0–36.0)
MCV: 86.6 fL (ref 80.0–100.0)
Monocytes Absolute: 0.9 10*3/uL (ref 0.1–1.0)
Monocytes Relative: 6 %
Neutro Abs: 12.7 10*3/uL — ABNORMAL HIGH (ref 1.7–7.7)
Neutrophils Relative %: 81 %
Platelets: 290 10*3/uL (ref 150–400)
RBC: 5.01 MIL/uL (ref 4.22–5.81)
RDW: 11.9 % (ref 11.5–15.5)
WBC: 15.5 10*3/uL — ABNORMAL HIGH (ref 4.0–10.5)
nRBC: 0 % (ref 0.0–0.2)

## 2020-04-09 LAB — RESP PANEL BY RT-PCR (FLU A&B, COVID) ARPGX2
Influenza A by PCR: NEGATIVE
Influenza B by PCR: NEGATIVE
SARS Coronavirus 2 by RT PCR: NEGATIVE

## 2020-04-09 LAB — RAPID URINE DRUG SCREEN, HOSP PERFORMED
Amphetamines: NOT DETECTED
Barbiturates: NOT DETECTED
Benzodiazepines: NOT DETECTED
Cocaine: POSITIVE — AB
Opiates: NOT DETECTED
Tetrahydrocannabinol: NOT DETECTED

## 2020-04-09 LAB — ACETAMINOPHEN LEVEL: Acetaminophen (Tylenol), Serum: <10 ug/mL — ABNORMAL LOW (ref 10–30)

## 2020-04-09 LAB — SALICYLATE LEVEL: Salicylate Lvl: <7 mg/dL — ABNORMAL LOW (ref 7.0–30.0)

## 2020-04-09 LAB — ETHANOL: Alcohol, Ethyl (B): <10 mg/dL

## 2020-04-09 MED ORDER — NALOXONE HCL 0.4 MG/ML IJ SOLN
0.4000 mg | Freq: Once | INTRAMUSCULAR | Status: AC
  Administered 2020-04-09: 0.4 mg via INTRAVENOUS
  Filled 2020-04-09: qty 1

## 2020-04-09 MED ORDER — NALOXONE HCL 0.4 MG/ML IJ SOLN: 0.4000 mg | INTRAMUSCULAR | Status: DC | PRN

## 2020-04-09 NOTE — Discharge Instructions (Signed)
Substance Abuse Treatment Programs ° °Intensive Outpatient Programs °[facility]     °[address]      °[place], [place]                   °[phone]      ° °[facility] °[address] °[place], [place] °336-379-7146 ° °[facility]     °(Inpatient and outpatient)     °[address]           °[phone]   ° °[facility] °336-288-1484 (Suboxone and Methadone) ° °[address]      °[place], [place] [address]      °336-882-2125      ° °[address] °[place], [place] °852-3033 ° °[facility] (Outpatient/Inpatient, Chemical)    °(insurance only) [phone]      °       °[facility] (Groups & Residential) °[place], [place] °336-389-1413 ° °   °[facility]     °[address]     °[place], [place]      °[phone]      ° °[facility] (for caregivers and family) °[address] °[place], [place] °336-299-4655 ° ° ° ° ° °Residential Treatment Programs °[facility]      °[address], [place], [place] [address]  °([phone]      ° °[facility] °[address] [place], [place] [address] °[phone] ° °[facility]        °336-248-8914      ° °[facility] °1-800-659-3381 ° °[facility]             °[address]                                         °[place], [place]                                                °877-615-2722 or 336-784-9470                              ° °[facility] [place] °[address] °[place], [address] °1.877.941.8954 ° °[facility]    °[address]      °[place], [place]     °[phone]      ° °[facility] °[address] °[place], [place] °336-285-9073 ° °[facility]   °[address]     °[place], [place] [address]     °336-899-1550      °Admissions: 8am-3pm M-F ° °[facility] °[address] °[place],  [place] °336-227-7417 ° °[facility]: Residential Program (90 Days)   °[place], [place]      °336-725-8389 or 800-758-6077    ° °[facility] °[place], [place] °(Walk in Hours over the weekend or by referral) ° °[facility] °[address], [place],  [address] °(336) [phone] ° °Crisis Mobile: [facility]:  1-877-626-1772 (for crisis response 24 hours a day) °[facility] Hotline:      1-800-256-2452 °Outpatient Psychiatry and Counseling ° °[facility]: Mobile Crisis   Management 24 hours:  1-877-626-1772 ° °[facility] sliding scale fee and walk in schedule: M-F 8am-12pm/1pm-3pm °[address]  °[place], [place] [address] °[phone] ° °[facility] °[address] °[place], [place] [address] °336-703-9650 ° °[facility] ([facility] known [facility])- new patient walk-in appointments available Monday - Friday 8am -3pm.          °[address] °[place], [place] [address] °336-676-6840 or crisis line- 336-676-6905 ° °[facility]/ Intensive Outpatient Therapy Program °[address] °[place], [place] [address] °336-832-9804 ° °[facility]                  °Crisis Services      °336.641.4993      °[address]     °[place], [place] [address]                ° °[facility]   °[facility] °[phone] °[address] °[place], [place] [address] ° ° °[facility]          °[address],  °[place], [place] [address]       °(336) [phone] ° °[facility] °[address] °[place], [place] [address] °336-292-1510 ° °[facility]    °[address]    °[place], [place] [address]     °336-632-3505      ° °Parish McKinney, MD     °[address]     °[place] [address]     °336-282-1251     °  °[facility] °[address] °[place] [address] ° °[facility]     °[address]     °[place], [place]      °336-542-2076      ° °[facility] °Shamsher Ahluwalia, MD °[address] °[place], [place] [address] °[phone] ° °[facility]     °[address]     °[place], [place] [address]     °336-274-1237      ° °[facility] °[address] °[place], [place] [address] °336-854-4450 °Resources for Temporary Residential Assistance/Crisis Centers ° °DAY CENTERS °[facility] °M-F 8am-3pm   °[address] [place], [place] [address]   336-332-0824 °Services include: laundry, barbering, support groups, case management, phone  & computer access, showers, AA/NA mtgs, mental health/substance abuse nurse, job skills class, disability information, VA assistance, spiritual classes, etc.  ° °HOMELESS SHELTERS ° °[facility]     °[facility]   °[address], [place]     °336.271.5959       °       °[facility] (women and children)       °[address] °[place], [place] [address] °336-275-0820 °[email or web address] for application and process °Application Required ° °[facility]   °[address]    °[place] [address]     °[phone]       °             °[facility] °[address] °, [place] [address] °336.273.5572 °336-235-0363(schedule application appt.) °Application Required ° °[facility] (women only)    °[address]     °[place], [place] [address]     °336-884-1039      °  Intake starts 6pm daily °Need valid ID, SSC, & Police report °[facility] [place] °[address] °[place], [place] °336-881-5420 °Application Required ° °[facility] (men only)     °[address]      °[place], [place]     °[phone]      ° °[facility] °(Pregnant women only) °[address] °[place], [place] °336-275-0206 ° °[facility]      °[address]      °[place], [place] [address]     °336-722-9951      °       °[facility] °[address] °[place], [place] °336-723-1848 °90 day commitment/SA/Application process ° °[facility](men only)     °[address]     °[place], [place]     °[phone]       °Check-in at 7pm     °       °[facility] °[address] °[place], [place] [address] °336-248-6684 °Men/Women/Women and Children must be there by 7 pm ° °[facility] °[place], [place] °336-722-8721                ° °

## 2020-04-09 NOTE — ED Notes (Signed)
Pt began wandering outside of the room. Told pt that he needed to be in his room. Pt stated "The Doctor told me that I could leave". Told pt that if that is the case we need to wait on his discharge papers. Pt said "Why? When the doctor said he already has my papers". Repeated to pt again that the RN needed to give him his discharge papers. At this moment, pt said "did you talk to my doctor? No? Okay, then I know more than you f*cking do". Told pt that the RN needed to provide discharge papers to actually leave. Pt walked into the room, [employer] notified.

## 2020-04-09 NOTE — ED Notes (Signed)
Pt's mother at bedside.

## 2020-04-09 NOTE — ED Triage Notes (Signed)
EMS reports Mother noticed Pt lethargic, called EMS, upon arrival Pt found unconscious, given Narcan with response, Pt admitted snorting $40 worth of Heroin. Pt awake and responsive on arrival.  BP 192/104 HR 122 RR 16 Sp02 85 RA CBG 305 (Not Dx diabetic)  20ga LAC 2mg  Narcan intranasal with response

## 2020-04-09 NOTE — ED Notes (Signed)
Pt refuses to keep O2 mask or cannula in place.

## 2020-04-09 NOTE — ED Notes (Signed)
Pt convinced by provider and complied to wear non re-breather mask,

## 2020-04-09 NOTE — ED Notes (Signed)
Patient is refusing to wear oxygen. Patient told primary RN Kathlene November to "suck my dick" and was becoming increasingly agitated when RN and other medical personnel were attempting to explain that he needed the Oxygen as his O2 saturation is low. Patient states he "does not believe the machine" MD at bedside

## 2020-04-09 NOTE — ED Provider Notes (Addendum)
[facility]-EMERGENCY DEPT Provider Note   CSN: [id number] Arrival date & time: 04/09/20  1338     History Chief Complaint  Patient presents with  . Drug Overdose    [name] is a 34 y.o. male.  HPI     34 year old male with history of schizophrenia, hypertension, polysubstance abuse comes in a chief complaint of overdose.  Patient brought in via ambulance.  According to the patient, he had just used some powder, and he thinks that all of it hit him at the same time.  He was with his mother when he started feeling unwell.  His mother called 911.  Patient's mother came to the bedside later into his stay.  She reported that when she picked him up, he appeared unsteady and not himself.  He was sleepy.  Soon after however she noticed that her eyes were rolling back in he was having trouble with the straw.  Patient is noted to be apneic and hypoxic.  He is somnolent and sleepy, but arousable to stimuli.  He does not want oxygen on.  He had received 2 of intranasal Narcan prior to ED arrival.  He reports that he normally does snore.  No history of lung disease.  He has a cough, but denies any nausea, vomiting, fevers, chills.  Past Medical History:  Diagnosis Date  . Depression   . Hypertension   . Schizoaffective disorder (HCC)   . Schizophrenia [facility])     Patient Active Problem List   Diagnosis Date Noted  . Substance induced mood disorder (HCC) 07/10/2019  . Schizoaffective schizophrenia (HCC) 11/05/2018  . Schizophrenia, paranoid, chronic (HCC) 06/08/2015  . Schizoaffective disorder, bipolar type (HCC) 10/21/2014  . Tobacco use disorder 10/21/2014  . Cocaine use disorder, moderate, dependence (HCC) 10/21/2014  . Cannabis use disorder, moderate, dependence (HCC) 10/21/2014    Past Surgical History:  Procedure Laterality Date  . BACK SURGERY         Family History  Problem Relation Age of Onset  . Allergies Mother   . Asthma Mother      Social History   Tobacco Use  . Smoking status: Current Every Day Smoker    Packs/day: 1.00    Years: 8.00    Pack years: 8.00    Types: Cigarettes, Pipe  . Smokeless tobacco: Current User  Vaping Use  . Vaping Use: Never used  Substance Use Topics  . Alcohol use: Not Currently  . Drug use: Yes    Types: Marijuana    Comment: heroin    Home Medications Prior to Admission medications   Medication Sig Start Date End Date Taking? Authorizing Provider  fluPHENAZine decanoate (PROLIXIN) 25 MG/ML injection Inject 2 mLs (50 mg total) into the muscle every 14 (fourteen) days. 10/02/18   Malvin Johns, MD  methocarbamol (ROBAXIN) 500 MG tablet Take 1 tablet (500 mg total) by mouth every 8 (eight) hours as needed for muscle spasms. 07/10/19   Chales Abrahams, NP  naproxen (NAPROSYN) 500 MG tablet Take 1 tablet (500 mg total) by mouth 2 (two) times daily as needed (aching, pain, or discomfort). 07/10/19   Chales Abrahams, NP  ondansetron (ZOFRAN-ODT) 4 MG disintegrating tablet Take 1 tablet (4 mg total) by mouth every 6 (six) hours as needed for nausea or vomiting. 07/10/19   Chales Abrahams, NP    Allergies    Patient has no known allergies.  Review of Systems   Review of Systems  Constitutional: Positive for  activity change.  Respiratory: Positive for cough. Negative for shortness of breath.   Cardiovascular: Negative for chest pain.  Gastrointestinal: Negative for nausea and vomiting.  Neurological: Negative for dizziness and headaches.  All other systems reviewed and are negative.   Physical Exam Updated Vital Signs BP 131/85   Pulse 83   Temp 98.2 F (36.8 C) (Oral)   Resp (!) 23   SpO2 92%   Physical Exam Vitals and nursing note reviewed.  Constitutional:      General: He is not in acute distress.    Appearance: He is well-developed. He is not diaphoretic.     Comments: Somnolent  HENT:     Head: Normocephalic and atraumatic.  Eyes:     Conjunctiva/sclera:  Conjunctivae normal.     Pupils: Pupils are equal, round, and reactive to light.     Comments: Pupils are 2 mm and equal  Cardiovascular:     Rate and Rhythm: Normal rate and regular rhythm.  Pulmonary:     Effort: Pulmonary effort is normal.     Breath sounds: Normal breath sounds.  Abdominal:     General: Bowel sounds are normal. There is no distension.     Palpations: Abdomen is soft. There is no mass.     Tenderness: There is no abdominal tenderness. There is no guarding or rebound.  Musculoskeletal:        General: No deformity.     Cervical back: Normal range of motion and neck supple.  Skin:    General: Skin is warm.  Neurological:     Mental Status: He is oriented to person, place, and time.     ED Results / Procedures / Treatments   Labs (all labs ordered are listed, but only abnormal results are displayed) Labs Reviewed  COMPREHENSIVE METABOLIC PANEL - Abnormal; Notable for the following components:      Result Value   Chloride 96 (*)    Glucose, Bld 305 (*)    BUN 21 (*)    All other components within normal limits  CBC WITH DIFFERENTIAL/PLATELET - Abnormal; Notable for the following components:   WBC 15.5 (*)    Neutro Abs 12.7 (*)    Abs Immature Granulocytes 0.10 (*)    All other components within normal limits  SALICYLATE LEVEL - Abnormal; Notable for the following components:   Salicylate Lvl <7.0 (*)    All other components within normal limits  ACETAMINOPHEN LEVEL - Abnormal; Notable for the following components:   Acetaminophen (Tylenol), Serum <10 (*)    All other components within normal limits  RESP PANEL BY RT-PCR (FLU A&B, COVID) ARPGX2  ETHANOL  RAPID URINE DRUG SCREEN, HOSP PERFORMED    EKG EKG Interpretation  Date/Time:  Sunday April 09 2020 14:30:17 EST Ventricular Rate:  100 PR Interval:    QRS Duration: 96 QT Interval:  335 QTC Calculation: 432 R Axis:   70 Text Interpretation: Sinus tachycardia Probable left atrial  enlargement No acute changes No significant change since last tracing Confirmed by Derwood Kaplan ([id number]) on 04/09/2020 3:25:08 PM   Radiology DG Chest Port 1 View  Result Date: 04/09/2020 CLINICAL DATA:  Cough. Patient found unconscious and given Narcan with response. Recent heroin use. EXAM: PORTABLE CHEST 1 VIEW COMPARISON:  06/07/2015 FINDINGS: Patient is rotated to the right. Lungs are hypoinflated without lobar consolidation or effusion. Subtle hazy prominence of the perihilar vessels likely due to the degree of hypoinflation and rotation. No lobar consolidation or effusion. Cardiomediastinal  silhouette and remainder the exam is unchanged. IMPRESSION: Hypoinflation without acute disease. Electronically Signed   By: Elberta Fortis M.D.   On: 04/09/2020 15:13    Procedures .Critical Care Performed by: Derwood Kaplan, MD Authorized by: Derwood Kaplan, MD   Critical care provider statement:    Critical care time (minutes):  52   Critical care was necessary to treat or prevent imminent or life-threatening deterioration of the following conditions:  Respiratory failure and toxidrome   Critical care was time spent personally by me on the following activities:  Discussions with consultants, evaluation of patient's response to treatment, examination of patient, ordering and performing treatments and interventions, ordering and review of laboratory studies, ordering and review of radiographic studies, pulse oximetry, re-evaluation of patient's condition, obtaining history from patient or surrogate and review of old charts   (including critical care time)  Medications Ordered in ED Medications  naloxone (NARCAN) injection 0.4 mg (has no administration in time range)  naloxone [facility]) injection 0.4 mg (0.4 mg Intravenous Given 04/09/20 1429)    ED Course  I have reviewed the triage vital signs and the nursing notes.  Pertinent labs & imaging results that were available during my care of  the patient were reviewed by me and considered in my medical decision making (see chart for details).  Clinical Course as of Apr 10 1807  Wynelle Link Apr 09, 2020  1625 Patient continues to argue against using the oxygen per nasal cannula.  He has agreed to a nonrebreather mask. He has received 0.4 of Narcan thus far.   [AN]  1805 UDS is cocaine positive. Patient's mental status has been normal. He is AO x3 demonstrating good capacity. He has wanted to go home for a while now. He had stayed for at least 2 hours for our request. Now he wants to leave AMA.  UDS is cocaine positive. No opiates. We discussed with him that his O2 sats have been in the eighties. It is concerning because we have not completed full work-up for it. We would want to proceed with CT scan to ensure there is no lung injury, vascular issues. Patient has declined. He reports that he has no chest pain, back pain. He is having some abdominal discomfort but, he thinks " he will be fine".  Patient will sign out AMA.  COCAINE(!): POSITIVE [AN]    Clinical Course User Index [AN] Derwood Kaplan, MD   MDM Rules/Calculators/A&P                         [name] was evaluated in Emergency Department on 04/09/2020 for the symptoms described in the history of present illness. He was evaluated in the context of the global COVID-19 pandemic, which necessitated consideration that the patient might be at risk for infection with the SARS-CoV-2 virus that causes COVID-19. Institutional protocols and algorithms that pertain to the evaluation of patients at risk for COVID-19 are in a state of rapid change based on information released by regulatory bodies including the CDC and federal and state organizations. These policies and algorithms were followed during the patient's care in the ED.    34 year old comes in w/ chief complaint of overdose.  Patient has history of polysubstance abuse and schizophrenia.  He admits to using some powder  earlier today, which he thinks was heroin.  He had received 2 mg of intranasal Narcan.  Patient is arousable in the ED.  His O2 sats are dropping into low  2180s.  Patient is refusing to wear oxygen per nasal cannula.  Hypoxia likely because of his overdose and resultant apnea.  End-tidal CO2 ordered. Additionally, aspiration and chronic lung disease -like sleep apnea also possible.  COVID-19 also in the differential as patient is having some cough.    Patient wants to leave against medical advice. Patient understands that his actions will lead to inadequate medical workup, and that he/she is at risk of complications of missed diagnosis, which includes morbidity and mortality.  Alternative options discussed : CT scan and then leave. Opportunity to change mind given. Discussion witnessed by RN. Patient is demonstrating good capacity to make decision. Patient understands that he needs to return to the ER immediately if his symptoms get worse. Advised pcp follow up for OSA eval, which is what he is concerned about.  Nurse also had AMA discussion - pt aware that he can die from resp arrest or have stroke/MI.   Final Clinical Impression(s) / ED Diagnoses Final diagnoses:  Accidental overdose of heroin, initial encounter (HCC)  Acute hypoxemic respiratory failure [facility](HCC)    Rx / DC Orders ED Discharge Orders    None       Derwood KaplanNanavati, Sakari Raisanen, MD 04/09/20 1626    Derwood KaplanNanavati, Jalaiyah Throgmorton, MD 04/09/20 1808

## 2020-04-09 NOTE — ED Notes (Signed)
RN went to patients room and found he had ripped out his IV and stating he wanted to leave now. Patient reports he was leaving. MD states this will be leaving AMA. Patient had called his mother. Patient's mother reports she is on her way and will speak to the MD when she arrives. Patient's O2 saturation on room air 88-90%. Patient is not considered safe for discharge.

## 2020-04-09 NOTE — ED Notes (Signed)
Request received from pt mother Kathryne Gin [phone] requesting pt status/updates when available. [employer]

## 2020-04-09 NOTE — ED Notes (Addendum)
Pt has become aggitated and refusing to leave his O2 on. RN and Clinical research associate have explained multiple times why it is important to leave O2 on. Pt states " I don't care if I can't breath, I don't care if I die. Just leave me alone". He also was explained what could happen if his oxygen continues to decrease. Pt stated " I will kill myself before that happens". MD has been notified of the situation.

## 2020-08-09 ENCOUNTER — Ambulatory Visit (HOSPITAL_COMMUNITY)
Admission: EM | Admit: 2020-08-09 | Discharge: 2020-08-09 | Disposition: A | Discharge status: Home / Self Care | Payer: Medicare Other | Attending: Psychiatry | Admitting: Psychiatry

## 2020-08-09 DIAGNOSIS — F250 Schizoaffective disorder, bipolar type: Secondary | ICD-10-CM | POA: Insufficient documentation

## 2020-08-09 DIAGNOSIS — Z5900 Homelessness unspecified: Secondary | ICD-10-CM | POA: Insufficient documentation

## 2020-08-09 NOTE — ED Notes (Signed)
[employer] transport requested to home destination.  Pt verbalized understanding of DC instructions.  Left [facility] A&O x 4, no distress, gait steady.

## 2020-08-09 NOTE — ED Provider Notes (Signed)
Behavioral Health Urgent Care Medical Screening Exam  Patient Name: [name] MRN: [id number] Date of Evaluation: 08/09/20 Chief Complaint: Chief Complaint/Presenting Problem: Pt states he's stressed due to legal matters, problems at home, and being homeless. Diagnosis:  Final diagnoses:  Schizoaffective disorder, bipolar type (HCC)    History of Present illness: [name] is a 35 y.o. male with a history of schizoaffective disorder who presents to [facility] voluntarily with Patent examiner. Patient states that he contacted law enforcement because "I need a new start in life." States that he has been sleeping on his mother's sofa, but recently decided top leave because the apartment is not big enough. He denies suicidal ideations. He denies homicidal ideations. He reports auditory hallucinations of hearing various noises. He denies visual hallucinations. No indication that he is responding to internal stimuli. He reports that he receives prolixin injection every 2 weeks through ACT services provided by [employer].  Psychiatric Specialty Exam  Presentation  General Appearance:Casual; Neat; Appropriate for Environment  Eye Contact:Good  Speech:Clear and Coherent; Normal Rate  Speech Volume:Normal  Handedness:Right   Mood and Affect  Mood:Euthymic  Affect:Congruent   Thought Process  Thought Processes:Coherent  Descriptions of Associations:Intact  Orientation:Full (Time, Place and Person)  Thought Content:Logical  Diagnosis of Schizophrenia or Schizoaffective disorder in past: Yes  Duration of Psychotic Symptoms: Greater than six months  Hallucinations:Auditory patient reports hearing random noises  Ideas of Reference:None  Suicidal Thoughts:No  Homicidal Thoughts:No   Sensorium  Memory:Immediate Good; Recent Good  Judgment:Fair  Insight:Fair   Executive Functions  Concentration:Fair  Attention Span:Fair  Recall:Fair  Fund of  Knowledge:Fair  Language:Good   Psychomotor Activity  Psychomotor Activity:Normal   Assets  Assets:Communication Skills; Desire for Improvement; Financial Resources/Insurance; Social Support; Physical Health   Sleep  Sleep:Good  Number of hours: No data recorded  No data recorded  Physical Exam: Physical Exam Constitutional:      General: He is not in acute distress.    Appearance: He is not ill-appearing, toxic-appearing or diaphoretic.  HENT:     Head: Normocephalic.     Right Ear: External ear normal.     Left Ear: External ear normal.  Eyes:     Pupils: Pupils are equal, round, and reactive to light.  Cardiovascular:     Rate and Rhythm: Normal rate.  Pulmonary:     Effort: Pulmonary effort is normal. No respiratory distress.  Musculoskeletal:        General: Normal range of motion.  Skin:    General: Skin is warm and dry.  Neurological:     Mental Status: He is alert and oriented to person, place, and time.  Psychiatric:        Mood and Affect: Mood is not depressed.        Speech: Speech normal.        Behavior: Behavior is cooperative.        Thought Content: Thought content is not paranoid or delusional. Thought content does not include homicidal or suicidal ideation. Thought content does not include suicidal plan.    Review of Systems  Constitutional: Negative for chills, diaphoresis, fever, malaise/fatigue and weight loss.  HENT: Negative for congestion.   Respiratory: Negative for cough and shortness of breath.   Cardiovascular: Negative for chest pain and palpitations.  Gastrointestinal: Negative for diarrhea, nausea and vomiting.  Neurological: Negative for dizziness and seizures.  Psychiatric/Behavioral: Positive for depression and hallucinations. Negative for memory loss, substance abuse and suicidal ideas. The patient has  insomnia. The patient is not nervous/anxious.   All other systems reviewed and are negative.  Blood pressure (!) 145/88,  pulse 95, temperature (!) 97.5 F (36.4 C), temperature source Tympanic, resp. rate 18, SpO2 100 %. There is no height or weight on file to calculate BMI.  Musculoskeletal: Strength & Muscle Tone: within normal limits Gait & Station: normal Patient leans: N/A   [facility] MSE Discharge Disposition for Follow up and Recommendations: Based on my evaluation the patient does not appear to have an emergency medical condition and can be discharged with resources and follow up care in outpatient services for Medication Management and Individual Therapy   Recommend to follow up with [facility]. Provided with resource guide for shelters   Jackelyn Poling, NP 08/09/2020, 6:00 AM
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
## 2020-08-09 NOTE — Discharge Instructions (Addendum)

## 2020-08-11 ENCOUNTER — Telehealth (HOSPITAL_COMMUNITY): Payer: Self-pay | Admitting: Family Medicine

## 2020-08-11 NOTE — BH Assessment (Signed)
Care Management - Follow Up [facility] Discharges   Writer attempted to make contact with patient today and was unsuccessful.  Writer was able to leave a HIPPA compliant voice message and will await callback.   

## 2020-11-19 ENCOUNTER — Other Ambulatory Visit: Payer: Self-pay

## 2020-11-19 ENCOUNTER — Emergency Department (HOSPITAL_COMMUNITY)
Admission: EM | Admit: 2020-11-19 | Discharge: 2020-11-20 | Disposition: A | Discharge status: Home / Self Care | Payer: Medicare Other | Attending: Emergency Medicine | Admitting: Emergency Medicine

## 2020-11-19 ENCOUNTER — Encounter (HOSPITAL_COMMUNITY): Payer: Self-pay

## 2020-11-19 DIAGNOSIS — Z79899 Other long term (current) drug therapy: Secondary | ICD-10-CM | POA: Insufficient documentation

## 2020-11-19 DIAGNOSIS — F259 Schizoaffective disorder, unspecified: Secondary | ICD-10-CM | POA: Diagnosis present

## 2020-11-19 DIAGNOSIS — F209 Schizophrenia, unspecified: Secondary | ICD-10-CM | POA: Insufficient documentation

## 2020-11-19 DIAGNOSIS — F99 Mental disorder, not otherwise specified: Secondary | ICD-10-CM | POA: Insufficient documentation

## 2020-11-19 DIAGNOSIS — Z5900 Homelessness unspecified: Secondary | ICD-10-CM | POA: Insufficient documentation

## 2020-11-19 HISTORY — DX: Anxiety disorder, unspecified: F41.9

## 2020-11-19 HISTORY — DX: Depression, unspecified: F32.A

## 2020-11-19 HISTORY — DX: Schizophrenia, unspecified: F20.9
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
## 2020-11-20 DIAGNOSIS — F259 Schizoaffective disorder, unspecified: Secondary | ICD-10-CM | POA: Diagnosis not present

## 2020-11-20 LAB — COMPREHENSIVE METABOLIC PANEL
ALT: 35 U/L (ref 0–44)
AST: 24 U/L (ref 15–41)
Albumin: 4.4 g/dL (ref 3.5–5.0)
Alkaline Phosphatase: 92 U/L (ref 38–126)
Anion gap: 11 (ref 5–15)
BUN: 9 mg/dL (ref 6–20)
CO2: 25 mmol/L (ref 22–32)
Calcium: 9.6 mg/dL (ref 8.9–10.3)
Chloride: 100 mmol/L (ref 98–111)
Creatinine, Ser: 0.81 mg/dL (ref 0.61–1.24)
GFR, Estimated: 59 mL/min — ABNORMAL LOW (ref >60)
Glucose, Bld: 191 mg/dL — ABNORMAL HIGH (ref 70–99)
Potassium: 3.4 mmol/L — ABNORMAL LOW (ref 3.5–5.1)
Sodium: 136 mmol/L (ref 135–145)
Total Bilirubin: 0.6 mg/dL (ref 0.3–1.2)
Total Protein: 7.7 g/dL (ref 6.5–8.1)

## 2020-11-20 LAB — ETHANOL: Alcohol, Ethyl (B): <10 mg/dL (ref <10)

## 2020-11-20 LAB — CBC WITH DIFFERENTIAL/PLATELET
Abs Immature Granulocytes: 0.08 10*3/uL — ABNORMAL HIGH (ref 0.00–0.07)
Basophils Absolute: 0.1 10*3/uL (ref 0.0–0.1)
Basophils Relative: 1 %
Eosinophils Absolute: 0.4 10*3/uL (ref 0.0–0.5)
Eosinophils Relative: 3 %
HCT: 45.3 % (ref 39.0–52.0)
Hemoglobin: 16 g/dL (ref 13.0–17.0)
Immature Granulocytes: 1 %
Lymphocytes Relative: 34 %
Lymphs Abs: 4 10*3/uL (ref 0.7–4.0)
MCH: 29.8 pg (ref 26.0–34.0)
MCHC: 35.3 g/dL (ref 30.0–36.0)
MCV: 84.4 fL (ref 80.0–100.0)
Monocytes Absolute: 1 10*3/uL (ref 0.1–1.0)
Monocytes Relative: 9 %
Neutro Abs: 6.2 10*3/uL (ref 1.7–7.7)
Neutrophils Relative %: 52 %
Platelets: 299 10*3/uL (ref 150–400)
RBC: 5.37 MIL/uL (ref 4.22–5.81)
RDW: 11.8 % (ref 11.5–15.5)
WBC: 11.6 10*3/uL — ABNORMAL HIGH (ref 4.0–10.5)
nRBC: 0 % (ref 0.0–0.2)

## 2020-11-20 LAB — RAPID URINE DRUG SCREEN, HOSP PERFORMED
Amphetamines: NOT DETECTED
Barbiturates: NOT DETECTED
Benzodiazepines: NOT DETECTED
Cocaine: POSITIVE — AB
Opiates: NOT DETECTED
Tetrahydrocannabinol: NOT DETECTED

## 2020-11-20 LAB — ACETAMINOPHEN LEVEL: Acetaminophen (Tylenol), Serum: <10 ug/mL — ABNORMAL LOW (ref 10–30)

## 2020-11-20 LAB — SALICYLATE LEVEL: Salicylate Lvl: <7 mg/dL — ABNORMAL LOW (ref 7.0–30.0)
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
## 2020-11-20 NOTE — Discharge Instructions (Signed)
For your behavioral health needs you are advised to continue treatment with the [facility] ACT Team:       [facility]      [address]      [place], [place] [address]      [phone] 

## 2020-11-20 NOTE — BH Assessment (Signed)
BHH Assessment Progress Note   Per Dorena Bodo, NP, this voluntary pt, also known as [name] (to be marked for merge), does not require psychiatric hospitalization at this time.  Pt is psychiatrically cleared.  Discharge instructions advise pt to continue treatment with the [facility] ACT Team.  At 11:18 I called them and spoke to [employer].  He reports that he will not be able pick pt up at [facility] today, but will follow up with pt in the community tomorrow.  EDP Lorre Nick, MD and pt's nurse, I-Li, have been notified.  Doylene Canning, MA Triage Specialist [phone]
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
## 2020-11-20 NOTE — ED Notes (Signed)
Patient to give urine sample after he eats breakfast.

## 2020-11-20 NOTE — BH Assessment (Addendum)
BHH Assessment Progress Note   Note authored by Duard Brady, TS this morning asserts that pt receives ACT Team services from [facility].  This Clinical research associate called them this morning to verify.  They report that pt is not an ACT Team client of theirs, nor does he receive any other services from them.  After checking pt's address in his registration record, I then called the three other ACT Teams serving [facility] residents ([facility], [place], [place]).  All of them deny that pt is on their ACT Teams.  Will call the [facility] to see if they have any information.  Doylene Canning, [place] Behavioral Health Coordinator [phone]  Addendum:  At 10:26 I called the [facility] and spoke to [facility].  She reports that there is no record in their system of pt receiving services through the [facility].  This information has been shared with Dorena Bodo, NP and Pollyann Glen, TS.  Doylene Canning, [place] Behavioral Health Coordinator [phone]
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
## 2020-11-20 NOTE — BH Assessment (Signed)
Clinician messaged Lorie Apley, RN is the pt is able to be assessed, if so is the pt under IVC?   Per Milagros Loll, RN pt is sleeping and unable to engage in TTS assessment. RN to contact TTS when the pt is able to engage in assessment.   Redmond Pulling, MS, [facility], [facility] Triage Specialist [phone]

## 2020-11-20 NOTE — ED Notes (Signed)
Per TTS: Roselyn Bering, NP, reviewed pt's chart and information and determined pt should continue to be observed until his UDS returns and contact can be made with his ACT Team, [employer].

## 2020-11-21 ENCOUNTER — Encounter (HOSPITAL_COMMUNITY): Payer: Self-pay

## 2020-12-02 ENCOUNTER — Emergency Department (HOSPITAL_COMMUNITY): Payer: Medicare Other

## 2020-12-02 ENCOUNTER — Inpatient Hospital Stay (HOSPITAL_COMMUNITY): Payer: Medicare Other

## 2020-12-02 ENCOUNTER — Encounter (HOSPITAL_COMMUNITY): Payer: Self-pay

## 2020-12-02 ENCOUNTER — Other Ambulatory Visit: Payer: Self-pay

## 2020-12-02 ENCOUNTER — Inpatient Hospital Stay (HOSPITAL_COMMUNITY)
Admission: EM | Admit: 2020-12-02 | Discharge: 2020-12-08 | DRG: 871 | Disposition: A | Discharge status: Home / Self Care | Payer: Medicare Other | Attending: Internal Medicine | Admitting: Internal Medicine

## 2020-12-02 DIAGNOSIS — Z6832 Body mass index (BMI) 32.0-32.9, adult: Secondary | ICD-10-CM

## 2020-12-02 DIAGNOSIS — J690 Pneumonitis due to inhalation of food and vomit: Secondary | ICD-10-CM

## 2020-12-02 DIAGNOSIS — R0602 Shortness of breath: Secondary | ICD-10-CM

## 2020-12-02 DIAGNOSIS — J9601 Acute respiratory failure with hypoxia: Secondary | ICD-10-CM | POA: Diagnosis present

## 2020-12-02 DIAGNOSIS — E875 Hyperkalemia: Secondary | ICD-10-CM | POA: Diagnosis not present

## 2020-12-02 DIAGNOSIS — E1165 Type 2 diabetes mellitus with hyperglycemia: Secondary | ICD-10-CM | POA: Diagnosis present

## 2020-12-02 DIAGNOSIS — R0902 Hypoxemia: Secondary | ICD-10-CM | POA: Diagnosis present

## 2020-12-02 DIAGNOSIS — F32A Depression, unspecified: Secondary | ICD-10-CM | POA: Diagnosis present

## 2020-12-02 DIAGNOSIS — F259 Schizoaffective disorder, unspecified: Secondary | ICD-10-CM | POA: Diagnosis present

## 2020-12-02 DIAGNOSIS — F419 Anxiety disorder, unspecified: Secondary | ICD-10-CM | POA: Diagnosis present

## 2020-12-02 DIAGNOSIS — Z20822 Contact with and (suspected) exposure to covid-19: Secondary | ICD-10-CM | POA: Diagnosis present

## 2020-12-02 DIAGNOSIS — T40601A Poisoning by unspecified narcotics, accidental (unintentional), initial encounter: Secondary | ICD-10-CM | POA: Diagnosis not present

## 2020-12-02 DIAGNOSIS — T401X1A Poisoning by heroin, accidental (unintentional), initial encounter: Secondary | ICD-10-CM | POA: Diagnosis present

## 2020-12-02 DIAGNOSIS — E871 Hypo-osmolality and hyponatremia: Secondary | ICD-10-CM | POA: Diagnosis present

## 2020-12-02 DIAGNOSIS — J9811 Atelectasis: Secondary | ICD-10-CM | POA: Diagnosis present

## 2020-12-02 DIAGNOSIS — I10 Essential (primary) hypertension: Secondary | ICD-10-CM | POA: Diagnosis present

## 2020-12-02 DIAGNOSIS — Z9114 Patient's other noncompliance with medication regimen: Secondary | ICD-10-CM | POA: Diagnosis not present

## 2020-12-02 DIAGNOSIS — E669 Obesity, unspecified: Secondary | ICD-10-CM | POA: Diagnosis present

## 2020-12-02 DIAGNOSIS — F1410 Cocaine abuse, uncomplicated: Secondary | ICD-10-CM | POA: Diagnosis present

## 2020-12-02 DIAGNOSIS — T50904A Poisoning by unspecified drugs, medicaments and biological substances, undetermined, initial encounter: Secondary | ICD-10-CM

## 2020-12-02 DIAGNOSIS — G929 Unspecified toxic encephalopathy: Secondary | ICD-10-CM | POA: Diagnosis present

## 2020-12-02 DIAGNOSIS — R410 Disorientation, unspecified: Secondary | ICD-10-CM | POA: Diagnosis not present

## 2020-12-02 DIAGNOSIS — J189 Pneumonia, unspecified organism: Secondary | ICD-10-CM

## 2020-12-02 DIAGNOSIS — Z79899 Other long term (current) drug therapy: Secondary | ICD-10-CM | POA: Diagnosis not present

## 2020-12-02 DIAGNOSIS — A419 Sepsis, unspecified organism: Secondary | ICD-10-CM | POA: Diagnosis present

## 2020-12-02 DIAGNOSIS — E876 Hypokalemia: Secondary | ICD-10-CM | POA: Diagnosis present

## 2020-12-02 DIAGNOSIS — Z720 Tobacco use: Secondary | ICD-10-CM

## 2020-12-02 DIAGNOSIS — F209 Schizophrenia, unspecified: Secondary | ICD-10-CM | POA: Diagnosis not present

## 2020-12-02 DIAGNOSIS — R6520 Severe sepsis without septic shock: Secondary | ICD-10-CM | POA: Diagnosis not present

## 2020-12-02 DIAGNOSIS — T401X4A Poisoning by heroin, undetermined, initial encounter: Secondary | ICD-10-CM | POA: Diagnosis not present

## 2020-12-02 DIAGNOSIS — T40604A Poisoning by unspecified narcotics, undetermined, initial encounter: Principal | ICD-10-CM

## 2020-12-02 LAB — I-STAT VENOUS BLOOD GAS, ED
Acid-base deficit: 2 mmol/L (ref 0.0–2.0)
Bicarbonate: 24 mmol/L (ref 20.0–28.0)
Calcium, Ion: 1.04 mmol/L — ABNORMAL LOW (ref 1.15–1.40)
HCT: 49 % (ref 39.0–52.0)
Hemoglobin: 16.7 g/dL (ref 13.0–17.0)
O2 Saturation: 85 %
Potassium: 4.3 mmol/L (ref 3.5–5.1)
Sodium: 134 mmol/L — ABNORMAL LOW (ref 135–145)
TCO2: 25 mmol/L (ref 22–32)
pCO2, Ven: 43.4 mmHg — ABNORMAL LOW (ref 44.0–60.0)
pH, Ven: 7.351 (ref 7.250–7.430)
pO2, Ven: 53 mmHg — ABNORMAL HIGH (ref 32.0–45.0)

## 2020-12-02 LAB — COMPREHENSIVE METABOLIC PANEL
ALT: 33 U/L (ref 0–44)
ALT: 35 U/L (ref 0–44)
AST: 28 U/L (ref 15–41)
AST: 28 U/L (ref 15–41)
Albumin: 3.6 g/dL (ref 3.5–5.0)
Albumin: 4.1 g/dL (ref 3.5–5.0)
Alkaline Phosphatase: 86 U/L (ref 38–126)
Alkaline Phosphatase: 90 U/L (ref 38–126)
Anion gap: 13 (ref 5–15)
Anion gap: 11 (ref 5–15)
BUN: 9 mg/dL (ref 6–20)
BUN: 9 mg/dL (ref 6–20)
CO2: 24 mmol/L (ref 22–32)
CO2: 28 mmol/L (ref 22–32)
Calcium: 9.1 mg/dL (ref 8.9–10.3)
Calcium: 9.1 mg/dL (ref 8.9–10.3)
Chloride: 95 mmol/L — ABNORMAL LOW (ref 98–111)
Chloride: 96 mmol/L — ABNORMAL LOW (ref 98–111)
Creatinine, Ser: 1.25 mg/dL — ABNORMAL HIGH (ref 0.61–1.24)
Creatinine, Ser: 1.19 mg/dL (ref 0.61–1.24)
GFR, Estimated: >60 mL/min (ref >60)
GFR, Estimated: >60 mL/min (ref >60)
Glucose, Bld: 434 mg/dL — ABNORMAL HIGH (ref 70–99)
Glucose, Bld: 299 mg/dL — ABNORMAL HIGH (ref 70–99)
Potassium: 4.2 mmol/L (ref 3.5–5.1)
Potassium: 4.4 mmol/L (ref 3.5–5.1)
Sodium: 132 mmol/L — ABNORMAL LOW (ref 135–145)
Sodium: 135 mmol/L (ref 135–145)
Total Bilirubin: 0.4 mg/dL (ref 0.3–1.2)
Total Bilirubin: 0.6 mg/dL (ref 0.3–1.2)
Total Protein: 7 g/dL (ref 6.5–8.1)
Total Protein: 7.7 g/dL (ref 6.5–8.1)

## 2020-12-02 LAB — RESP PANEL BY RT-PCR (FLU A&B, COVID) ARPGX2
Influenza A by PCR: NEGATIVE
Influenza B by PCR: NEGATIVE
SARS Coronavirus 2 by RT PCR: NEGATIVE

## 2020-12-02 LAB — CBC WITH DIFFERENTIAL/PLATELET
Abs Immature Granulocytes: 0.24 10*3/uL — ABNORMAL HIGH (ref 0.00–0.07)
Basophils Absolute: 0.1 10*3/uL (ref 0.0–0.1)
Basophils Relative: 1 %
Eosinophils Absolute: 0.3 10*3/uL (ref 0.0–0.5)
Eosinophils Relative: 2 %
HCT: 46.1 % (ref 39.0–52.0)
Hemoglobin: 16.2 g/dL (ref 13.0–17.0)
Immature Granulocytes: 2 %
Lymphocytes Relative: 23 %
Lymphs Abs: 3.6 10*3/uL (ref 0.7–4.0)
MCH: 29.9 pg (ref 26.0–34.0)
MCHC: 35.1 g/dL (ref 30.0–36.0)
MCV: 85.2 fL (ref 80.0–100.0)
Monocytes Absolute: 1.1 10*3/uL — ABNORMAL HIGH (ref 0.1–1.0)
Monocytes Relative: 7 %
Neutro Abs: 10.3 10*3/uL — ABNORMAL HIGH (ref 1.7–7.7)
Neutrophils Relative %: 65 %
Platelets: 366 10*3/uL (ref 150–400)
RBC: 5.41 MIL/uL (ref 4.22–5.81)
RDW: 11.6 % (ref 11.5–15.5)
WBC: 15.6 10*3/uL — ABNORMAL HIGH (ref 4.0–10.5)
nRBC: 0 % (ref 0.0–0.2)

## 2020-12-02 LAB — CBC
HCT: 48.8 % (ref 39.0–52.0)
Hemoglobin: 16.5 g/dL (ref 13.0–17.0)
MCH: 29.9 pg (ref 26.0–34.0)
MCHC: 33.8 g/dL (ref 30.0–36.0)
MCV: 88.4 fL (ref 80.0–100.0)
Platelets: 305 10*3/uL (ref 150–400)
RBC: 5.52 MIL/uL (ref 4.22–5.81)
RDW: 11.6 % (ref 11.5–15.5)
WBC: 22.8 10*3/uL — ABNORMAL HIGH (ref 4.0–10.5)
nRBC: 0 % (ref 0.0–0.2)

## 2020-12-02 LAB — LACTIC ACID, PLASMA
Lactic Acid, Venous: 3.1 mmol/L (ref 0.5–1.9)
Lactic Acid, Venous: 4.4 mmol/L (ref 0.5–1.9)

## 2020-12-02 LAB — HIV ANTIBODY (ROUTINE TESTING W REFLEX): HIV Screen 4th Generation wRfx: NONREACTIVE

## 2020-12-02 LAB — MAGNESIUM: Magnesium: 1.9 mg/dL (ref 1.7–2.4)

## 2020-12-02 LAB — PHOSPHORUS: Phosphorus: 6.1 mg/dL — ABNORMAL HIGH (ref 2.5–4.6)

## 2020-12-02 LAB — CBG MONITORING, ED: Glucose-Capillary: 301 mg/dL — ABNORMAL HIGH (ref 70–99)

## 2020-12-02 LAB — HEMOGLOBIN A1C
Hgb A1c MFr Bld: 8.8 % — ABNORMAL HIGH (ref 4.8–5.6)
Mean Plasma Glucose: 205.86 mg/dL

## 2020-12-02 MED ORDER — HYDROXYZINE HCL 25 MG PO TABS
25.0000 mg | ORAL_TABLET | Freq: Three times a day (TID) | ORAL | Status: DC | PRN
  Administered 2020-12-02 – 2020-12-05 (×2): 25 mg via ORAL
  Filled 2020-12-02 (×2): qty 1

## 2020-12-02 MED ORDER — ENOXAPARIN SODIUM 40 MG/0.4ML IJ SOSY
40.0000 mg | PREFILLED_SYRINGE | INTRAMUSCULAR | Status: DC
  Administered 2020-12-03 – 2020-12-07 (×3): 40 mg via SUBCUTANEOUS
  Filled 2020-12-02 (×5): qty 0.4

## 2020-12-02 MED ORDER — INSULIN ASPART 100 UNIT/ML IJ SOLN
0.0000 [IU] | Freq: Three times a day (TID) | INTRAMUSCULAR | Status: DC
  Administered 2020-12-03: 5 [IU] via SUBCUTANEOUS
  Administered 2020-12-04 (×2): 3 [IU] via SUBCUTANEOUS
  Administered 2020-12-05 (×2): 2 [IU] via SUBCUTANEOUS
  Administered 2020-12-07: 3 [IU] via SUBCUTANEOUS
  Administered 2020-12-07 – 2020-12-08 (×3): 2 [IU] via SUBCUTANEOUS

## 2020-12-02 MED ORDER — METOPROLOL TARTRATE 5 MG/5ML IV SOLN
5.0000 mg | Freq: Once | INTRAVENOUS | Status: AC
  Administered 2020-12-02: 5 mg via INTRAVENOUS
  Filled 2020-12-02: qty 5

## 2020-12-02 MED ORDER — ONDANSETRON HCL 4 MG/2ML IJ SOLN: 4.0000 mg | Freq: Four times a day (QID) | INTRAMUSCULAR | Status: DC | PRN

## 2020-12-02 MED ORDER — SODIUM CHLORIDE 0.9 % IV SOLN
3.0000 g | Freq: Four times a day (QID) | INTRAVENOUS | Status: DC
  Administered 2020-12-02 – 2020-12-08 (×22): 3 g via INTRAVENOUS
  Filled 2020-12-02 (×3): qty 8
  Filled 2020-12-02: qty 3
  Filled 2020-12-02 (×5): qty 8
  Filled 2020-12-02: qty 3
  Filled 2020-12-02 (×3): qty 8
  Filled 2020-12-02: qty 3
  Filled 2020-12-02: qty 8
  Filled 2020-12-02 (×2): qty 3
  Filled 2020-12-02: qty 8
  Filled 2020-12-02: qty 3
  Filled 2020-12-02 (×2): qty 8
  Filled 2020-12-02: qty 3
  Filled 2020-12-02: qty 8
  Filled 2020-12-02: qty 3
  Filled 2020-12-02 (×3): qty 8
  Filled 2020-12-02: qty 3

## 2020-12-02 MED ORDER — ACETAMINOPHEN 650 MG RE SUPP: 650.0000 mg | RECTAL | Status: DC | PRN

## 2020-12-02 MED ORDER — NALOXONE HCL 0.4 MG/ML IJ SOLN: 0.4000 mg | INTRAMUSCULAR | Status: DC | PRN

## 2020-12-02 MED ORDER — LORAZEPAM 2 MG/ML IJ SOLN
1.0000 mg | INTRAMUSCULAR | Status: DC | PRN
  Administered 2020-12-03 – 2020-12-05 (×18): 2 mg via INTRAVENOUS
  Filled 2020-12-02: qty 1
  Filled 2020-12-02: qty 2
  Filled 2020-12-02 (×11): qty 1
  Filled 2020-12-02: qty 2
  Filled 2020-12-02 (×3): qty 1

## 2020-12-02 MED ORDER — LORAZEPAM 1 MG PO TABS: 1.0000 mg | ORAL_TABLET | ORAL | Status: DC | PRN

## 2020-12-02 MED ORDER — SODIUM CHLORIDE 0.9 % IV SOLN: INTRAVENOUS | Status: AC

## 2020-12-02 MED ORDER — IPRATROPIUM-ALBUTEROL 0.5-2.5 (3) MG/3ML IN SOLN
3.0000 mL | Freq: Four times a day (QID) | RESPIRATORY_TRACT | Status: DC
  Administered 2020-12-03 (×4): 3 mL via RESPIRATORY_TRACT
  Filled 2020-12-02 (×4): qty 3

## 2020-12-02 MED ORDER — GUAIFENESIN ER 600 MG PO TB12
1200.0000 mg | ORAL_TABLET | Freq: Two times a day (BID) | ORAL | Status: DC
  Administered 2020-12-07 (×2): 1200 mg via ORAL
  Filled 2020-12-02 (×2): qty 2

## 2020-12-02 MED ORDER — OLANZAPINE 10 MG IM SOLR
10.0000 mg | Freq: Once | INTRAMUSCULAR | Status: AC
  Administered 2020-12-02: 10 mg via INTRAMUSCULAR
  Filled 2020-12-02 (×2): qty 10

## 2020-12-02 MED ORDER — INSULIN GLARGINE 100 UNIT/ML ~~LOC~~ SOLN
10.0000 [IU] | Freq: Once | SUBCUTANEOUS | Status: DC
  Filled 2020-12-02: qty 0.1

## 2020-12-02 MED ORDER — FOLIC ACID 1 MG PO TABS
1.0000 mg | ORAL_TABLET | Freq: Every day | ORAL | Status: DC
  Administered 2020-12-07 – 2020-12-08 (×2): 1 mg via ORAL
  Filled 2020-12-02 (×2): qty 1

## 2020-12-02 MED ORDER — LORAZEPAM 1 MG PO TABS
1.0000 mg | ORAL_TABLET | Freq: Once | ORAL | Status: AC
  Administered 2020-12-02: 1 mg via ORAL
  Filled 2020-12-02: qty 1

## 2020-12-02 MED ORDER — SODIUM CHLORIDE 0.9 % IV SOLN
1.5000 g | Freq: Once | INTRAVENOUS | Status: DC
  Filled 2020-12-02: qty 4

## 2020-12-02 MED ORDER — THIAMINE HCL 100 MG PO TABS
100.0000 mg | ORAL_TABLET | Freq: Every day | ORAL | Status: DC
  Administered 2020-12-07 – 2020-12-08 (×2): 100 mg via ORAL
  Filled 2020-12-02 (×2): qty 1

## 2020-12-02 MED ORDER — SODIUM CHLORIDE 0.9 % IV SOLN
3.0000 g | Freq: Four times a day (QID) | INTRAVENOUS | Status: DC
  Filled 2020-12-02 (×3): qty 8

## 2020-12-02 MED ORDER — THIAMINE HCL 100 MG/ML IJ SOLN
100.0000 mg | Freq: Every day | INTRAMUSCULAR | Status: DC
  Administered 2020-12-05: 100 mg via INTRAVENOUS
  Filled 2020-12-02: qty 2

## 2020-12-02 MED ORDER — OLANZAPINE 10 MG IM SOLR
10.0000 mg | Freq: Once | INTRAMUSCULAR | Status: AC
  Administered 2020-12-02: 10 mg via INTRAMUSCULAR
  Filled 2020-12-02: qty 10

## 2020-12-02 MED ORDER — LACTATED RINGERS IV BOLUS
1000.0000 mL | Freq: Once | INTRAVENOUS | Status: AC
  Administered 2020-12-02: 1000 mL via INTRAVENOUS

## 2020-12-02 MED ORDER — ONDANSETRON HCL 4 MG PO TABS: 4.0000 mg | ORAL_TABLET | Freq: Four times a day (QID) | ORAL | Status: DC | PRN

## 2020-12-02 MED ORDER — CLONIDINE HCL 0.1 MG PO TABS: 0.1000 mg | ORAL_TABLET | Freq: Three times a day (TID) | ORAL | Status: DC | PRN

## 2020-12-02 MED ORDER — QUETIAPINE FUMARATE 50 MG PO TABS
25.0000 mg | ORAL_TABLET | Freq: Two times a day (BID) | ORAL | Status: DC
  Administered 2020-12-03 – 2020-12-05 (×2): 25 mg via ORAL
  Filled 2020-12-02 (×3): qty 1

## 2020-12-02 MED ORDER — ADULT MULTIVITAMIN W/MINERALS CH
1.0000 | ORAL_TABLET | Freq: Every day | ORAL | Status: DC
  Administered 2020-12-07 – 2020-12-08 (×2): 1 via ORAL
  Filled 2020-12-02 (×2): qty 1

## 2020-12-02 NOTE — ED Notes (Signed)
Iv is not running

## 2020-12-02 NOTE — ED Provider Notes (Signed)
[facility]   CSN: [id number] Arrival date & time: 12/02/20  1454     History Chief Complaint  Patient presents with   Drug Overdose    [name] is a 35 y.o. male.   Drug Overdose  35 year old male PMHx HTN, polysubstance use, anxiety, depression, schizoaffective, bib [facility] from a smoke shop found down, with pinpoint pupils, agonal breathing, sats down to 45%.  EMS assisted breaths x20 minutes and administered Narcan x2 rounds.  Patient is somnolent but responsive to verbal.  Patient's mom who arrived later states that he has had difficulty with his schizoaffective disorder lately.  Remainder of history limited by patient's altered mentation, likely 2/2 substance use.  History obtained from patient (limited), patient's mom, EMS, chart review.     Past Medical History:  Diagnosis Date   Anxiety    Depression    Hypertension    Schizoaffective disorder (HCC)    Schizophrenia [facility](HCC)     Patient Active Problem List   Diagnosis Date Noted   Aspiration pneumonia (HCC) 12/02/2020   Hypoxia 12/02/2020   Opiate overdose (HCC)    Schizoaffective disorder (HCC) 11/20/2020   Substance induced mood disorder (HCC) 07/10/2019   Schizoaffective schizophrenia (HCC) 11/05/2018   Schizophrenia, paranoid, chronic (HCC) 06/08/2015   Schizoaffective disorder, bipolar type (HCC) 10/21/2014   Tobacco use disorder 10/21/2014   Cocaine use disorder, moderate, dependence (HCC) 10/21/2014   Cannabis use disorder, moderate, dependence (HCC) 10/21/2014    Past Surgical History:  Procedure Laterality Date   BACK SURGERY         Family History  Problem Relation Age of Onset   Allergies Mother    Asthma Mother     Social History   Tobacco Use   Smoking status: Unknown   Smokeless tobacco: Current  Vaping Use   Vaping Use: Never used  Substance Use Topics   Alcohol use: Never   Drug use: Never    Types: Marijuana     Comment: heroin    Home Medications Prior to Admission medications   Medication Sig Start Date End Date Taking? Authorizing Provider  fluPHENAZine decanoate (PROLIXIN) 25 MG/ML injection Inject 2 mLs (50 mg total) into the muscle every 14 (fourteen) days. 10/02/18  Yes Malvin JohnsFarah, Brian, MD  methocarbamol (ROBAXIN) 500 MG tablet Take 1 tablet (500 mg total) by mouth every 8 (eight) hours as needed for muscle spasms. Patient not taking: Reported on 12/02/2020 07/10/19   Chales AbrahamsMills, Shnese E, NP  naproxen (NAPROSYN) 500 MG tablet Take 1 tablet (500 mg total) by mouth 2 (two) times daily as needed (aching, pain, or discomfort). Patient not taking: Reported on 12/02/2020 07/10/19   Chales AbrahamsMills, Shnese E, NP  ondansetron (ZOFRAN-ODT) 4 MG disintegrating tablet Take 1 tablet (4 mg total) by mouth every 6 (six) hours as needed for nausea or vomiting. Patient not taking: Reported on 12/02/2020 07/10/19   Chales AbrahamsMills, Shnese E, NP  SEROQUEL 25 MG tablet Take 25 mg by mouth 2 (two) times daily. 11/06/20   [provider]    Allergies    Patient has no known allergies.  Review of Systems   Review of Systems  Unable to perform ROS: Mental status change   Physical Exam Updated Vital Signs BP 136/78   Pulse (!) 101   Temp (!) 100.5 F (38.1 C) (Oral)   Resp (!) 21   SpO2 96%   Physical Exam Vitals and nursing note reviewed.  Constitutional:  General: He is not in acute distress.    Comments: Somnolent  HENT:     Head: Normocephalic and atraumatic.     Nose: Nose normal.     Mouth/Throat:     Mouth: Mucous membranes are moist.     Pharynx: Oropharynx is clear.  Eyes:     Extraocular Movements: Extraocular movements intact.     Conjunctiva/sclera: Conjunctivae normal.     Pupils: Pupils are equal, round, and reactive to light.     Comments: Pinpoint pupils  Cardiovascular:     Rate and Rhythm: Normal rate and regular rhythm.     Heart sounds: No murmur heard.   No friction rub. No gallop.   Pulmonary:     Effort: Pulmonary effort is normal. No respiratory distress.     Comments: Scattered wheezes and coarse breath sounds Abdominal:     General: There is no distension.     Palpations: Abdomen is soft.     Tenderness: There is no abdominal tenderness. There is no guarding or rebound.  Musculoskeletal:     Cervical back: Neck supple. No tenderness.     Right lower leg: No edema.     Left lower leg: No edema.  Skin:    General: Skin is dry.     Capillary Refill: Capillary refill takes less than 2 seconds.     Comments: Skin is cool  Neurological:     Comments: Mental status: Somnolent Speech: Slurred CN II: visual fields grossly intact CN III/IV/VI: Pinpoint pupils, EOMI grossly CN V: Untested CN VII: no facial droop CN VIII: no nystagmus, audition grossly intact VN IX/X: Tolerating secretions CN XI: trapezius and SCM motor function grossly intact CN XII: midline tongue w/o atrophy or fasciculation All 4 extremities with motor function grossly intact Coordination: Untested Gait: Untested     ED Results / Procedures / Treatments   Labs (all labs ordered are listed, but only abnormal results are displayed) Labs Reviewed  CBC WITH DIFFERENTIAL/PLATELET - Abnormal; Notable for the following components:      Result Value   WBC 15.6 (*)    Neutro Abs 10.3 (*)    Monocytes Absolute 1.1 (*)    Abs Immature Granulocytes 0.24 (*)    All other components within normal limits  COMPREHENSIVE METABOLIC PANEL - Abnormal; Notable for the following components:   Sodium 132 (*)    Chloride 95 (*)    Glucose, Bld 434 (*)    Creatinine, Ser 1.25 (*)    All other components within normal limits  LACTIC ACID, PLASMA - Abnormal; Notable for the following components:   Lactic Acid, Venous 3.1 (*)    All other components within normal limits  LACTIC ACID, PLASMA - Abnormal; Notable for the following components:   Lactic Acid, Venous 4.4 (*)    All other components within  normal limits  BLOOD GAS, VENOUS - Abnormal; Notable for the following components:   pH, Ven 7.209 (*)    pCO2, Ven 72.8 (*)    pO2, Ven 56.5 (*)    All other components within normal limits  CBC WITH DIFFERENTIAL/PLATELET - Abnormal; Notable for the following components:   WBC 25.8 (*)    Neutro Abs 23.6 (*)    Monocytes Absolute 1.6 (*)    Abs Immature Granulocytes 0.19 (*)    All other components within normal limits  HEMOGLOBIN A1C - Abnormal; Notable for the following components:   Hgb A1c MFr Bld 8.8 (*)    All  other components within normal limits  COMPREHENSIVE METABOLIC PANEL - Abnormal; Notable for the following components:   Chloride 96 (*)    Glucose, Bld 299 (*)    All other components within normal limits  PHOSPHORUS - Abnormal; Notable for the following components:   Phosphorus 6.1 (*)    All other components within normal limits  CBC - Abnormal; Notable for the following components:   WBC 22.8 (*)    All other components within normal limits  I-STAT VENOUS BLOOD GAS, ED - Abnormal; Notable for the following components:   pCO2, Ven 43.4 (*)    pO2, Ven 53.0 (*)    Sodium 134 (*)    Calcium, Ion 1.04 (*)    All other components within normal limits  CBG MONITORING, ED - Abnormal; Notable for the following components:   Glucose-Capillary 301 (*)    All other components within normal limits  RESP PANEL BY RT-PCR (FLU A&B, COVID) ARPGX2  CULTURE, BLOOD (ROUTINE X 2)  CULTURE, BLOOD (ROUTINE X 2)  MRSA NEXT GEN BY PCR, NASAL  MAGNESIUM  HIV ANTIBODY (ROUTINE TESTING W REFLEX)  RAPID URINE DRUG SCREEN, HOSP PERFORMED  BASIC METABOLIC PANEL    EKG EKG Interpretation  Date/Time:  Saturday December 02 2020 15:01:31 EDT Ventricular Rate:  93 PR Interval:  159 QRS Duration: 91 QT Interval:  348 QTC Calculation: 433 R Axis:   79 Text Interpretation: Sinus rhythm Nonspecific repol abnormality, inferior leads No significant change since last tracing Confirmed by  Melene Plan [phone]) on 12/02/2020 3:17:56 PM  Radiology CT HEAD WO CONTRAST  Result Date: 12/02/2020 CLINICAL DATA:  Delirium EXAM: CT HEAD WITHOUT CONTRAST TECHNIQUE: Contiguous axial images were obtained from the base of the skull through the vertex without intravenous contrast. COMPARISON:  None. FINDINGS: Brain: Normal anatomic configuration. No abnormal intra or extra-axial mass lesion or fluid collection. No abnormal mass effect or midline shift. No evidence of acute intracranial hemorrhage or infarct. Ventricular size is normal. Cerebellum unremarkable. Vascular: Unremarkable Skull: Intact Sinuses/Orbits: There is opacification of several left ethmoid air cells. Remaining paranasal sinuses are clear. No air-fluid levels. Orbits are unremarkable. Other: Mastoid air cells and middle ear cavities are clear. IMPRESSION: No acute intracranial abnormality. Mild left ethmoid sinus disease. Electronically Signed   By: Helyn Numbers MD   On: 12/02/2020 19:36   DG Chest Portable 1 View  Result Date: 12/02/2020 CLINICAL DATA:  Wheezing EXAM: PORTABLE CHEST 1 VIEW COMPARISON:  04/09/2020, 06/05/2015 FINDINGS: Slightly diminished lung volumes. Diffuse bilateral interstitial and streaky opacity. No pleural effusion, focal consolidation or pneumothorax. IMPRESSION: Low lung volumes. Mild diffuse interstitial and streaky lung opacity which could be secondary to central airways inflammatory process versus atypical/viral pneumonia. Electronically Signed   By: Jasmine Pang M.D.   On: 12/02/2020 15:31    Procedures Procedures   Medications Ordered in ED Medications  cloNIDine (CATAPRES) tablet 0.1 mg (has no administration in time range)  hydrOXYzine (ATARAX/VISTARIL) tablet 25 mg (25 mg Oral Given 12/02/20 2116)  insulin aspart (novoLOG) injection 0-15 Units (has no administration in time range)  LORazepam (ATIVAN) tablet 1-4 mg (has no administration in time range)    Or  LORazepam (ATIVAN) injection 1-4 mg  (has no administration in time range)  thiamine tablet 100 mg (has no administration in time range)    Or  thiamine (B-1) injection 100 mg (has no administration in time range)  folic acid (FOLVITE) tablet 1 mg (has no administration in time range)  multivitamin with minerals tablet 1 tablet (has no administration in time range)  0.9 %  sodium chloride infusion (has no administration in time range)  QUEtiapine (SEROQUEL) tablet 25 mg (has no administration in time range)  ondansetron (ZOFRAN) tablet 4 mg (has no administration in time range)    Or  ondansetron (ZOFRAN) injection 4 mg (has no administration in time range)  enoxaparin (LOVENOX) injection 40 mg (has no administration in time range)  ipratropium-albuterol (DUONEB) 0.5-2.5 (3) MG/3ML nebulizer solution 3 mL (3 mLs Nebulization Given 12/03/20 0123)  guaiFENesin (MUCINEX) 12 hr tablet 1,200 mg (1,200 mg Oral Not Given 12/02/20 2341)  naloxone [facility] injection 0.4 mg (has no administration in time range)  Ampicillin-Sulbactam (UNASYN) 3 g in sodium chloride 0.9 % 100 mL IVPB (3 g Intravenous New Bag/Given 12/02/20 2356)  acetaminophen (TYLENOL) suppository 650 mg (has no administration in time range)  lactated ringers bolus 1,000 mL (0 mLs Intravenous Stopped 12/02/20 1821)  LORazepam (ATIVAN) tablet 1 mg (1 mg Oral Given 12/02/20 2115)  OLANZapine (ZYPREXA) injection 10 mg (10 mg Intramuscular Given 12/02/20 1904)  OLANZapine (ZYPREXA) injection 10 mg (10 mg Intramuscular Given 12/02/20 2120)  metoprolol tartrate (LOPRESSOR) injection 5 mg (5 mg Intravenous Given 12/02/20 2356)    ED Course  I have reviewed the triage vital signs and the nursing notes.  Pertinent labs & imaging results that were available during my care of the patient were reviewed by me and considered in my medical decision making (see chart for details).    MDM Rules/Calculators/A&P                          This is a 35 year old male PMHx polysubstance use and  schizoaffective disorder, presenting for apparent intoxication after being found outside of a smoke shop with improved pupils.  Required Narcan x2 and bagging with EMS 20 minutes.  Here, persisting pinpoint pupils and somnolence, on 4 LPM O2 with sats in low to mid 90s, scattered wheezing/coarse breath sounds.  Initial interventions: Continue 4 LPM O2, LR bolus initiated.  DDx included: Opioid intoxication, coingestions, aspiration, pneumonia, obstructive pulmonary disease, psychosis, SI, HI  All studies independently reviewed by myself, d/w the attending physician, factored into my MDM. -EKG: NSR 93 bpm, normal axis, normal intervals, T wave inversions in inferior leads, no acute ST changes; TWI new compared to prior from 03/2020 -CXR: Diffuse interstitial and streaky lung opacities -CBC: WBC 15.6 -CMP: Na+ 132, glucose 434, AG 13, creatinine 1.25 (0.81) -Lactic acid: 3.1->4.4 -Unremarkable: CT head noncontrast, COVID/influenza PCR, VBG  Presentation appears most consistent with opioid toxicity with likely associated aspiration as evidenced on chest x-ray.  Wheezing less likely to be obstructive pulmonary disease given lack of history, and symptoms better explained by aspiration.  Patient does not appear acutely psychotic, although he has been intermittently threatening to staff as he is becoming more responsive.  Does not report SI or HI at this time.  Feel patient requires admission given his aspiration symptoms, concern for uncontrolled schizoaffective disorder, as well as continuing to recover from opioid ingestion.  Empiric antibiotics administered after blood cultures drawn.  Patient has additionally continued to intermittently become agitated, sometimes attempting to drink despite being counseled on n.p.o. status given concern for further aspiration with intermittent dry heaving.  Antiemetics administered with some relief.  Discussed with inpatient service who agreed to admit.  Plan discussed  with patient and mom understand and agree.  Patient HDS, nontoxic on  reevaluation, subsequently admitted.  Final Clinical Impression(s) / ED Diagnoses Final diagnoses:  Opiate overdose, undetermined intent, initial encounter [facility])  Drug overdose, undetermined intent, initial encounter    Rx / DC Orders ED Discharge Orders     None        Colvin Caroli, MD 12/03/20 0155    Melene Plan, DO 12/03/20 1514

## 2020-12-02 NOTE — ED Notes (Signed)
Report given to rn on 2c 

## 2020-12-02 NOTE — ED Notes (Addendum)
Patient placed on nasal cannula after O2 fell to 42% during sleeping apnea episode. Is now averaging at 87% on 5L O2. Patient is tachy at 117bpm. Dr. Truitt Merle

## 2020-12-02 NOTE — ED Notes (Signed)
Pt goes to sleep and his satzs drop

## 2020-12-02 NOTE — ED Notes (Signed)
Pt continues to take off all cords and O2.

## 2020-12-02 NOTE — Progress Notes (Signed)
Pharmacy Antibiotic Note  [name] is a 35 y.o. male admitted on 12/02/2020 with  aspiration pneumonia .  Pharmacy has been consulted for unasyn dosing.  Plan: Unasyn 3g q6h Follow up renal function and cultures     Temp (24hrs), Avg:98.5 F (36.9 C), Min:98.5 F (36.9 C), Max:98.5 F (36.9 C)  Recent Labs  Lab 12/02/20 1523 12/02/20 1705 12/02/20 2030  WBC 15.6*  --  22.8*  CREATININE 1.25*  --  1.19  LATICACIDVEN  --  3.1* 4.4*    CrCl cannot be calculated (Unknown ideal weight.).    No Known Allergies  Antimicrobials this admission: Unasyn 7/16 >>   Microbiology results: Pending  Thank you for allowing pharmacy to be a part of this patient's care.  Cathie Hoops 12/02/2020 10:43 PM

## 2020-12-02 NOTE — H&P (Addendum)
History and Physical    [name] SJG:[id number] DOB: Apr 05, 1986 DOA: 12/02/2020  PCP: Pcp, No (Confirm with patient/family/NH records and if not entered, this has to be entered at [facility] point of entry) Patient coming from: Home  I have personally briefly reviewed patient's old medical records in [facility]  Chief Complaint: Feeling better  HPI: Ho [name] is a 35 y.o. male with medical history significant of polysubstance abuse, schizoaffective disorder, anxiety/depression, presented with altered mentations.  Patient was found facing down in the smoke shop unresponsive.  EMS called, EMS arrived found patient pinpoint pupils and agonal breathing and O2 saturation 45%.  Patient was started on nonrebreather, O2 saturation started to improve after about 20 minutes.  During which time, 2 doses of Narcan given.  Patient admitted at he was snoring heroine this afternoon and soon felt " about to get heatstroke" then does not remember that had happened since.  Mother at bedside reporting patient has had poorly controlled schizophrenia recently, with noncompliant with his psych medications, consulted mother 2 times recently.  And several times claimed that he wanted to end his life.  ED Course: O2 saturation 91% on 4 L then improved to 95% on 4 L.  Shirts soaked with vomitus. Blood pressure low but responded to IV fluids.  Chest x-ray showed diffused multifocal pneumonia compatible with aspiration.  Patient was somnolent at the beginning but more awake with episodic agitation.  Review of Systems: Unable to perform this time, patient still confused.  Past Medical History:  Diagnosis Date   Anxiety    Depression    Hypertension    Schizoaffective disorder (HCC)    Schizophrenia (HCC)     Past Surgical History:  Procedure Laterality Date   BACK SURGERY       reports that he has an unknown smoking status. He uses smokeless tobacco. He reports that he does not drink alcohol and  does not use drugs.  No Known Allergies  Family History  Problem Relation Age of Onset   Allergies Mother    Asthma Mother      Prior to Admission medications   Medication Sig Start Date End Date Taking? Authorizing Provider  fluPHENAZine decanoate (PROLIXIN) 25 MG/ML injection Inject 2 mLs (50 mg total) into the muscle every 14 (fourteen) days. 10/02/18  Yes Malvin Johns, MD  methocarbamol (ROBAXIN) 500 MG tablet Take 1 tablet (500 mg total) by mouth every 8 (eight) hours as needed for muscle spasms. Patient not taking: Reported on 12/02/2020 07/10/19   Chales Abrahams, NP  naproxen (NAPROSYN) 500 MG tablet Take 1 tablet (500 mg total) by mouth 2 (two) times daily as needed (aching, pain, or discomfort). Patient not taking: Reported on 12/02/2020 07/10/19   Chales Abrahams, NP  ondansetron (ZOFRAN-ODT) 4 MG disintegrating tablet Take 1 tablet (4 mg total) by mouth every 6 (six) hours as needed for nausea or vomiting. Patient not taking: Reported on 12/02/2020 07/10/19   Chales Abrahams, NP  SEROQUEL 25 MG tablet Take 25 mg by mouth 2 (two) times daily. 11/06/20   [provider]    Physical Exam: Vitals:   12/02/20 1530 12/02/20 1620 12/02/20 1702 12/02/20 1741  BP: (!) 110/97 118/67 134/79 (!) 134/99  Pulse: 87 86 90 87  Resp: 18 18 18 18   Temp:      TempSrc:      SpO2: 93% 92% 92% 92%    Constitutional: NAD, calm, comfortable Vitals:   12/02/20 1530 12/02/20 1620  12/02/20 1702 12/02/20 1741  BP: (!) 110/97 118/67 134/79 (!) 134/99  Pulse: 87 86 90 87  Resp: 18 18 18 18   Temp:      TempSrc:      SpO2: 93% 92% 92% 92%   Eyes: Bilateral pupils about 2 mm and very sluggish to light reflex, lids and conjunctivae normal ENMT: Mucous membranes are moist. Posterior pharynx clear of any exudate or lesions.Normal dentition.  Neck: normal, supple, no masses, no thyromegaly Respiratory: clear to auscultation bilaterally, diffused wheezing and crackles.  Increasing respiratory  effort. No accessory muscle use.  Cardiovascular: Regular rate and rhythm, no murmurs / rubs / gallops. No extremity edema. 2+ pedal pulses. No carotid bruits.  Abdomen: no tenderness, no masses palpated. No hepatosplenomegaly. Bowel sounds positive.  Musculoskeletal: no clubbing / cyanosis. No joint deformity upper and lower extremities. Good ROM, no contractures. Normal muscle tone.  Skin: no rashes, lesions, ulcers. No induration Neurologic: No facial droops, moving all limbs, following simple commands. Psychiatric: Oriented to himself, confused about time and place, episodic agitation   Labs on Admission: I have personally reviewed following labs and imaging studies  CBC: Recent Labs  Lab 12/02/20 1523  WBC 15.6*  NEUTROABS 10.3*  HGB 16.2  HCT 46.1  MCV 85.2  PLT 366   Basic Metabolic Panel: Recent Labs  Lab 12/02/20 1523  NA 132*  K 4.2  CL 95*  CO2 24  GLUCOSE 434*  BUN 9  CREATININE 1.25*  CALCIUM 9.1   GFR: CrCl cannot be calculated (Unknown ideal weight.). Liver Function Tests: Recent Labs  Lab 12/02/20 1523  AST 28  ALT 33  ALKPHOS 86  BILITOT 0.4  PROT 7.0  ALBUMIN 3.6   No results for input(s): LIPASE, AMYLASE in the last 168 hours. No results for input(s): AMMONIA in the last 168 hours. Coagulation Profile: No results for input(s): INR, PROTIME in the last 168 hours. Cardiac Enzymes: No results for input(s): CKTOTAL, CKMB, CKMBINDEX, TROPONINI in the last 168 hours. BNP (last 3 results) No results for input(s): PROBNP in the last 8760 hours. HbA1C: No results for input(s): HGBA1C in the last 72 hours. CBG: No results for input(s): GLUCAP in the last 168 hours. Lipid Profile: No results for input(s): CHOL, HDL, LDLCALC, TRIG, CHOLHDL, LDLDIRECT in the last 72 hours. Thyroid Function Tests: No results for input(s): TSH, T4TOTAL, FREET4, T3FREE, THYROIDAB in the last 72 hours. Anemia Panel: No results for input(s): VITAMINB12, FOLATE,  FERRITIN, TIBC, IRON, RETICCTPCT in the last 72 hours. Urine analysis:    Component Value Date/Time   COLORURINE YELLOW 06/25/2011 2044   APPEARANCEUR CLEAR 06/25/2011 2044   LABSPEC 1.027 06/25/2011 2044   PHURINE 6.0 06/25/2011 2044   GLUCOSEU NEGATIVE 06/25/2011 2044   HGBUR NEGATIVE 06/25/2011 2044   BILIRUBINUR NEGATIVE 06/25/2011 2044   KETONESUR NEGATIVE 06/25/2011 2044   PROTEINUR NEGATIVE 06/25/2011 2044   UROBILINOGEN 0.2 06/25/2011 2044   NITRITE NEGATIVE 06/25/2011 2044   LEUKOCYTESUR NEGATIVE 06/25/2011 2044    Radiological Exams on Admission: DG Chest Portable 1 View  Result Date: 12/02/2020 CLINICAL DATA:  Wheezing EXAM: PORTABLE CHEST 1 VIEW COMPARISON:  04/09/2020, 06/05/2015 FINDINGS: Slightly diminished lung volumes. Diffuse bilateral interstitial and streaky opacity. No pleural effusion, focal consolidation or pneumothorax. IMPRESSION: Low lung volumes. Mild diffuse interstitial and streaky lung opacity which could be secondary to central airways inflammatory process versus atypical/viral pneumonia. Electronically Signed   By: 06/07/2015 M.D.   On: 12/02/2020 15:31  EKG: Independently reviewed.  Sinus, T inversion in III, aVF, no significant changes compared to previous EKG.  Assessment/Plan Active Problems:   Aspiration pneumonia (HCC)   Hypoxia  (please populate well all problems here in Problem List. (For example, if patient is on BP meds at home and you resume or decide to hold them, it is a problem that needs to be her. Same for CAD, COPD, HLD and so on)  Acute hypoxic respite failure -Secondary to aspiration pneumonia, bilateral diffusely. His clothes all tainted with vomitus on arrival. -NPO until mentation close to baseline, and recovered protection. -Continue Unasyn, repeat chest x-ray tomorrow. -IV fluid. -HOB 30 degrees and aspiration precautions.  Acute toxic encephalopathy -Seems to improving compared to ER documentation  earlier. -Secondary to cocaine abuse and acute hypoxic -Probably had a fall also, check CT head without contrast -CIWA protocol  Heroin overdose -Status post Narcan x2 in the field -Signs of improving mentation, but plans to keep him NPO for tonight -PRN Clonidine and Hydroxyzine. -PRN Narcan if recurrent signs of breathing compromised  Sepsis -Evidenced by acute hypoxia, leukocytosis, source is aspiration pneumonia.  Treatment as above.  Elevated glucose -Might be from glucose containing meds/Narcan and fluid administered in the field, he has no Hx of DM. -Check A1C and temporary sliding scale.  Hyponatremia -Dilutional vs 2/2 elevated glucose -Repeat BMP in AM.  Schizoaffective disorder -Continue Seroquel -As needed benzos combined with CIWA protocol -Consult psychiatry for help  DVT prophylaxis: Lovenox Code Status: Full Code Family Communication: Mother at bedside Disposition Plan: Expect more than 2 midnight hospital stay Consults called: None Admission status: PCU   Emeline General MD [facility] Pager [phone]  12/02/2020, 6:25 PM

## 2020-12-02 NOTE — ED Triage Notes (Signed)
Pt BIB [facility] from a smoke shop found down. Pt had pin point pupils, agonal breathing, EMS assisted with breaths for about 20 mins. 2 of Narcan given with EMS. Pt's O2 was 45% on EMS arrival. Pt denies any drug use.

## 2020-12-02 NOTE — ED Notes (Signed)
On call doctor called ?? Dr Mayford Knife who knew nothing about this pt is on call notified of the impossibility of getting this pts ivs and iv meds given  More med ordered

## 2020-12-02 NOTE — ED Notes (Signed)
No iv yet

## 2020-12-02 NOTE — ED Notes (Signed)
The pt has removed his 2nd iv removed his monitor up walking around in the room bleeding from his iv removal

## 2020-12-02 NOTE — ED Notes (Signed)
The pt has been out of bed   And has pulled off all his leads nasal 02 and bp cuff

## 2020-12-02 NOTE — ED Notes (Signed)
Patient is now asleep.

## 2020-12-02 NOTE — ED Notes (Signed)
The pt has pulled his only iv out  He keeps pulling everything off  Admitting doctor aware    Iv team came to start another iv but the pt is too uncooperative

## 2020-12-02 NOTE — ED Notes (Signed)
Patient removed vital monitoring devices and is now pacing around room searching for shoes

## 2020-12-02 NOTE — Progress Notes (Signed)
Patient became agitated and pulled out IV lines and monitoring x2.  Ordered one dose of Zyprexa.  1:1 for close monitoring.

## 2020-12-02 NOTE — ED Notes (Signed)
The pt  Has been asleep since he rfeceived the medicine  im  They were able to c-t the pt we have hooked him back to monitor nasal 02 bp etc

## 2020-12-02 NOTE — ED Notes (Signed)
The pts mother is at  The bedside.   She is out to the desk every few minutes  Asking for water for her son and asking to speak to the doctor. Message given tp the doctor.  The pt keeps taking his 02 off his pulse o also  And I have asked the mother if she will hold his rt hand so his iv fluid can infuse

## 2020-12-02 NOTE — ED Notes (Signed)
The pt will not hold his rt arm straight because the iv is there he keeps it bent and the iv is not running.  He keeps asking for soda then falls back to sleep

## 2020-12-02 NOTE — ED Notes (Signed)
Admitting at the bedside.  

## 2020-12-03 ENCOUNTER — Inpatient Hospital Stay (HOSPITAL_COMMUNITY): Payer: Medicare Other

## 2020-12-03 DIAGNOSIS — R0902 Hypoxemia: Secondary | ICD-10-CM | POA: Diagnosis not present

## 2020-12-03 DIAGNOSIS — J690 Pneumonitis due to inhalation of food and vomit: Secondary | ICD-10-CM | POA: Diagnosis not present

## 2020-12-03 LAB — CBC WITH DIFFERENTIAL/PLATELET
Abs Immature Granulocytes: 0.19 10*3/uL — ABNORMAL HIGH (ref 0.00–0.07)
Basophils Absolute: 0 10*3/uL (ref 0.0–0.1)
Basophils Relative: 0 %
Eosinophils Absolute: 0 10*3/uL (ref 0.0–0.5)
Eosinophils Relative: 0 %
HCT: 46.5 % (ref 39.0–52.0)
Hemoglobin: 16.1 g/dL (ref 13.0–17.0)
Immature Granulocytes: 1 %
Lymphocytes Relative: 5 %
Lymphs Abs: 1.2 10*3/uL (ref 0.7–4.0)
MCH: 29.8 pg (ref 26.0–34.0)
MCHC: 34.6 g/dL (ref 30.0–36.0)
MCV: 86 fL (ref 80.0–100.0)
Monocytes Absolute: 1.6 10*3/uL — ABNORMAL HIGH (ref 0.1–1.0)
Monocytes Relative: 6 %
Neutro Abs: 23.6 10*3/uL — ABNORMAL HIGH (ref 1.7–7.7)
Neutrophils Relative %: 88 %
Platelets: 338 10*3/uL (ref 150–400)
RBC: 5.41 MIL/uL (ref 4.22–5.81)
RDW: 11.8 % (ref 11.5–15.5)
WBC: 25.8 10*3/uL — ABNORMAL HIGH (ref 4.0–10.5)
nRBC: 0 % (ref 0.0–0.2)

## 2020-12-03 LAB — BASIC METABOLIC PANEL
Anion gap: 7 (ref 5–15)
BUN: 15 mg/dL (ref 6–20)
CO2: 25 mmol/L (ref 22–32)
Calcium: 8.4 mg/dL — ABNORMAL LOW (ref 8.9–10.3)
Chloride: 99 mmol/L (ref 98–111)
Creatinine, Ser: 1.22 mg/dL (ref 0.61–1.24)
GFR, Estimated: >60 mL/min (ref >60)
Glucose, Bld: 280 mg/dL — ABNORMAL HIGH (ref 70–99)
Potassium: 5.8 mmol/L — ABNORMAL HIGH (ref 3.5–5.1)
Sodium: 131 mmol/L — ABNORMAL LOW (ref 135–145)

## 2020-12-03 LAB — BLOOD GAS, VENOUS
Acid-Base Excess: 0.8 mmol/L (ref 0.0–2.0)
Bicarbonate: 28 mmol/L (ref 20.0–28.0)
Drawn by: 1444
O2 Saturation: 84.6 %
Patient temperature: 37
pCO2, Ven: 72.8 mmHg (ref 44.0–60.0)
pH, Ven: 7.209 — ABNORMAL LOW (ref 7.250–7.430)
pO2, Ven: 56.5 mmHg — ABNORMAL HIGH (ref 32.0–45.0)

## 2020-12-03 LAB — GLUCOSE, CAPILLARY
Glucose-Capillary: 208 mg/dL — ABNORMAL HIGH (ref 70–99)
Glucose-Capillary: 216 mg/dL — ABNORMAL HIGH (ref 70–99)
Glucose-Capillary: 223 mg/dL — ABNORMAL HIGH (ref 70–99)
Glucose-Capillary: 206 mg/dL — ABNORMAL HIGH (ref 70–99)

## 2020-12-03 LAB — MRSA NEXT GEN BY PCR, NASAL: MRSA by PCR Next Gen: NOT DETECTED

## 2020-12-03 MED ORDER — FUROSEMIDE 10 MG/ML IJ SOLN
20.0000 mg | Freq: Once | INTRAMUSCULAR | Status: AC
  Administered 2020-12-03: 20 mg via INTRAVENOUS
  Filled 2020-12-03: qty 2

## 2020-12-03 MED ORDER — LACTATED RINGERS IV SOLN: INTRAVENOUS | Status: DC

## 2020-12-03 MED ORDER — SODIUM POLYSTYRENE SULFONATE 15 GM/60ML PO SUSP
15.0000 g | Freq: Once | ORAL | Status: DC
  Filled 2020-12-03: qty 60

## 2020-12-03 NOTE — Plan of Care (Signed)
  Problem: Activity: Goal: Risk for activity intolerance will decrease Outcome: Progressing   Problem: Nutrition: Goal: Adequate nutrition will be maintained Outcome: Progressing   Problem: Coping: Goal: Level of anxiety will decrease Outcome: Progressing   Problem: Elimination: Goal: Will not experience complications related to bowel motility Outcome: Progressing   Problem: Safety: Goal: Ability to remain free from injury will improve Outcome: Progressing   

## 2020-12-03 NOTE — Progress Notes (Signed)
PROGRESS NOTE  [name] [id number]:[id number] DOB: August 09, 1985 DOA: 12/02/2020 PCP: Pcp, No  HPI/Recap of past 24 hours:  FROM ADMISSION HPI: [name] is a 35 y.o. male with medical history significant of polysubstance abuse, schizoaffective disorder, anxiety/depression, presented with altered mentations.   Patient was found facing down in the smoke shop unresponsive.  EMS called, EMS arrived found patient pinpoint pupils and agonal breathing and O2 saturation 45%.  Patient was started on nonrebreather, O2 saturation started to improve after about 20 minutes.  During which time, 2 doses of Narcan given.   Patient admitted at he was snoring heroine this afternoon and soon felt " about to get heatstroke" then does not remember that had happened since.   Mother at bedside reporting patient has had poorly controlled schizophrenia recently, with noncompliant with his psych medications, consulted mother 2 times recently.  And several times claimed that he wanted to end his life.  Seen and examined at bedside: Patient is on BiPAP and is sleeping originally earlier he was agitated and trying to pull out his IV line and oxygen   Assessment/Plan: Active Problems:   Aspiration pneumonia (HCC)   Hypoxia #1 aspiration pneumonia Continue IV Zosyn Leukocytosis due to pneumonia continue to monitor  2.  Acute heroin overdose unintentional  3.  Acute toxic encephalopathy due to overdose  4.  Sepsis improving patient had acute hypoxia with leukocytosis and aspiration pneumonia.  Continue treatment IV fluid and antibiotics  5.  Schizophrenia.  Poorly controlled.  Patient is stated to be noncompliant with his medicine Psychiatry has been consulted he will be continued on his home medicine  6.  Hyponatremia we will replace with IV fluid  7.  Mild hypokalemia  Kayexalate was ordered but patient was lethargic and unable to take p.o.  I have ordered IV Lasix and will recheck in the morning  8.   Hyperglycemia may be due to IV drips we will continue to monitor and use sliding scale  Code Status: Full  Severity of Illness: The appropriate patient status for this patient is INPATIENT. Inpatient status is judged to be reasonable and necessary in order to provide the required intensity of service to ensure the patient's safety. The patient's presenting symptoms, physical exam findings, and initial radiographic and laboratory data in the context of their chronic comorbidities is felt to place them at high risk for further clinical deterioration. Furthermore, it is not anticipated that the patient will be medically stable for discharge from the hospital within 2 midnights of admission. The following factors support the patient status of inpatient.  Respiratory failure requiring BiPAP  * I certify that at the point of admission it is my clinical judgment that the patient will require inpatient hospital care spanning beyond 2 midnights from the point of admission due to high intensity of service, high risk for further deterioration and high frequency of surveillance required.*   Family Communication: None at bedside  Disposition Plan: Remains in progressive unit   Consultants: Psychiatry  Procedures: BiPAP  Antimicrobials: Zosyn   DVT prophylaxis: Lovenox   Objective: Vitals:   12/03/20 0349 12/03/20 0729 12/03/20 0731 12/03/20 0741  BP:    127/67  Pulse:   (!) 106 (!) 103  Resp:   19 15  Temp: 98.7 F (37.1 C)   98.7 F (37.1 C)  TempSrc: Axillary   Axillary  SpO2:  97% 98% 92%   No intake or output data in the 24 hours ending 12/03/20 0817 There were  no vitals filed for this visit. There is no height or weight on file to calculate BMI.  Exam:  General: 35 y.o. year-old male well developed well nourished in no acute distress.  Sleepy drowsy BiPAP in use Cardiovascular: Regular rate and rhythm with no rubs or gallops.  No thyromegaly or JVD noted.   Respiratory: Clear  to auscultation with no wheezes or rales. Good inspiratory effort. Abdomen: Soft nontender nondistended with normal bowel sounds x4 quadrants. Musculoskeletal: No lower extremity edema. 2/4 pulses in all 4 extremities. Skin: No ulcerative lesions noted or rashes, Psychiatry: Mood is appropriate for condition and setting    Data Reviewed: CBC: Recent Labs  Lab 12/02/20 1523 12/02/20 1833 12/02/20 2030 12/03/20 0030  WBC 15.6*  --  22.8* 25.8*  NEUTROABS 10.3*  --   --  23.6*  HGB 16.2 16.7 16.5 16.1  HCT 46.1 49.0 48.8 46.5  MCV 85.2  --  88.4 86.0  PLT 366  --  305 338   Basic Metabolic Panel: Recent Labs  Lab 12/02/20 1523 12/02/20 1833 12/02/20 2030 12/03/20 0418  NA 132* 134* 135 131*  K 4.2 4.3 4.4 5.8*  CL 95*  --  96* 99  CO2 24  --  28 25  GLUCOSE 434*  --  299* 280*  BUN 9  --  9 15  CREATININE 1.25*  --  1.19 1.22  CALCIUM 9.1  --  9.1 8.4*  MG  --   --  1.9  --   PHOS  --   --  6.1*  --    GFR: CrCl cannot be calculated (Unknown ideal weight.). Liver Function Tests: Recent Labs  Lab 12/02/20 1523 12/02/20 2030  AST 28 28  ALT 33 35  ALKPHOS 86 90  BILITOT 0.4 0.6  PROT 7.0 7.7  ALBUMIN 3.6 4.1   No results for input(s): LIPASE, AMYLASE in the last 168 hours. No results for input(s): AMMONIA in the last 168 hours. Coagulation Profile: No results for input(s): INR, PROTIME in the last 168 hours. Cardiac Enzymes: No results for input(s): CKTOTAL, CKMB, CKMBINDEX, TROPONINI in the last 168 hours. BNP (last 3 results) No results for input(s): PROBNP in the last 8760 hours. HbA1C: Recent Labs    12/02/20 2030  HGBA1C 8.8*   CBG: Recent Labs  Lab 12/02/20 2034 12/03/20 0632  GLUCAP 301* 208*   Lipid Profile: No results for input(s): CHOL, HDL, LDLCALC, TRIG, CHOLHDL, LDLDIRECT in the last 72 hours. Thyroid Function Tests: No results for input(s): TSH, T4TOTAL, FREET4, T3FREE, THYROIDAB in the last 72 hours. Anemia Panel: No results  for input(s): VITAMINB12, FOLATE, FERRITIN, TIBC, IRON, RETICCTPCT in the last 72 hours. Urine analysis:    Component Value Date/Time   COLORURINE YELLOW 06/25/2011 2044   APPEARANCEUR CLEAR 06/25/2011 2044   LABSPEC 1.027 06/25/2011 2044   PHURINE 6.0 06/25/2011 2044   GLUCOSEU NEGATIVE 06/25/2011 2044   HGBUR NEGATIVE 06/25/2011 2044   BILIRUBINUR NEGATIVE 06/25/2011 2044   KETONESUR NEGATIVE 06/25/2011 2044   PROTEINUR NEGATIVE 06/25/2011 2044   UROBILINOGEN 0.2 06/25/2011 2044   NITRITE NEGATIVE 06/25/2011 2044   LEUKOCYTESUR NEGATIVE 06/25/2011 2044   Sepsis Labs: @LABRCNTIP (procalcitonin:4,lacticidven:4)  ) Recent Results (from the past 240 hour(s))  Resp Panel by RT-PCR (Flu A&B, Covid) Nasopharyngeal Swab     Status: None   Collection Time: 12/02/20  3:43 PM   Specimen: Nasopharyngeal Swab; Nasopharyngeal(NP) swabs in vial transport medium  Result Value Ref Range Status   SARS Coronavirus  2 by RT PCR NEGATIVE NEGATIVE Final    Comment: (NOTE) SARS-CoV-2 target nucleic acids are NOT DETECTED.  The SARS-CoV-2 RNA is generally detectable in upper respiratory specimens during the acute phase of infection. The lowest concentration of SARS-CoV-2 viral copies this assay can detect is 138 copies/mL. A negative result does not preclude SARS-Cov-2 infection and should not be used as the sole basis for treatment or other patient management decisions. A negative result may occur with  improper specimen collection/handling, submission of specimen other than nasopharyngeal swab, presence of viral mutation(s) within the areas targeted by this assay, and inadequate number of viral copies(<138 copies/mL). A negative result must be combined with clinical observations, patient history, and epidemiological information. The expected result is Negative.  Fact Sheet for Patients:  BloggerCourse.com  Fact Sheet for Healthcare Providers:   SeriousBroker.it  This test is no t yet approved or cleared by the [place] FDA and  has been authorized for detection and/or diagnosis of SARS-CoV-2 by FDA under an Emergency Use Authorization (EUA). This EUA will remain  in effect (meaning this test can be used) for the duration of the COVID-19 declaration under Section 564(b)(1) of the Act, 21 U.S.C.section 360bbb-3(b)(1), unless the authorization is terminated  or revoked sooner.       Influenza A by PCR NEGATIVE NEGATIVE Final   Influenza B by PCR NEGATIVE NEGATIVE Final    Comment: (NOTE) The Xpert Xpress SARS-CoV-2/FLU/RSV plus assay is intended as an aid in the diagnosis of influenza from Nasopharyngeal swab specimens and should not be used as a sole basis for treatment. Nasal washings and aspirates are unacceptable for Xpert Xpress SARS-CoV-2/FLU/RSV testing.  Fact Sheet for Patients: BloggerCourse.com  Fact Sheet for Healthcare Providers: SeriousBroker.it  This test is not yet approved or cleared by the [place] FDA and has been authorized for detection and/or diagnosis of SARS-CoV-2 by FDA under an Emergency Use Authorization (EUA). This EUA will remain in effect (meaning this test can be used) for the duration of the COVID-19 declaration under Section 564(b)(1) of the Act, 21 U.S.C. section 360bbb-3(b)(1), unless the authorization is terminated or revoked.  Performed at [facility], [address], [place], [place] [address]   MRSA Next Gen by PCR, Nasal     Status: None   Collection Time: 12/02/20 11:37 PM   Specimen: Nasal Mucosa; Nasal Swab  Result Value Ref Range Status   MRSA by PCR Next Gen NOT DETECTED NOT DETECTED Final    Comment: (NOTE) The GeneXpert MRSA Assay (FDA approved for NASAL specimens only), is one component of a comprehensive MRSA colonization surveillance program. It is not intended to diagnose  MRSA infection nor to guide or monitor treatment for MRSA infections. Test performance is not FDA approved in patients less than 48 years old. Performed at [facility], [address] [place], [place] [address]       Studies: CT HEAD WO CONTRAST  Result Date: 12/02/2020 CLINICAL DATA:  Delirium EXAM: CT HEAD WITHOUT CONTRAST TECHNIQUE: Contiguous axial images were obtained from the base of the skull through the vertex without intravenous contrast. COMPARISON:  None. FINDINGS: Brain: Normal anatomic configuration. No abnormal intra or extra-axial mass lesion or fluid collection. No abnormal mass effect or midline shift. No evidence of acute intracranial hemorrhage or infarct. Ventricular size is normal. Cerebellum unremarkable. Vascular: Unremarkable Skull: Intact Sinuses/Orbits: There is opacification of several left ethmoid air cells. Remaining paranasal sinuses are clear. No air-fluid levels. Orbits are  unremarkable. Other: Mastoid air cells and middle ear cavities are clear. IMPRESSION: No acute intracranial abnormality. Mild left ethmoid sinus disease. Electronically Signed   By: Helyn NumbersAshesh  Parikh MD   On: 12/02/2020 19:36   DG Chest Portable 1 View  Result Date: 12/02/2020 CLINICAL DATA:  Wheezing EXAM: PORTABLE CHEST 1 VIEW COMPARISON:  04/09/2020, 06/05/2015 FINDINGS: Slightly diminished lung volumes. Diffuse bilateral interstitial and streaky opacity. No pleural effusion, focal consolidation or pneumothorax. IMPRESSION: Low lung volumes. Mild diffuse interstitial and streaky lung opacity which could be secondary to central airways inflammatory process versus atypical/viral pneumonia. Electronically Signed   By: Jasmine PangKim  Fujinaga M.D.   On: 12/02/2020 15:31    Scheduled Meds:  enoxaparin (LOVENOX) injection  40 mg Subcutaneous Q24H   folic acid  1 mg Oral Daily   guaiFENesin  1,200 mg Oral BID   insulin aspart  0-15 Units Subcutaneous TID WC   ipratropium-albuterol  3 mL Nebulization  Q6H   multivitamin with minerals  1 tablet Oral Daily   QUEtiapine  25 mg Oral BID   sodium polystyrene  15 g Oral Once   thiamine  100 mg Oral Daily   Or   thiamine  100 mg Intravenous Daily    Continuous Infusions:  sodium chloride     ampicillin-sulbactam (UNASYN) IV 3 g (12/03/20 0434)     LOS: 1 day     Myrtie NeitherNwannadiya Del Overfelt, MD [facility]  To reach me or the doctor on call, go to: [email or web address] Password [email or web address]  12/03/2020, 8:17 AM

## 2020-12-03 NOTE — Consult Note (Signed)
  Patient seen face to face but unable to participate in psychiatric assessment due to being lethargic from sedative. Re-consult psychiatric service when patient is able to participate in assessment.  Thedore Mins, MD Attending psychiatrist

## 2020-12-03 NOTE — Plan of Care (Signed)
  Problem: Education: Goal: Knowledge of General Education information will improve Description: Including pain rating scale, medication(s)/side effects and non-pharmacologic comfort measures Outcome: Not Progressing   Patient not coherent for education at this time

## 2020-12-03 NOTE — Progress Notes (Signed)
   12/02/20 2330  Assess: MEWS Score  Temp (!) 100.5 F (38.1 C)  BP (!) 168/115  Pulse Rate (!) 118  ECG Heart Rate (!) 118  Resp 12  SpO2 94 %  O2 Device Nasal Cannula  O2 Flow Rate (L/min) 5 L/min  Assess: MEWS Score  MEWS Temp 1  MEWS Systolic 0  MEWS Pulse 2  MEWS RR 1  MEWS LOC 0  MEWS Score 4  MEWS Score Color Red  Assess: if the MEWS score is Yellow or Red  Were vital signs taken at a resting state? Yes  Focused Assessment No change from prior assessment  Early Detection of Sepsis Score *See Row Information* Medium  Treat  MEWS Interventions Administered prn meds/treatments  Pain Scale Faces  Pain Score Asleep  Take Vital Signs  Increase Vital Sign Frequency  Red: Q 1hr X 4 then Q 4hr X 4, if remains red, continue Q 4hrs  Escalate  MEWS: Escalate Red: discuss with charge nurse/RN and provider, consider discussing with RRT  Notify: Charge Nurse/RN  Name of Charge Nurse/RN Notified April Cooper  Date Charge Nurse/RN Notified 12/02/20  Time Charge Nurse/RN Notified [id number]

## 2020-12-04 DIAGNOSIS — T40601A Poisoning by unspecified narcotics, accidental (unintentional), initial encounter: Secondary | ICD-10-CM

## 2020-12-04 DIAGNOSIS — A419 Sepsis, unspecified organism: Secondary | ICD-10-CM | POA: Diagnosis not present

## 2020-12-04 DIAGNOSIS — R6520 Severe sepsis without septic shock: Secondary | ICD-10-CM | POA: Diagnosis not present

## 2020-12-04 DIAGNOSIS — T401X4A Poisoning by heroin, undetermined, initial encounter: Secondary | ICD-10-CM

## 2020-12-04 DIAGNOSIS — J690 Pneumonitis due to inhalation of food and vomit: Secondary | ICD-10-CM | POA: Diagnosis not present

## 2020-12-04 DIAGNOSIS — J9601 Acute respiratory failure with hypoxia: Secondary | ICD-10-CM

## 2020-12-04 LAB — BASIC METABOLIC PANEL
Anion gap: 8 (ref 5–15)
BUN: 10 mg/dL (ref 6–20)
CO2: 31 mmol/L (ref 22–32)
Calcium: 8.8 mg/dL — ABNORMAL LOW (ref 8.9–10.3)
Chloride: 98 mmol/L (ref 98–111)
Creatinine, Ser: 0.97 mg/dL (ref 0.61–1.24)
GFR, Estimated: >60 mL/min (ref >60)
Glucose, Bld: 156 mg/dL — ABNORMAL HIGH (ref 70–99)
Potassium: 4.1 mmol/L (ref 3.5–5.1)
Sodium: 137 mmol/L (ref 135–145)

## 2020-12-04 LAB — GLUCOSE, CAPILLARY
Glucose-Capillary: 189 mg/dL — ABNORMAL HIGH (ref 70–99)
Glucose-Capillary: 186 mg/dL — ABNORMAL HIGH (ref 70–99)
Glucose-Capillary: 175 mg/dL — ABNORMAL HIGH (ref 70–99)
Glucose-Capillary: 145 mg/dL — ABNORMAL HIGH (ref 70–99)

## 2020-12-04 LAB — CBC
HCT: 40.3 % (ref 39.0–52.0)
Hemoglobin: 13.4 g/dL (ref 13.0–17.0)
MCH: 29.6 pg (ref 26.0–34.0)
MCHC: 33.3 g/dL (ref 30.0–36.0)
MCV: 89 fL (ref 80.0–100.0)
Platelets: 223 10*3/uL (ref 150–400)
RBC: 4.53 MIL/uL (ref 4.22–5.81)
RDW: 11.7 % (ref 11.5–15.5)
WBC: 11 10*3/uL — ABNORMAL HIGH (ref 4.0–10.5)
nRBC: 0 % (ref 0.0–0.2)

## 2020-12-04 MED ORDER — IPRATROPIUM-ALBUTEROL 0.5-2.5 (3) MG/3ML IN SOLN: 3.0000 mL | Freq: Four times a day (QID) | RESPIRATORY_TRACT | Status: DC | PRN

## 2020-12-04 NOTE — Progress Notes (Signed)
Pt slept for the night, awakened at times for a short while ,bipap maintained , spo2 decrease to 88% when off for a while.

## 2020-12-04 NOTE — Consult Note (Signed)
Patient seen at bedside but unable to participate in psychiatric assessment due to lethargy. Will re-assess tomorrow.  Park Pope, MD PGY1 Psychiatry Resident

## 2020-12-04 NOTE — Progress Notes (Signed)
PROGRESS NOTE    [name]  GQQ:[id number] DOB: March 15, 1986 DOA: 12/02/2020 PCP: Pcp, No   Brief Narrative:  The patient is an obese 35 year old male with a past medical history significant for but not limited to polysubstance abuse, schizoaffective disorder, anxiety and depression as well as other comorbidities who presented with altered mental status.  Patient was found facing down the smoke shop unresponsive.  EMS was called and patient was found to have pinpoint pupils and agonal breathing and an O2 saturation of 45%.  He was placed on a nonrebreather and O2 saturations improved after about 20 minutes.  Patient was given 2 doses of Narcan during this time.  He admitted to snorting heroin yesterday afternoon and felt "that he is about to get heatstroke" and did not remember anything that happened ever since.  His mother was at bedside during the admission and states that he has poorly controlled schizophrenia and has been noncompliant with his psychiatric medications.  Per patient's mother he is at several times claimed that he is wanted to end his life.  Because of his unresponsive status he was brought in and placed on BiPAP.  Prior to this he was agitated and pulling out his lines and oxygen.  He was admitted for acute toxic encephalopathy in the setting of drug overdose from heroin and also admitted for aspiration pneumonia as well as acute hypoxic respiratory failure in setting of aspiration pneumonia.  Assessment & Plan:   Active Problems:   Aspiration pneumonia (HCC)   Hypoxia  Acute Respiratory Failure with Hypoxia in the setting of Aspiration Pneumonia -He was placed in the progressive care unit -Placed on aspiration precautions and started on IV Zosyn -Continue with DuoNebs every 6 hours as needed -Continue with guaifenesin 12 mg p.o. twice daily -SpO2: 94 % O2 Flow Rate (L/min): 4 L/min FiO2 (%): 36 % -Continue to monitor respiratory status carefully and repeat chest x-ray in  a.m.  Acute Heroin Overdose -Continue with IV fluid hydration -Clonidine 0.1 mg p.o. 3 times daily as needed for agitation or withdrawal -Patient was placed on a CIWA protocol given concern for possible alcohol withdrawal and concomitant Abuse; continue with folic acid, multivitamin and thiamine -Continue with hydroxyzine 25 mg p.o. 3 times daily as needed anxiety and itching  Acute encephalopathy in the setting of toxic encephalopathy due to overdose -Continues to be lethargic and persistently encephalopathic -Continue with naloxone 0.4 mg IV as needed -Delirium precautions  Sepsis secondary to aspiration pneumonia, present on admission -Scented and had a WBC of 22.8 and lactic acid level 4.4 and patient was tachypneic and tachycardic with a source of infection with aspiration pneumonia -Aspiration precaution placed on IV Zosyn -Currently getting IV fluid hydration with lactated Ringer's at 125 MLS per hour -Blood cultures x2 show no growth to date at 2 days -Continue monitor carefully and repeat chest x-ray in a.m.  -Chest x-ray yesterday a.m. showed "No interval change. Linear interstitial opacities suggesting interstitial edema or bronchiolitis."  Schizophrenia -Poorly controlled And he has been noncompliant with his medications  -Psychiatry has been consulted for further evaluation -Safety Sitter  -Resumed quetiapine 25 mg p.o. twice daily  Hyponatremia -Mild and improved.  Patient's sodium went from 131 is now 137 -Continue IV fluid hydration with lactated Ringer's at 125 MLS per hour -Continue monitor and trend and repeat CMP in a.m.  Hyperkalemia -Mild at 5.8 and improved after patient was given Lasix.  Potassium is now 4.1 -Was given sodium polystyrene 15 g -Continue  monitor and trend and repeat CMP in the a.m.  Diabetes mellitus type 2 with hyperglycemia -Uncontrolled -Patient's hemoglobin A1c was 8.8 -CBGs ranging from 175-216 -Currently the patient is on a  moderate NovoLog sliding scale insulin AC  Obesity -Complicates overall prognosis and care -Estimated body mass index is 32.08 kg/m as calculated from the following:   Height as of 11/17/19:  (1.803 m).   Weight as of 11/17/19: 104.3 kg. -Weight Loss and Dietary Counseling given    DVT prophylaxis: Enoxaparin 40 g subcu every 24 Code Status: FULL CODE Family Communication: No family present at bedside  Disposition Plan: Pending further clinical improvement and evaluation with psychiatry  Status is: Inpatient  Remains inpatient appropriate because:Unsafe d/c plan, IV treatments appropriate due to intensity of illness or inability to take PO, and Inpatient level of care appropriate due to severity of illness  Dispo: The patient is from: Home              Anticipated d/c is to:  TBD              Patient currently is medically stable to d/c.   Difficult to place patient No  Consultants:  Psychiatry  Procedures: None  Antimicrobials:  Anti-infectives (From admission, onward)    Start     Dose/Rate Route Frequency Ordered Stop   12/02/20 2245  Ampicillin-Sulbactam (UNASYN) 3 g in sodium chloride 0.9 % 100 mL IVPB        3 g 200 mL/hr over 30 Minutes Intravenous Every 6 hours 12/02/20 2243     12/02/20 1900  Ampicillin-Sulbactam (UNASYN) 3 g in sodium chloride 0.9 % 100 mL IVPB  Status:  Discontinued        3 g 200 mL/hr over 30 Minutes Intravenous Every 6 hours 12/02/20 1848 12/02/20 1848   12/02/20 1715  ampicillin-sulbactam (UNASYN) 1.5 g in sodium chloride 0.9 % 100 mL IVPB  Status:  Discontinued        1.5 g 200 mL/hr over 30 Minutes Intravenous  Once 12/02/20 1707 12/02/20 1848        Subjective: Seen and examined at bedside and he was extremely lethargic and confused still.  Unable to find a real subjective history given his current condition.  No complaints noted.  No other concerns or complaints this time.  Objective: Vitals:   12/04/20 0013 12/04/20 0303  12/04/20 0726 12/04/20 0800  BP: 132/66 137/66  136/64  Pulse: 98 98  96  Resp: Temp:  98.3 F (36.8 C)    TempSrc:  Oral    SpO2: 98% 97% 94% 93%    Intake/Output Summary (Last 24 hours) at 12/04/2020 0814 Last data filed at 12/03/2020 2259 Gross per 24 hour  Intake 72.93 ml  Output 600 ml  Net -527.07 ml   There were no vitals filed for this visit.  Examination: Physical Exam:  Constitutional: WN/WD overweight male currently in NAD and appears calm and comfortable Eyes: Lids and conjunctivae normal, sclerae anicteric  ENMT: External Ears, Nose appear normal. Grossly normal hearing.  Neck: Appears normal, supple, no cervical masses, normal ROM, no appreciable thyromegaly; no JVD Respiratory: Diminished to auscultation bilaterally with coarse breath sounds, no wheezing, rales, rhonchi or crackles. Normal respiratory effort and patient is not tachypenic. No accessory muscle use.  Unlabored breathing Cardiovascular: Tachycardic rate but regular rhythm, no murmurs / rubs / gallops. S1 and S2 auscultated.  Trace lower extremity edema Abdomen: Soft, non-tender, distended secondary  body habitus.  Bowel sounds positive.  GU: Deferred. Musculoskeletal: No clubbing / cyanosis of digits/nails. No joint deformity upper and lower extremities.  Skin: No rashes, lesions, ulcers on limited skin evaluation. No induration; Warm and dry.  Neurologic: Patient is lethargic and somnolent and does not follow commands Psychiatric: Impaired judgment and insight.  He is not alert and oriented x 3. Normal mood and appropriate affect.   Data Reviewed: I have personally reviewed following labs and imaging studies  CBC: Recent Labs  Lab 12/02/20 1523 12/02/20 1833 12/02/20 2030 12/03/20 0030 12/04/20 0020  WBC 15.6*  --  22.8* 25.8* 11.0*  NEUTROABS 10.3*  --   --  23.6*  --   HGB 16.2 16.7 16.5 16.1 13.4  HCT 46.1 49.0 48.8 46.5 40.3  MCV 85.2  --  88.4 86.0 89.0  PLT 366  --  305  338 223   Basic Metabolic Panel: Recent Labs  Lab 12/02/20 1523 12/02/20 1833 12/02/20 2030 12/03/20 0418 12/04/20 0020  NA 132* 134* 135 131* 137  K 4.2 4.3 4.4 5.8* 4.1  CL 95*  --  96* 99 98  CO2 24  --  28 25 31   GLUCOSE 434*  --  299* 280* 156*  BUN 9  --  9 15 10   CREATININE 1.25*  --  1.19 1.22 0.97  CALCIUM 9.1  --  9.1 8.4* 8.8*  MG  --   --  1.9  --   --   PHOS  --   --  6.1*  --   --    GFR: CrCl cannot be calculated (Unknown ideal weight.). Liver Function Tests: Recent Labs  Lab 12/02/20 1523 12/02/20 2030  AST 28 28  ALT 33 35  ALKPHOS 86 90  BILITOT 0.4 0.6  PROT 7.0 7.7  ALBUMIN 3.6 4.1   No results for input(s): LIPASE, AMYLASE in the last 168 hours. No results for input(s): AMMONIA in the last 168 hours. Coagulation Profile: No results for input(s): INR, PROTIME in the last 168 hours. Cardiac Enzymes: No results for input(s): CKTOTAL, CKMB, CKMBINDEX, TROPONINI in the last 168 hours. BNP (last 3 results) No results for input(s): PROBNP in the last 8760 hours. HbA1C: Recent Labs    12/02/20 2030  HGBA1C 8.8*   CBG: Recent Labs  Lab 12/03/20 0632 12/03/20 1139 12/03/20 1552 12/03/20 2057 12/04/20 0619  GLUCAP 208* 216* 223* 206* 189*   Lipid Profile: No results for input(s): CHOL, HDL, LDLCALC, TRIG, CHOLHDL, LDLDIRECT in the last 72 hours. Thyroid Function Tests: No results for input(s): TSH, T4TOTAL, FREET4, T3FREE, THYROIDAB in the last 72 hours. Anemia Panel: No results for input(s): VITAMINB12, FOLATE, FERRITIN, TIBC, IRON, RETICCTPCT in the last 72 hours. Sepsis Labs: Recent Labs  Lab 12/02/20 1705 12/02/20 2030  LATICACIDVEN 3.1* 4.4*    Recent Results (from the past 240 hour(s))  Resp Panel by RT-PCR (Flu A&B, Covid) Nasopharyngeal Swab     Status: None   Collection Time: 12/02/20  3:43 PM   Specimen: Nasopharyngeal Swab; Nasopharyngeal(NP) swabs in vial transport medium  Result Value Ref Range Status   SARS  Coronavirus 2 by RT PCR NEGATIVE NEGATIVE Final    Comment: (NOTE) SARS-CoV-2 target nucleic acids are NOT DETECTED.  The SARS-CoV-2 RNA is generally detectable in upper respiratory specimens during the acute phase of infection. The lowest concentration of SARS-CoV-2 viral copies this assay can detect is 138 copies/mL. A negative result does not preclude SARS-Cov-2 infection and should not be  used as the sole basis for treatment or other patient management decisions. A negative result may occur with  improper specimen collection/handling, submission of specimen other than nasopharyngeal swab, presence of viral mutation(s) within the areas targeted by this assay, and inadequate number of viral copies(<138 copies/mL). A negative result must be combined with clinical observations, patient history, and epidemiological information. The expected result is Negative.  Fact Sheet for Patients:  BloggerCourse.com  Fact Sheet for Healthcare Providers:  SeriousBroker.it  This test is no t yet approved or cleared by the [place] FDA and  has been authorized for detection and/or diagnosis of SARS-CoV-2 by FDA under an Emergency Use Authorization (EUA). This EUA will remain  in effect (meaning this test can be used) for the duration of the COVID-19 declaration under Section 564(b)(1) of the Act, 21 U.S.C.section 360bbb-3(b)(1), unless the authorization is terminated  or revoked sooner.       Influenza A by PCR NEGATIVE NEGATIVE Final   Influenza B by PCR NEGATIVE NEGATIVE Final    Comment: (NOTE) The Xpert Xpress SARS-CoV-2/FLU/RSV plus assay is intended as an aid in the diagnosis of influenza from Nasopharyngeal swab specimens and should not be used as a sole basis for treatment. Nasal washings and aspirates are unacceptable for Xpert Xpress SARS-CoV-2/FLU/RSV testing.  Fact Sheet for  Patients: BloggerCourse.com  Fact Sheet for Healthcare Providers: SeriousBroker.it  This test is not yet approved or cleared by the [place] FDA and has been authorized for detection and/or diagnosis of SARS-CoV-2 by FDA under an Emergency Use Authorization (EUA). This EUA will remain in effect (meaning this test can be used) for the duration of the COVID-19 declaration under Section 564(b)(1) of the Act, 21 U.S.C. section 360bbb-3(b)(1), unless the authorization is terminated or revoked.  Performed at [facility], [address], [place], [place] [address]   Blood culture (routine x 2)     Status: None (Preliminary result)   Collection Time: 12/02/20  5:27 PM   Specimen: BLOOD RIGHT HAND  Result Value Ref Range Status   Specimen Description BLOOD RIGHT HAND  Final   Special Requests   Final    BOTTLES DRAWN AEROBIC AND ANAEROBIC Blood Culture results may not be optimal due to an inadequate volume of blood received in culture bottles   Culture   Final    NO GROWTH 2 DAYS Performed at [facility], [address] [place], [place] [address]    Report Status PENDING  Incomplete  Blood culture (routine x 2)     Status: None (Preliminary result)   Collection Time: 12/02/20  6:27 PM   Specimen: BLOOD RIGHT WRIST  Result Value Ref Range Status   Specimen Description BLOOD RIGHT WRIST  Final   Special Requests   Final    BOTTLES DRAWN AEROBIC ONLY Blood Culture results may not be optimal due to an inadequate volume of blood received in culture bottles   Culture   Final    NO GROWTH 2 DAYS Performed at [facility], [address], [place], [place] [address]    Report Status PENDING  Incomplete  MRSA Next Gen by PCR, Nasal     Status: None   Collection Time: 12/02/20 11:37 PM   Specimen: Nasal Mucosa; Nasal Swab  Result Value Ref Range Status   MRSA by PCR Next Gen NOT DETECTED NOT DETECTED Final     Comment: (NOTE) The GeneXpert MRSA Assay (FDA approved for NASAL specimens only), is  one component of a comprehensive MRSA colonization surveillance program. It is not intended to diagnose MRSA infection nor to guide or monitor treatment for MRSA infections. Test performance is not FDA approved in patients less than 36 years old. Performed at [facility], [address] [place], [place] [address]     RN Pressure Injury Documentation:     Estimated body mass index is 32.08 kg/m as calculated from the following:   Height as of 11/17/19: 5\' 11"  (1.803 m).   Weight as of 11/17/19: 104.3 kg.  Malnutrition Type:   Malnutrition Characteristics:   Nutrition Interventions:    Radiology Studies: CT HEAD WO CONTRAST  Result Date: 12/02/2020 CLINICAL DATA:  Delirium EXAM: CT HEAD WITHOUT CONTRAST TECHNIQUE: Contiguous axial images were obtained from the base of the skull through the vertex without intravenous contrast. COMPARISON:  None. FINDINGS: Brain: Normal anatomic configuration. No abnormal intra or extra-axial mass lesion or fluid collection. No abnormal mass effect or midline shift. No evidence of acute intracranial hemorrhage or infarct. Ventricular size is normal. Cerebellum unremarkable. Vascular: Unremarkable Skull: Intact Sinuses/Orbits: There is opacification of several left ethmoid air cells. Remaining paranasal sinuses are clear. No air-fluid levels. Orbits are unremarkable. Other: Mastoid air cells and middle ear cavities are clear. IMPRESSION: No acute intracranial abnormality. Mild left ethmoid sinus disease. Electronically Signed   By: Helyn NumbersAshesh  Parikh MD   On: 12/02/2020 19:36   DG Chest Port 1 View  Result Date: 12/03/2020 CLINICAL DATA:  pna EXAM: PORTABLE CHEST 1 VIEW COMPARISON:  12/02/2020 FINDINGS: Normal cardiac silhouette with low lung volumes. Diffuse interstitial linear prominence. No focal consolidation. No pneumothorax. IMPRESSION: 1. No interval change. 2.  Linear interstitial opacities suggesting interstitial edema or bronchiolitis. Electronically Signed   By: Genevive BiStewart  Edmunds M.D.   On: 12/03/2020 08:27   DG Chest Portable 1 View  Result Date: 12/02/2020 CLINICAL DATA:  Wheezing EXAM: PORTABLE CHEST 1 VIEW COMPARISON:  04/09/2020, 06/05/2015 FINDINGS: Slightly diminished lung volumes. Diffuse bilateral interstitial and streaky opacity. No pleural effusion, focal consolidation or pneumothorax. IMPRESSION: Low lung volumes. Mild diffuse interstitial and streaky lung opacity which could be secondary to central airways inflammatory process versus atypical/viral pneumonia. Electronically Signed   By: Jasmine PangKim  Fujinaga M.D.   On: 12/02/2020 15:31    Scheduled Meds:  enoxaparin (LOVENOX) injection  40 mg Subcutaneous Q24H   folic acid  1 mg Oral Daily   guaiFENesin  1,200 mg Oral BID   insulin aspart  0-15 Units Subcutaneous TID WC   multivitamin with minerals  1 tablet Oral Daily   QUEtiapine  25 mg Oral BID   sodium polystyrene  15 g Oral Once   thiamine  100 mg Oral Daily   Or   thiamine  100 mg Intravenous Daily   Continuous Infusions:  ampicillin-sulbactam (UNASYN) IV 3 g (12/04/20 0402)   lactated ringers 125 mL/hr at 12/03/20 2259    LOS: 2 days   Merlene Laughtermair Latif Huxley Vanwagoner, DO [facility] PAGER is on AMION  If 7PM-7AM, please contact night-coverage [email or web address]

## 2020-12-04 NOTE — Progress Notes (Signed)
Call from patient's ACT team Willodean Rosenthal called to inform our unit patient's  Prolixin injection is due on Friday

## 2020-12-04 NOTE — Plan of Care (Signed)
  Problem: Education: Goal: Knowledge of General Education information will improve Description: Including pain rating scale, medication(s)/side effects and non-pharmacologic comfort measures Outcome: Not Met (add Reason)  Patient solemnant

## 2020-12-05 ENCOUNTER — Inpatient Hospital Stay (HOSPITAL_COMMUNITY): Payer: Medicare Other

## 2020-12-05 DIAGNOSIS — A419 Sepsis, unspecified organism: Secondary | ICD-10-CM | POA: Diagnosis not present

## 2020-12-05 DIAGNOSIS — J690 Pneumonitis due to inhalation of food and vomit: Secondary | ICD-10-CM | POA: Diagnosis not present

## 2020-12-05 DIAGNOSIS — T401X4A Poisoning by heroin, undetermined, initial encounter: Secondary | ICD-10-CM | POA: Diagnosis not present

## 2020-12-05 DIAGNOSIS — R6520 Severe sepsis without septic shock: Secondary | ICD-10-CM | POA: Diagnosis not present

## 2020-12-05 LAB — COMPREHENSIVE METABOLIC PANEL
ALT: 24 U/L (ref 0–44)
AST: 19 U/L (ref 15–41)
Albumin: 3.3 g/dL — ABNORMAL LOW (ref 3.5–5.0)
Alkaline Phosphatase: 69 U/L (ref 38–126)
Anion gap: 6 (ref 5–15)
BUN: 6 mg/dL (ref 6–20)
CO2: 32 mmol/L (ref 22–32)
Calcium: 9 mg/dL (ref 8.9–10.3)
Chloride: 97 mmol/L — ABNORMAL LOW (ref 98–111)
Creatinine, Ser: 0.81 mg/dL (ref 0.61–1.24)
GFR, Estimated: >60 mL/min (ref >60)
Glucose, Bld: 116 mg/dL — ABNORMAL HIGH (ref 70–99)
Potassium: 3.6 mmol/L (ref 3.5–5.1)
Sodium: 135 mmol/L (ref 135–145)
Total Bilirubin: 1.2 mg/dL (ref 0.3–1.2)
Total Protein: 6.9 g/dL (ref 6.5–8.1)

## 2020-12-05 LAB — CBC WITH DIFFERENTIAL/PLATELET
Abs Immature Granulocytes: 0.07 10*3/uL (ref 0.00–0.07)
Basophils Absolute: 0 10*3/uL (ref 0.0–0.1)
Basophils Relative: 0 %
Eosinophils Absolute: 0.3 10*3/uL (ref 0.0–0.5)
Eosinophils Relative: 2 %
HCT: 41.2 % (ref 39.0–52.0)
Hemoglobin: 14.2 g/dL (ref 13.0–17.0)
Immature Granulocytes: 1 %
Lymphocytes Relative: 23 %
Lymphs Abs: 2.7 10*3/uL (ref 0.7–4.0)
MCH: 29.7 pg (ref 26.0–34.0)
MCHC: 34.5 g/dL (ref 30.0–36.0)
MCV: 86.2 fL (ref 80.0–100.0)
Monocytes Absolute: 1.2 10*3/uL — ABNORMAL HIGH (ref 0.1–1.0)
Monocytes Relative: 10 %
Neutro Abs: 7.6 10*3/uL (ref 1.7–7.7)
Neutrophils Relative %: 64 %
Platelets: 252 10*3/uL (ref 150–400)
RBC: 4.78 MIL/uL (ref 4.22–5.81)
RDW: 11 % — ABNORMAL LOW (ref 11.5–15.5)
WBC: 11.8 10*3/uL — ABNORMAL HIGH (ref 4.0–10.5)
nRBC: 0 % (ref 0.0–0.2)

## 2020-12-05 LAB — GLUCOSE, CAPILLARY
Glucose-Capillary: 108 mg/dL — ABNORMAL HIGH (ref 70–99)
Glucose-Capillary: 137 mg/dL — ABNORMAL HIGH (ref 70–99)
Glucose-Capillary: 124 mg/dL — ABNORMAL HIGH (ref 70–99)

## 2020-12-05 LAB — MAGNESIUM: Magnesium: 1.9 mg/dL (ref 1.7–2.4)

## 2020-12-05 LAB — PHOSPHORUS: Phosphorus: 3.1 mg/dL (ref 2.5–4.6)

## 2020-12-05 MED ORDER — LOPERAMIDE HCL 2 MG PO CAPS: 2.0000 mg | ORAL_CAPSULE | ORAL | Status: DC | PRN

## 2020-12-05 MED ORDER — HALOPERIDOL 5 MG PO TABS
5.0000 mg | ORAL_TABLET | Freq: Three times a day (TID) | ORAL | Status: DC | PRN
  Filled 2020-12-05: qty 1

## 2020-12-05 MED ORDER — HYDROXYZINE HCL 25 MG PO TABS
25.0000 mg | ORAL_TABLET | Freq: Four times a day (QID) | ORAL | Status: DC | PRN
  Filled 2020-12-05: qty 1

## 2020-12-05 MED ORDER — HALOPERIDOL LACTATE 5 MG/ML IJ SOLN
2.0000 mg | Freq: Four times a day (QID) | INTRAMUSCULAR | Status: DC | PRN
  Administered 2020-12-05: 2 mg via INTRAVENOUS

## 2020-12-05 MED ORDER — ADULT MULTIVITAMIN W/MINERALS CH: 1.0000 | ORAL_TABLET | Freq: Every day | ORAL | Status: DC

## 2020-12-05 MED ORDER — LORAZEPAM 2 MG/ML IJ SOLN
1.0000 mg | INTRAMUSCULAR | Status: DC | PRN
  Administered 2020-12-05 – 2020-12-06 (×5): 2 mg via INTRAVENOUS
  Administered 2020-12-06: 1 mg via INTRAVENOUS
  Administered 2020-12-06 (×2): 2 mg via INTRAVENOUS
  Administered 2020-12-06: 3 mg via INTRAVENOUS
  Administered 2020-12-07 (×2): 2 mg via INTRAVENOUS
  Filled 2020-12-05: qty 2
  Filled 2020-12-05 (×12): qty 1

## 2020-12-05 MED ORDER — CHLORDIAZEPOXIDE HCL 25 MG PO CAPS
25.0000 mg | ORAL_CAPSULE | Freq: Four times a day (QID) | ORAL | Status: DC | PRN
  Administered 2020-12-05: 25 mg via ORAL
  Filled 2020-12-05: qty 1

## 2020-12-05 MED ORDER — BENZTROPINE MESYLATE 0.5 MG PO TABS
0.5000 mg | ORAL_TABLET | Freq: Two times a day (BID) | ORAL | Status: DC
  Administered 2020-12-05: 0.5 mg via ORAL
  Filled 2020-12-05 (×3): qty 1

## 2020-12-05 MED ORDER — HALOPERIDOL LACTATE 5 MG/ML IJ SOLN
5.0000 mg | Freq: Three times a day (TID) | INTRAMUSCULAR | Status: DC | PRN
  Administered 2020-12-05: 5 mg via INTRAMUSCULAR
  Filled 2020-12-05: qty 1

## 2020-12-05 MED ORDER — HALOPERIDOL LACTATE 5 MG/ML IJ SOLN
INTRAMUSCULAR | Status: AC
  Filled 2020-12-05: qty 1

## 2020-12-05 MED ORDER — LORAZEPAM 2 MG/ML IJ SOLN
1.0000 mg | INTRAMUSCULAR | Status: DC | PRN
Start: 2020-12-05 — End: 2020-12-05
  Administered 2020-12-05: 1 mg via INTRAVENOUS
  Filled 2020-12-05: qty 1

## 2020-12-05 MED ORDER — LORAZEPAM 1 MG PO TABS: 1.0000 mg | ORAL_TABLET | ORAL | Status: DC | PRN

## 2020-12-05 NOTE — Consult Note (Signed)
Patient too sedated to evaluate likely due to multiple ativan administration. Will re-assess tomorrow.  Recommend the following for agitation: -Haloperidol 5 mg tid po/im prn for agitation -Cogentin 0.5 mg BID -Librium detox protocol to taper off Ativan  Thank you for the consult, Park Pope, MD PGY1 Psychiatry Resident

## 2020-12-05 NOTE — Progress Notes (Signed)
Spoke with Dr. Rachael Darby for diet order for patient. MD states to keep NPO, hold oral meds. Ativan 1mg  ordered for CIWA and anxiety. Ativan given for CIWA of 17. Ativan Ineffective. Patient attempting to climb out of bed, restless, repeat CIWA at 15. Combative with staff when redirected. Dr. paged.

## 2020-12-05 NOTE — Progress Notes (Signed)
BIPAP still in room but not needed at this time.

## 2020-12-05 NOTE — Progress Notes (Signed)
PROGRESS NOTE    [name]  YTK:[id number] DOB: 1986-05-05 DOA: 12/02/2020 PCP: Pcp, No   Brief Narrative:  The patient is an obese 35 year old male with a past medical history significant for but not limited to polysubstance abuse, schizoaffective disorder, anxiety and depression as well as other comorbidities who presented with altered mental status.  Patient was found facing down the smoke shop unresponsive.  EMS was called and patient was found to have pinpoint pupils and agonal breathing and an O2 saturation of 45%.  He was placed on a nonrebreather and O2 saturations improved after about 20 minutes.  Patient was given 2 doses of Narcan during this time.  He admitted to snorting heroin yesterday afternoon and felt "that he is about to get heatstroke" and did not remember anything that happened ever since.  His mother was at bedside during the admission and states that he has poorly controlled schizophrenia and has been noncompliant with his psychiatric medications.  Per patient's mother he is at several times claimed that he is wanted to end his life.  Because of his unresponsive status he was brought in and placed on BiPAP.  Prior to this he was agitated and pulling out his lines and oxygen.  He was admitted for acute toxic encephalopathy in the setting of drug overdose from heroin and also admitted for aspiration pneumonia as well as acute hypoxic respiratory failure in setting of aspiration pneumonia.  Assessment & Plan:   Active Problems:   Aspiration pneumonia (HCC)   Hypoxia  Acute Respiratory Failure with Hypoxia in the setting of Aspiration Pneumonia -He was placed in the progressive care unit -Placed on aspiration precautions and started on IV Zosyn -Continue with DuoNebs every 6 hours as needed -Continue with guaifenesin 12 mg p.o. twice daily -SpO2: 99 % O2 Flow Rate (L/min): (P) 4 L/min FiO2 (%): (S) 50 % -Continue to monitor respiratory status carefully and repeat chest  x-ray showed "Borderline cardiomegaly.  No pulmonary venous congestion. Lung volumes with persistent bibasilar atelectasis/infiltrates."  Acute Heroin Overdose -Continue with IV fluid hydration -Clonidine 0.1 mg p.o. 3 times daily as needed for agitation or withdrawal -Patient was placed on a CIWA protocol given concern for possible alcohol withdrawal and concomitant Abuse; continue with folic acid, multivitamin and thiamine; Will stop Ativan given sedation and star Librium Taper -Continue with hydroxyzine 25 mg p.o. 3 times daily as needed anxiety and itching -Continue to monitor   Acute encephalopathy in the setting of toxic encephalopathy due to overdose -Continues to be lethargic and persistently encephalopathic -Continue with naloxone 0.4 mg IV as needed -Delirium precautions -Psych consulted and recommending changing Ativan to Librium and adding Haldol   Sepsis secondary to aspiration pneumonia, present on admission -Scented and had a WBC of 22.8 and lactic acid level 4.4 and patient was tachypneic and tachycardic with a source of infection with aspiration pneumonia; WBC is now 11.8 -Aspiration precaution placed on IV Zosyn -Currently getting IV fluid hydration with lactated Ringer's at 125 MLS per hour -Blood cultures x2 show no growth to date at 3 days -Continue monitor carefully and repeat chest x-ray in a.m.  -Chest x-ray this AM as above  Schizophrenia -Poorly controlled And he has been noncompliant with his medications  -Psychiatry has been consulted for further evaluation patient was too sedated so psychiatry recommending changing medications for his agitation and changing to haloperidol 5 mg 3 times daily p.o. IM for as needed agitation and adding Cogentin 0.5 mg p.o. twice daily and  Librium detox protocol to taper off Ativan -Safety Sitter  -Resumed quetiapine 25 mg p.o. twice daily  Hyponatremia -Mild and improved.  Patient's sodium went from 131 is now 137 yesterday  and today is 135 -Continue IV fluid hydration with lactated Ringer's at 125 MLS per hour -Continue monitor and trend and repeat CMP in a.m.  Hyperkalemia -Mild at 5.8 and improved after patient was given Lasix.  Potassium is now 3.6 -Was given sodium polystyrene 15 g -Continue monitor and trend and repeat CMP in the a.m.  Diabetes mellitus type 2 with hyperglycemia -Uncontrolled -Patient's hemoglobin A1c was 8.8 -CBGs ranging from 108-175 -Currently the patient is on a moderate NovoLog sliding scale insulin AC  Obesity -Complicates overall prognosis and care -Estimated body mass index is 32.08 kg/m as calculated from the following:   Height as of 11/17/19:  (1.803 m).   Weight as of 11/17/19: 104.3 kg. -Weight Loss and Dietary Counseling given    DVT prophylaxis: Enoxaparin 40 g subcu every 24 Code Status: FULL CODE Family Communication: No family present at bedside  Disposition Plan: Pending further clinical improvement and evaluation with psychiatry  Status is: Inpatient  Remains inpatient appropriate because:Unsafe d/c plan, IV treatments appropriate due to intensity of illness or inability to take PO, and Inpatient level of care appropriate due to severity of illness  Dispo: The patient is from: Home              Anticipated d/c is to:  TBD              Patient currently is medically stable to d/c.   Difficult to place patient No  Consultants:  Psychiatry  Procedures: None  Antimicrobials:  Anti-infectives (From admission, onward)    Start     Dose/Rate Route Frequency Ordered Stop   12/02/20 2245  Ampicillin-Sulbactam (UNASYN) 3 g in sodium chloride 0.9 % 100 mL IVPB        3 g 200 mL/hr over 30 Minutes Intravenous Every 6 hours 12/02/20 2243     12/02/20 1900  Ampicillin-Sulbactam (UNASYN) 3 g in sodium chloride 0.9 % 100 mL IVPB  Status:  Discontinued        3 g 200 mL/hr over 30 Minutes Intravenous Every 6 hours 12/02/20 1848 12/02/20 1848   12/02/20  1715  ampicillin-sulbactam (UNASYN) 1.5 g in sodium chloride 0.9 % 100 mL IVPB  Status:  Discontinued        1.5 g 200 mL/hr over 30 Minutes Intravenous  Once 12/02/20 1707 12/02/20 1848        Subjective: Seen and examined at bedside and he was lethargic again.  Still a little confusion withdrawn and agitated.  Unable to provide a subjective history given his current condition.  Mother requesting a letter for court given that he is a court date on Monday.  No other complaints or concerns at this time  Objective: Vitals:   12/05/20 0752 12/05/20 1150 12/05/20 1334 12/05/20 1552  BP: (!) 116/57 138/88  (!) 180/71  Pulse: 96 (!) 102 (!) 108 95  Resp: Temp: 98.2 F (36.8 C) 98.7 F (37.1 C)  98.4 F (36.9 C)  TempSrc: Axillary Oral  Oral  SpO2: 100% 98%  99%    Intake/Output Summary (Last 24 hours) at 12/05/2020 1652 Last data filed at 12/05/2020 1402 Gross per 24 hour  Intake 4531.55 ml  Output 1900 ml  Net 2631.55 ml    There were no vitals  filed for this visit.  Examination: Physical Exam:  Constitutional: WN/WD overweight Male in NAD and appears calm and comfortable Eyes:  Lids and conjunctivae normal, sclerae anicteric  ENMT: External Ears, Nose appear normal. Grossly normal hearing.  Neck: Appears normal, supple, no cervical masses, normal ROM, no appreciable thyromegaly; no JVD Respiratory: Diminished to auscultation bilaterally with coarse breath sounds, no wheezing, rales, rhonchi or crackles. Normal respiratory effort and patient is not tachypenic. No accessory muscle use.  Unlabored breathing but wearing supplemental oxygen via nasal cannula Cardiovascular: Tachycardic rate but regular rhythm, no murmurs / rubs / gallops. S1 and S2 auscultated.  Has trace lower extremity edema Abdomen: Soft, non-tender, non-distended. Bowel sounds positive.  GU: Deferred. Musculoskeletal: No clubbing / cyanosis of digits/nails. No joint deformity upper and lower  extremities.  Skin: No rashes, lesions, ulcers on limited skin evaluation. No induration; Warm and dry.  Neurologic: Patient is lethargic and somnolent still Psychiatric: Impaired judgment and insight. He is drowsy and somnolent. Normal mood and appropriate affect.   Data Reviewed: I have personally reviewed following labs and imaging studies  CBC: Recent Labs  Lab 12/02/20 1523 12/02/20 1833 12/02/20 2030 12/03/20 0030 12/04/20 0020 12/05/20 0042  WBC 15.6*  --  22.8* 25.8* 11.0* 11.8*  NEUTROABS 10.3*  --   --  23.6*  --  7.6  HGB 16.2 16.7 16.5 16.1 13.4 14.2  HCT 46.1 49.0 48.8 46.5 40.3 41.2  MCV 85.2  --  88.4 86.0 89.0 86.2  PLT 366  --  305 338 223 252    Basic Metabolic Panel: Recent Labs  Lab 12/02/20 1523 12/02/20 1833 12/02/20 2030 12/03/20 0418 12/04/20 0020 12/05/20 0042  NA 132* 134* 135 131* 137 135  K 4.2 4.3 4.4 5.8* 4.1 3.6  CL 95*  --  96* 99 98 97*  CO2 24  --  28 25 31  32  GLUCOSE 434*  --  299* 280* 156* 116*  BUN 9  --  9 15 10 6   CREATININE 1.25*  --  1.19 1.22 0.97 0.81  CALCIUM 9.1  --  9.1 8.4* 8.8* 9.0  MG  --   --  1.9  --   --  1.9  PHOS  --   --  6.1*  --   --  3.1    GFR: CrCl cannot be calculated (Unknown ideal weight.). Liver Function Tests: Recent Labs  Lab 12/02/20 1523 12/02/20 2030 12/05/20 0042  AST 28 28 19   ALT 33 35 24  ALKPHOS 86 90 69  BILITOT 0.4 0.6 1.2  PROT 7.0 7.7 6.9  ALBUMIN 3.6 4.1 3.3*    No results for input(s): LIPASE, AMYLASE in the last 168 hours. No results for input(s): AMMONIA in the last 168 hours. Coagulation Profile: No results for input(s): INR, PROTIME in the last 168 hours. Cardiac Enzymes: No results for input(s): CKTOTAL, CKMB, CKMBINDEX, TROPONINI in the last 168 hours. BNP (last 3 results) No results for input(s): PROBNP in the last 8760 hours. HbA1C: Recent Labs    12/02/20 2030  HGBA1C 8.8*    CBG: Recent Labs  Lab 12/04/20 1618 12/04/20 2138 12/05/20 0549  12/05/20 1109 12/05/20 1608  GLUCAP 175* 145* 108* 137* 124*    Lipid Profile: No results for input(s): CHOL, HDL, LDLCALC, TRIG, CHOLHDL, LDLDIRECT in the last 72 hours. Thyroid Function Tests: No results for input(s): TSH, T4TOTAL, FREET4, T3FREE, THYROIDAB in the last 72 hours. Anemia Panel: No results for input(s): VITAMINB12, FOLATE, FERRITIN, TIBC,  IRON, RETICCTPCT in the last 72 hours. Sepsis Labs: Recent Labs  Lab 12/02/20 1705 12/02/20 2030  LATICACIDVEN 3.1* 4.4*     Recent Results (from the past 240 hour(s))  Resp Panel by RT-PCR (Flu A&B, Covid) Nasopharyngeal Swab     Status: None   Collection Time: 12/02/20  3:43 PM   Specimen: Nasopharyngeal Swab; Nasopharyngeal(NP) swabs in vial transport medium  Result Value Ref Range Status   SARS Coronavirus 2 by RT PCR NEGATIVE NEGATIVE Final    Comment: (NOTE) SARS-CoV-2 target nucleic acids are NOT DETECTED.  The SARS-CoV-2 RNA is generally detectable in upper respiratory specimens during the acute phase of infection. The lowest concentration of SARS-CoV-2 viral copies this assay can detect is 138 copies/mL. A negative result does not preclude SARS-Cov-2 infection and should not be used as the sole basis for treatment or other patient management decisions. A negative result may occur with  improper specimen collection/handling, submission of specimen other than nasopharyngeal swab, presence of viral mutation(s) within the areas targeted by this assay, and inadequate number of viral copies(<138 copies/mL). A negative result must be combined with clinical observations, patient history, and epidemiological information. The expected result is Negative.  Fact Sheet for Patients:  BloggerCourse.comhttps://www.fda.gov/media/152166/download  Fact Sheet for Healthcare Providers:  SeriousBroker.ithttps://www.fda.gov/media/152162/download  This test is no t yet approved or cleared by the [place] FDA and  has been authorized for detection and/or  diagnosis of SARS-CoV-2 by FDA under an Emergency Use Authorization (EUA). This EUA will remain  in effect (meaning this test can be used) for the duration of the COVID-19 declaration under Section 564(b)(1) of the Act, 21 U.S.C.section 360bbb-3(b)(1), unless the authorization is terminated  or revoked sooner.       Influenza A by PCR NEGATIVE NEGATIVE Final   Influenza B by PCR NEGATIVE NEGATIVE Final    Comment: (NOTE) The Xpert Xpress SARS-CoV-2/FLU/RSV plus assay is intended as an aid in the diagnosis of influenza from Nasopharyngeal swab specimens and should not be used as a sole basis for treatment. Nasal washings and aspirates are unacceptable for Xpert Xpress SARS-CoV-2/FLU/RSV testing.  Fact Sheet for Patients: BloggerCourse.comhttps://www.fda.gov/media/152166/download  Fact Sheet for Healthcare Providers: SeriousBroker.ithttps://www.fda.gov/media/152162/download  This test is not yet approved or cleared by the [place] FDA and has been authorized for detection and/or diagnosis of SARS-CoV-2 by FDA under an Emergency Use Authorization (EUA). This EUA will remain in effect (meaning this test can be used) for the duration of the COVID-19 declaration under Section 564(b)(1) of the Act, 21 U.S.C. section 360bbb-3(b)(1), unless the authorization is terminated or revoked.  Performed at [facility], [address] [place], [place] [address]   Blood culture (routine x 2)     Status: None (Preliminary result)   Collection Time: 12/02/20  5:27 PM   Specimen: BLOOD RIGHT HAND  Result Value Ref Range Status   Specimen Description BLOOD RIGHT HAND  Final   Special Requests   Final    BOTTLES DRAWN AEROBIC AND ANAEROBIC Blood Culture results may not be optimal due to an inadequate volume of blood received in culture bottles   Culture   Final    NO GROWTH 3 DAYS Performed at [facility], [address] [place], [place] [address]    Report Status PENDING  Incomplete  Blood culture  (routine x 2)     Status: None (Preliminary result)   Collection Time: 12/02/20  6:27 PM   Specimen: BLOOD RIGHT WRIST  Result Value  Ref Range Status   Specimen Description BLOOD RIGHT WRIST  Final   Special Requests   Final    BOTTLES DRAWN AEROBIC ONLY Blood Culture results may not be optimal due to an inadequate volume of blood received in culture bottles   Culture   Final    NO GROWTH 3 DAYS Performed at [facility], [address] [place], [place] [address]    Report Status PENDING  Incomplete  MRSA Next Gen by PCR, Nasal     Status: None   Collection Time: 12/02/20 11:37 PM   Specimen: Nasal Mucosa; Nasal Swab  Result Value Ref Range Status   MRSA by PCR Next Gen NOT DETECTED NOT DETECTED Final    Comment: (NOTE) The GeneXpert MRSA Assay (FDA approved for NASAL specimens only), is one component of a comprehensive MRSA colonization surveillance program. It is not intended to diagnose MRSA infection nor to guide or monitor treatment for MRSA infections. Test performance is not FDA approved in patients less than 18 years old. Performed at [facility], [address] [place], [place] [address]      RN Pressure Injury Documentation:     Estimated body mass index is 32.08 kg/m as calculated from the following:   Height as of 11/17/19: 5\' 11"  (1.803 m).   Weight as of 11/17/19: 104.3 kg.  Malnutrition Type:   Malnutrition Characteristics:   Nutrition Interventions:    Radiology Studies: DG CHEST PORT 1 VIEW  Result Date: 12/05/2020 CLINICAL DATA:  Shortness of breath.  Drug overdose. EXAM: PORTABLE CHEST 1 VIEW COMPARISON:  12/03/2020. FINDINGS: Borderline cardiomegaly. No pulmonary venous congestion. Low lung volumes with persistent bibasilar atelectasis/infiltrates. No pleural effusion or pneumothorax. IMPRESSION: 1.  Borderline cardiomegaly.  No pulmonary venous congestion. 2.  Lung volumes with persistent bibasilar atelectasis/infiltrates. Electronically  Signed   By: 12/05/2020  Register   On: 12/05/2020 06:55    Scheduled Meds:  benztropine  0.5 mg Oral BID   enoxaparin (LOVENOX) injection  40 mg Subcutaneous Q24H   folic acid  1 mg Oral Daily   guaiFENesin  1,200 mg Oral BID   insulin aspart  0-15 Units Subcutaneous TID WC   multivitamin with minerals  1 tablet Oral Daily   QUEtiapine  25 mg Oral BID   thiamine  100 mg Oral Daily   Or   thiamine  100 mg Intravenous Daily   Continuous Infusions:  ampicillin-sulbactam (UNASYN) IV 3 g (12/05/20 1631)   lactated ringers 125 mL/hr at 12/05/20 0917    LOS: 3 days   12/07/20, DO [facility] PAGER is on AMION  If 7PM-7AM, please contact night-coverage [email or web address]

## 2020-12-06 ENCOUNTER — Inpatient Hospital Stay (HOSPITAL_COMMUNITY): Payer: Medicare Other

## 2020-12-06 DIAGNOSIS — F209 Schizophrenia, unspecified: Secondary | ICD-10-CM

## 2020-12-06 DIAGNOSIS — T50904A Poisoning by unspecified drugs, medicaments and biological substances, undetermined, initial encounter: Secondary | ICD-10-CM | POA: Diagnosis not present

## 2020-12-06 DIAGNOSIS — J690 Pneumonitis due to inhalation of food and vomit: Secondary | ICD-10-CM | POA: Diagnosis not present

## 2020-12-06 DIAGNOSIS — R410 Disorientation, unspecified: Secondary | ICD-10-CM

## 2020-12-06 DIAGNOSIS — T40604A Poisoning by unspecified narcotics, undetermined, initial encounter: Secondary | ICD-10-CM | POA: Diagnosis not present

## 2020-12-06 LAB — MAGNESIUM: Magnesium: 2 mg/dL (ref 1.7–2.4)

## 2020-12-06 LAB — CBC WITH DIFFERENTIAL/PLATELET
Abs Immature Granulocytes: 0.04 10*3/uL (ref 0.00–0.07)
Basophils Absolute: 0 10*3/uL (ref 0.0–0.1)
Basophils Relative: 0 %
Eosinophils Absolute: 0.4 10*3/uL (ref 0.0–0.5)
Eosinophils Relative: 4 %
HCT: 37.3 % — ABNORMAL LOW (ref 39.0–52.0)
Hemoglobin: 13.5 g/dL (ref 13.0–17.0)
Immature Granulocytes: 1 %
Lymphocytes Relative: 28 %
Lymphs Abs: 2.4 10*3/uL (ref 0.7–4.0)
MCH: 30.3 pg (ref 26.0–34.0)
MCHC: 36.2 g/dL — ABNORMAL HIGH (ref 30.0–36.0)
MCV: 83.6 fL (ref 80.0–100.0)
Monocytes Absolute: 0.8 10*3/uL (ref 0.1–1.0)
Monocytes Relative: 9 %
Neutro Abs: 5.2 10*3/uL (ref 1.7–7.7)
Neutrophils Relative %: 58 %
Platelets: 283 10*3/uL (ref 150–400)
RBC: 4.46 MIL/uL (ref 4.22–5.81)
RDW: 11.1 % — ABNORMAL LOW (ref 11.5–15.5)
WBC: 8.8 10*3/uL (ref 4.0–10.5)
nRBC: 0 % (ref 0.0–0.2)

## 2020-12-06 LAB — COMPREHENSIVE METABOLIC PANEL
ALT: 25 U/L (ref 0–44)
AST: 22 U/L (ref 15–41)
Albumin: 3 g/dL — ABNORMAL LOW (ref 3.5–5.0)
Alkaline Phosphatase: 62 U/L (ref 38–126)
Anion gap: 8 (ref 5–15)
BUN: 7 mg/dL (ref 6–20)
CO2: 30 mmol/L (ref 22–32)
Calcium: 9 mg/dL (ref 8.9–10.3)
Chloride: 101 mmol/L (ref 98–111)
Creatinine, Ser: 0.9 mg/dL (ref 0.61–1.24)
GFR, Estimated: >60 mL/min (ref >60)
Glucose, Bld: 115 mg/dL — ABNORMAL HIGH (ref 70–99)
Potassium: 3.3 mmol/L — ABNORMAL LOW (ref 3.5–5.1)
Sodium: 139 mmol/L (ref 135–145)
Total Bilirubin: 1 mg/dL (ref 0.3–1.2)
Total Protein: 6.2 g/dL — ABNORMAL LOW (ref 6.5–8.1)

## 2020-12-06 LAB — GLUCOSE, CAPILLARY
Glucose-Capillary: 131 mg/dL — ABNORMAL HIGH (ref 70–99)
Glucose-Capillary: 93 mg/dL (ref 70–99)
Glucose-Capillary: 126 mg/dL — ABNORMAL HIGH (ref 70–99)
Glucose-Capillary: 108 mg/dL — ABNORMAL HIGH (ref 70–99)
Glucose-Capillary: 126 mg/dL — ABNORMAL HIGH (ref 70–99)

## 2020-12-06 LAB — PHOSPHORUS: Phosphorus: 3.4 mg/dL (ref 2.5–4.6)

## 2020-12-06 MED ORDER — NAPROXEN 250 MG PO TABS
500.0000 mg | ORAL_TABLET | Freq: Two times a day (BID) | ORAL | Status: DC | PRN
  Filled 2020-12-06: qty 2

## 2020-12-06 MED ORDER — HALOPERIDOL LACTATE 5 MG/ML IJ SOLN
10.0000 mg | Freq: Once | INTRAMUSCULAR | Status: AC
  Administered 2020-12-06: 10 mg via INTRAVENOUS
  Filled 2020-12-06: qty 2

## 2020-12-06 MED ORDER — FLUPHENAZINE DECANOATE 25 MG/ML IJ SOLN
50.0000 mg | Freq: Once | INTRAMUSCULAR | Status: AC
  Administered 2020-12-08: 50 mg via INTRAMUSCULAR
  Filled 2020-12-06: qty 2

## 2020-12-06 MED ORDER — HALOPERIDOL LACTATE 5 MG/ML IJ SOLN: 5.0000 mg | Freq: Two times a day (BID) | INTRAMUSCULAR | Status: DC

## 2020-12-06 MED ORDER — POTASSIUM CHLORIDE 2 MEQ/ML IV SOLN
INTRAVENOUS | Status: DC
  Filled 2020-12-06 (×2): qty 1000

## 2020-12-06 MED ORDER — LORAZEPAM 2 MG/ML IJ SOLN
1.0000 mg | Freq: Two times a day (BID) | INTRAMUSCULAR | Status: DC
  Administered 2020-12-06: 1 mg via INTRAVENOUS
  Filled 2020-12-06: qty 1

## 2020-12-06 MED ORDER — METHOCARBAMOL 500 MG PO TABS: 500.0000 mg | ORAL_TABLET | Freq: Three times a day (TID) | ORAL | Status: DC | PRN

## 2020-12-06 MED ORDER — HALOPERIDOL LACTATE 5 MG/ML IJ SOLN: 10.0000 mg | Freq: Every day | INTRAMUSCULAR | Status: DC

## 2020-12-06 MED ORDER — LOPERAMIDE HCL 2 MG PO CAPS: 2.0000 mg | ORAL_CAPSULE | ORAL | Status: DC | PRN

## 2020-12-06 MED ORDER — BENZTROPINE MESYLATE 1 MG/ML IJ SOLN
1.0000 mg | Freq: Every day | INTRAMUSCULAR | Status: DC
  Filled 2020-12-06: qty 1

## 2020-12-06 MED ORDER — LORAZEPAM 2 MG/ML IJ SOLN: 2.0000 mg | Freq: Every day | INTRAMUSCULAR | Status: DC

## 2020-12-06 MED ORDER — CLONIDINE HCL 0.1 MG PO TABS: 0.1000 mg | ORAL_TABLET | Freq: Every day | ORAL | Status: DC

## 2020-12-06 MED ORDER — CLONIDINE HCL 0.1 MG PO TABS: 0.1000 mg | ORAL_TABLET | ORAL | Status: DC

## 2020-12-06 MED ORDER — DICYCLOMINE HCL 20 MG PO TABS
20.0000 mg | ORAL_TABLET | Freq: Four times a day (QID) | ORAL | Status: DC | PRN
  Filled 2020-12-06: qty 1

## 2020-12-06 MED ORDER — CLONIDINE HCL 0.1 MG PO TABS
0.1000 mg | ORAL_TABLET | Freq: Four times a day (QID) | ORAL | Status: DC
  Administered 2020-12-06 – 2020-12-08 (×6): 0.1 mg via ORAL
  Filled 2020-12-06 (×6): qty 1

## 2020-12-06 MED ORDER — HALOPERIDOL LACTATE 5 MG/ML IJ SOLN
5.0000 mg | Freq: Two times a day (BID) | INTRAMUSCULAR | Status: DC
  Administered 2020-12-06: 5 mg via INTRAVENOUS
  Filled 2020-12-06: qty 1

## 2020-12-06 MED ORDER — BENZTROPINE MESYLATE 1 MG/ML IJ SOLN
1.0000 mg | Freq: Every day | INTRAMUSCULAR | Status: DC
  Administered 2020-12-06 – 2020-12-07 (×2): 1 mg via INTRAVENOUS
  Filled 2020-12-06 (×3): qty 1

## 2020-12-06 MED ORDER — ONDANSETRON 4 MG PO TBDP: 4.0000 mg | ORAL_TABLET | Freq: Four times a day (QID) | ORAL | Status: DC | PRN

## 2020-12-06 MED ORDER — HYDROXYZINE HCL 25 MG PO TABS
25.0000 mg | ORAL_TABLET | Freq: Four times a day (QID) | ORAL | Status: DC | PRN
  Administered 2020-12-06 – 2020-12-08 (×5): 25 mg via ORAL
  Filled 2020-12-06 (×5): qty 1

## 2020-12-06 NOTE — Progress Notes (Signed)
Pt comfortable and resting with MD at bedside. Pt follows minimal commands and is overall cooperative.   10 minutes later, patient wet the bed and became incredibly agitated with staff when we tried to get him cleaned up. Yelling, cursing, and swinging at staff. Pt tried ripping off mitts and all leads.  Full linen change, mitts changed. Patient settled down once staff numbers in the room decreased, however cursing continued.

## 2020-12-06 NOTE — Consult Note (Signed)
Patient too sedated this am to assess.  Pt stated he was feeling "sluggish" before falling asleep again. No UDS/Blood Alcohol Level documented  Recommendations for agitation: -10:00: IV Haldol 5 mg, 1 mg ativan -18:00: IV Haldol 10 mg, 2 mg ativan -02:00: IV Haldol 5 mg, 1 mg ativan -D/C librium as patient failed swallow test -Cogentin 1 mg qHS -Recommend D/C Seroquel  -Recommend retest swallow tomorrow -Recommend EKG to evaluate QTc -Placed order for prolixin LAI injection for Friday per ACT team.   Will continue to follow.  Thank you for the consult, Park Pope, MD PGY1 Psychiatry Consult

## 2020-12-06 NOTE — Progress Notes (Signed)
Pt became agitated with tech. Tech called from intercom system for more hands to help. This RN was in the process of CIWA assessment when a male scream from a staff member in the room was heard. This RN pressed her duress button and entered room to find patient, naked, with his back to the corner of the room (between bathroom and window) screaming at staff. Pt said "I don't know none of ya'll fuckers". This RN asked  RN Haze Justin to pull 3mg  Ativan IV.   This RN tried refocusing patient on this morning's encounters. Pt recognized this RN. Pt then accused this RN of "yeah you did some shady shit too". RN informed patient that we needed him to sit on the bed before he fell. Pt hesitantly complied, staggered, and flopped down to a sitting position on the very end of the bed. RN then convinced patient to put gown back on, with assistance. Security then arrived.   RN BRoddy verbalized 3mg  Ativan, diluted in NS syringe. This RN administered 3mg  Ativan diluted with NS via IV, flushed w/ 3 cc NS. Pt allowed this RN and Director to assist him with standing and moving towards the head of the bed. More cursing, but complied. Pt swayed on edge of bed and was convinced by this RN to get back into bed fully.  MD notified @ 1710 via secure chat. MD immediately responded. See new orders.

## 2020-12-06 NOTE — Progress Notes (Signed)
Mitts removed, per pt request. RN educated that it pt begins pulling at lines that the mitts will be replaced. Pt endorses understanding.   RN 1:1 sitter at bedside. Pt visibly restless and cannot sit still for more than a few seconds, but is not pulling at lines.

## 2020-12-06 NOTE — Progress Notes (Signed)
PROGRESS NOTE    [name]  HDQ:[id number] DOB: [birth year] DOA: 12/02/2020 PCP: Pcp, No   Brief Narrative:  The patient is an obese 35 year old male with a past medical history significant for but not limited to polysubstance abuse, schizoaffective disorder, anxiety and depression as well as other comorbidities who presented with altered mental status.  Patient was found facing down the smoke shop unresponsive.  EMS was called and patient was found to have pinpoint pupils and agonal breathing and an O2 saturation of 45%.  He was placed on a nonrebreather and O2 saturations improved after about 20 minutes.  Patient was given 2 doses of Narcan during this time.  He admitted to snorting heroin yesterday afternoon and felt "that he is about to get heatstroke" and did not remember anything that happened ever since.  His mother was at bedside during the admission and states that he has poorly controlled schizophrenia and has been noncompliant with his psychiatric medications.  Per patient's mother he is at several times claimed that he is wanted to end his life.  Because of his unresponsive status he was brought in and placed on BiPAP.  Prior to this he was agitated and pulling out his lines and oxygen.  He was admitted for acute toxic encephalopathy in the setting of drug overdose from heroin and also admitted for aspiration pneumonia as well as acute hypoxic respiratory failure in setting of aspiration pneumonia.  Assessment & Plan:   Active Problems:   Aspiration pneumonia (HCC)   Hypoxia  Acute Respiratory Failure with Hypoxia in the setting of Aspiration Pneumonia -He was placed in the progressive care unit -Placed on aspiration precautions and started on IV Zosyn -Continue with DuoNebs every 6 hours as needed -Continue with guaifenesin 12 mg p.o. twice daily -will try and wean off oxygen as tolerated -Continue to monitor respiratory status carefully and repeat chest x-ray showed "Borderline  cardiomegaly.  No pulmonary venous congestion. Lung volumes with persistent bibasilar atelectasis/infiltrates."  Acute Heroin Overdose -Continue with IV fluid hydration -started on clonidine withdrawal protocol -Patient was placed on a CIWA protocol given concern for possible alcohol withdrawal and concomitant Abuse; continue with folic acid, multivitamin and thiamine;  -Continue to monitor   Acute encephalopathy in the setting of toxic encephalopathy due to overdose as well as schizophrenia -Continues to be lethargic and persistently encephalopathic, having periods of agitaiton -Seen by psychiatry and started on Ativan and Haldol -Continue with naloxone 0.4 mg IV as needed -Delirium precautions    Sepsis secondary to aspiration pneumonia, present on admission -on presentation had a WBC of 22.8 and lactic acid level 4.4 and patient was tachypneic and tachycardic with a source of infection with aspiration pneumonia; WBC is now 11.8 -Aspiration precaution placed on IV Zosyn -Blood cultures x2 show no growth to date  -speech therapy following -still npo, largely because of lethargy  Schizophrenia -Poorly controlled And he has been noncompliant with his medications  -seen by psychiatry and started on scheduled haldol, ativan and cogentin -seroquel discontinued -monitor QTc -using prn ativan for agitaiton -outpatient dose of prolixin has also been ordered for friday -Safety Sitter    Hyponatremia -Mild and improved.  Patient's sodium went from 131 is now 137 yesterday and today is 139 -Continue monitor   Hyperkalemia -Mild at 5.8 and improved after patient was given Lasix.  Potassium is now 3.3 -replace  Diabetes mellitus type 2 with hyperglycemia -Patient's hemoglobin A1c was 8.8 -blood sugars have been stable -Currently the patient is  on a moderate NovoLog sliding scale insulin AC  Obesity -Complicates overall prognosis and care -Estimated body mass index is 32.08 kg/m  as calculated from the following:   Height as of 11/17/19: 5\' 11"  (1.803 m).   Weight as of 11/17/19: 104.3 kg. -Weight Loss and Dietary Counseling given    DVT prophylaxis: Enoxaparin 40 g subcu every 24 Code Status: FULL CODE Family Communication: No family present at bedside  Disposition Plan: Pending further clinical improvement and evaluation with psychiatry  Status is: Inpatient  Remains inpatient appropriate because:Unsafe d/c plan, IV treatments appropriate due to intensity of illness or inability to take PO, and Inpatient level of care appropriate due to severity of illness  Dispo: The patient is from: Home              Anticipated d/c is to:  TBD              Patient currently is medically stable to d/c.   Difficult to place patient No  Consultants:  Psychiatry  Procedures: None  Antimicrobials:  Anti-infectives (From admission, onward)    Start     Dose/Rate Route Frequency Ordered Stop   12/02/20 2245  Ampicillin-Sulbactam (UNASYN) 3 g in sodium chloride 0.9 % 100 mL IVPB        3 g 200 mL/hr over 30 Minutes Intravenous Every 6 hours 12/02/20 2243     12/02/20 1900  Ampicillin-Sulbactam (UNASYN) 3 g in sodium chloride 0.9 % 100 mL IVPB  Status:  Discontinued        3 g 200 mL/hr over 30 Minutes Intravenous Every 6 hours 12/02/20 1848 12/02/20 1848   12/02/20 1715  ampicillin-sulbactam (UNASYN) 1.5 g in sodium chloride 0.9 % 100 mL IVPB  Status:  Discontinued        1.5 g 200 mL/hr over 30 Minutes Intravenous  Once 12/02/20 1707 12/02/20 1848        Subjective: Staff reports continued concerns for agitation/combative throughout the day. He failed swallow eval for drinking water earlier today  Objective: Vitals:   12/06/20 1001 12/06/20 1221 12/06/20 1222 12/06/20 1558  BP: (!) 143/98  (!) 139/91 (!) 154/86  Pulse: 93  93 94  Resp: 18  17 15   Temp: 97.6 F (36.4 C) 97.9 F (36.6 C) 97.9 F (36.6 C) 98.1 F (36.7 C)  TempSrc: Axillary Axillary Axillary  Axillary  SpO2: 96%  97% 100%    Intake/Output Summary (Last 24 hours) at 12/06/2020 1747 Last data filed at 12/06/2020 0930 Gross per 24 hour  Intake 0 ml  Output 750 ml  Net -750 ml   There were no vitals filed for this visit.  Examination: Physical Exam:  General exam: somnolent, speech difficult to comprehend Respiratory system: Clear to auscultation. Respiratory effort normal. Cardiovascular system:RRR. No murmurs, rubs, gallops. Gastrointestinal system: Abdomen is nondistended, soft and nontender. No organomegaly or masses felt. Normal bowel sounds heard. Central nervous system: No focal neurological deficits. Extremities: No C/C/E, +pedal pulses Skin: No rashes, lesions or ulcers Psychiatry: somnolent   Data Reviewed: I have personally reviewed following labs and imaging studies  CBC: Recent Labs  Lab 12/02/20 1523 12/02/20 1833 12/02/20 2030 12/03/20 0030 12/04/20 0020 12/05/20 0042 12/06/20 0431  WBC 15.6*  --  22.8* 25.8* 11.0* 11.8* 8.8  NEUTROABS 10.3*  --   --  23.6*  --  7.6 5.2  HGB 16.2   < > 16.5 16.1 13.4 14.2 13.5  HCT 46.1   < > 48.8  46.5 40.3 41.2 37.3*  MCV 85.2  --  88.4 86.0 89.0 86.2 83.6  PLT 366  --  305 338 223 252 283   < > = values in this interval not displayed.   Basic Metabolic Panel: Recent Labs  Lab 12/02/20 2030 12/03/20 0418 12/04/20 0020 12/05/20 0042 12/06/20 0431  NA 135 131* 137 135 139  K 4.4 5.8* 4.1 3.6 3.3*  CL 96* 99 98 97* 101  CO2 32 30  GLUCOSE 299* 280* 156* 116* 115*  BUN CREATININE 1.19 1.22 0.97 0.81 0.90  CALCIUM 9.1 8.4* 8.8* 9.0 9.0  MG 1.9  --   --  1.9 2.0  PHOS 6.1*  --   --  3.1 3.4   GFR: CrCl cannot be calculated (Unknown ideal weight.). Liver Function Tests: Recent Labs  Lab 12/02/20 1523 12/02/20 2030 12/05/20 0042 12/06/20 0431  AST ALT 33 35 24 25  ALKPHOS 86 90 69 62  BILITOT 0.4 0.6 1.2 1.0  PROT 7.0 7.7 6.9 6.2*  ALBUMIN 3.6 4.1 3.3* 3.0*    No results for input(s): LIPASE, AMYLASE in the last 168 hours. No results for input(s): AMMONIA in the last 168 hours. Coagulation Profile: No results for input(s): INR, PROTIME in the last 168 hours. Cardiac Enzymes: No results for input(s): CKTOTAL, CKMB, CKMBINDEX, TROPONINI in the last 168 hours. BNP (last 3 results) No results for input(s): PROBNP in the last 8760 hours. HbA1C: No results for input(s): HGBA1C in the last 72 hours. CBG: Recent Labs  Lab 12/05/20 1608 12/05/20 2123 12/06/20 0603 12/06/20 1129 12/06/20 1553  GLUCAP 124* 131* 93 126* 108*   Lipid Profile: No results for input(s): CHOL, HDL, LDLCALC, TRIG, CHOLHDL, LDLDIRECT in the last 72 hours. Thyroid Function Tests: No results for input(s): TSH, T4TOTAL, FREET4, T3FREE, THYROIDAB in the last 72 hours. Anemia Panel: No results for input(s): VITAMINB12, FOLATE, FERRITIN, TIBC, IRON, RETICCTPCT in the last 72 hours. Sepsis Labs: Recent Labs  Lab 12/02/20 1705 12/02/20 2030  LATICACIDVEN 3.1* 4.4*    Recent Results (from the past 240 hour(s))  Resp Panel by RT-PCR (Flu A&B, Covid) Nasopharyngeal Swab     Status: None   Collection Time: 12/02/20  3:43 PM   Specimen: Nasopharyngeal Swab; Nasopharyngeal(NP) swabs in vial transport medium  Result Value Ref Range Status   SARS Coronavirus 2 by RT PCR NEGATIVE NEGATIVE Final    Comment: (NOTE) SARS-CoV-2 target nucleic acids are NOT DETECTED.  The SARS-CoV-2 RNA is generally detectable in upper respiratory specimens during the acute phase of infection. The lowest concentration of SARS-CoV-2 viral copies this assay can detect is 138 copies/mL. A negative result does not preclude SARS-Cov-2 infection and should not be used as the sole basis for treatment or other patient management decisions. A negative result may occur with  improper specimen collection/handling, submission of specimen other than nasopharyngeal swab, presence of viral mutation(s)  within the areas targeted by this assay, and inadequate number of viral copies(<138 copies/mL). A negative result must be combined with clinical observations, patient history, and epidemiological information. The expected result is Negative.  Fact Sheet for Patients:  BloggerCourse.com  Fact Sheet for Healthcare Providers:  SeriousBroker.it  This test is no t yet approved or cleared by the [place] FDA and  has been authorized for detection and/or diagnosis of SARS-CoV-2 by FDA under an Emergency Use Authorization (EUA). This EUA will remain  in  effect (meaning this test can be used) for the duration of the COVID-19 declaration under Section 564(b)(1) of the Act, 21 U.S.C.section 360bbb-3(b)(1), unless the authorization is terminated  or revoked sooner.       Influenza A by PCR NEGATIVE NEGATIVE Final   Influenza B by PCR NEGATIVE NEGATIVE Final    Comment: (NOTE) The Xpert Xpress SARS-CoV-2/FLU/RSV plus assay is intended as an aid in the diagnosis of influenza from Nasopharyngeal swab specimens and should not be used as a sole basis for treatment. Nasal washings and aspirates are unacceptable for Xpert Xpress SARS-CoV-2/FLU/RSV testing.  Fact Sheet for Patients: BloggerCourse.com  Fact Sheet for Healthcare Providers: SeriousBroker.it  This test is not yet approved or cleared by the [place] FDA and has been authorized for detection and/or diagnosis of SARS-CoV-2 by FDA under an Emergency Use Authorization (EUA). This EUA will remain in effect (meaning this test can be used) for the duration of the COVID-19 declaration under Section 564(b)(1) of the Act, 21 U.S.C. section 360bbb-3(b)(1), unless the authorization is terminated or revoked.  Performed at [facility], [address], [place], [place] [address]   Blood culture (routine x 2)     Status: None  (Preliminary result)   Collection Time: 12/02/20  5:27 PM   Specimen: BLOOD RIGHT HAND  Result Value Ref Range Status   Specimen Description BLOOD RIGHT HAND  Final   Special Requests   Final    BOTTLES DRAWN AEROBIC AND ANAEROBIC Blood Culture results may not be optimal due to an inadequate volume of blood received in culture bottles   Culture   Final    NO GROWTH 4 DAYS Performed at [facility], [address], [place], [place] [address]    Report Status PENDING  Incomplete  Blood culture (routine x 2)     Status: None (Preliminary result)   Collection Time: 12/02/20  6:27 PM   Specimen: BLOOD RIGHT WRIST  Result Value Ref Range Status   Specimen Description BLOOD RIGHT WRIST  Final   Special Requests   Final    BOTTLES DRAWN AEROBIC ONLY Blood Culture results may not be optimal due to an inadequate volume of blood received in culture bottles   Culture   Final    NO GROWTH 4 DAYS Performed at [facility], [address] [place], [place] [address]    Report Status PENDING  Incomplete  MRSA Next Gen by PCR, Nasal     Status: None   Collection Time: 12/02/20 11:37 PM   Specimen: Nasal Mucosa; Nasal Swab  Result Value Ref Range Status   MRSA by PCR Next Gen NOT DETECTED NOT DETECTED Final    Comment: (NOTE) The GeneXpert MRSA Assay (FDA approved for NASAL specimens only), is one component of a comprehensive MRSA colonization surveillance program. It is not intended to diagnose MRSA infection nor to guide or monitor treatment for MRSA infections. Test performance is not FDA approved in patients less than 81 years old. Performed at [facility], [address] [place], [place] [address]     RN Pressure Injury Documentation:     Estimated body mass index is 32.08 kg/m as calculated from the following:   Height as of 11/17/19: 5\' 11"  (1.803 m).   Weight as of 11/17/19: 104.3 kg.  Malnutrition Type:   Malnutrition Characteristics:   Nutrition  Interventions:    Radiology Studies: DG CHEST PORT 1 VIEW  Result Date: 12/06/2020 CLINICAL DATA:  Shortness of breath.  Drug overdose. EXAM: PORTABLE CHEST 1 VIEW COMPARISON:  12/05/2020. FINDINGS: Mediastinum hilar structures are unremarkable. Stable borderline cardiomegaly. Low lung volumes with bibasilar atelectasis/infiltrates again noted. Slight improvement in aeration in the lung bases noted on today's exam. No pleural effusion or pneumothorax. IMPRESSION: 1.  Borderline cardiomegaly again noted. 2. Low lung volumes with persistent bibasilar atelectasis/infiltrates again noted. Slight improvement in aeration in the lung bases noted on today's exam. Electronically Signed   By: Maisie Fus  Register   On: 12/06/2020 07:14   DG CHEST PORT 1 VIEW  Result Date: 12/05/2020 CLINICAL DATA:  Shortness of breath.  Drug overdose. EXAM: PORTABLE CHEST 1 VIEW COMPARISON:  12/03/2020. FINDINGS: Borderline cardiomegaly. No pulmonary venous congestion. Low lung volumes with persistent bibasilar atelectasis/infiltrates. No pleural effusion or pneumothorax. IMPRESSION: 1.  Borderline cardiomegaly.  No pulmonary venous congestion. 2.  Lung volumes with persistent bibasilar atelectasis/infiltrates. Electronically Signed   By: Maisie Fus  Register   On: 12/05/2020 06:55    Scheduled Meds:  benztropine mesylate  1 mg Intravenous QHS   enoxaparin (LOVENOX) injection  40 mg Subcutaneous Q24H   [START ON 12/08/2020] fluPHENAZine decanoate  50 mg Intramuscular Once   folic acid  1 mg Oral Daily   guaiFENesin  1,200 mg Oral BID   haloperidol lactate  10 mg Intravenous Once   [START ON 12/07/2020] haloperidol lactate  10 mg Intravenous q1800   [START ON 12/07/2020] haloperidol lactate  5 mg Intravenous BID   insulin aspart  0-15 Units Subcutaneous TID WC   LORazepam  1 mg Intravenous BID   multivitamin with minerals  1 tablet Oral Daily   thiamine  100 mg Oral Daily   Or   thiamine  100 mg Intravenous Daily   Continuous  Infusions:  ampicillin-sulbactam (UNASYN) IV 3 g (12/06/20 1302)   lactated ringers Stopped (12/05/20 0918)    LOS: 4 days   Erick Blinks, MD [facility] PAGER is on AMION  If 7PM-7AM, please contact night-coverage [email or web address]

## 2020-12-06 NOTE — Evaluation (Signed)
Clinical/Bedside Swallow Evaluation Patient Details  Name: Hari Casaus MRN: [id number] Date of Birth: 04/29/1986  Today's Date: 12/06/2020 Time: SLP Start Time (ACUTE ONLY): 1350 SLP Stop Time (ACUTE ONLY): 1400 SLP Time Calculation (min) (ACUTE ONLY): 10 min  Past Medical History:  Past Medical History:  Diagnosis Date   Anxiety    Depression    Hypertension    Schizoaffective disorder (HCC)    Schizophrenia (HCC)    Past Surgical History:  Past Surgical History:  Procedure Laterality Date   BACK SURGERY     HPI:  The patient is an obese 35 year old male with a past medical history significant for but not limited to polysubstance abuse, schizoaffective disorder, anxiety and depression as well as other comorbidities who presented with altered mental status. Found to have aspiration pna.   Assessment / Plan / Recommendation Clinical Impression  Pt demonstrates significant lethargy. He was able to arouse sufficiently to follow some commands. Needed cues to accept bites of ice which he toelrated well. Also tolerated puree without difficulty though sips of water result in coughing. Expect pt to resume a diet tomorrow when more alert, in the meantime, if he needs meds, they can be given whole in puree. Pt can also have ice if he is asking for water. Will f/u tomorrow for upgrade if pt more alert. SLP Visit Diagnosis: Dysphagia, oropharyngeal phase (R13.12)    Aspiration Risk       Diet Recommendation NPO except meds   Medication Administration: Whole meds with puree    Other  Recommendations     Follow up Recommendations None      Frequency and Duration            Prognosis Prognosis for Safe Diet Advancement: Good      Swallow Study   General HPI: The patient is an obese 35 year old male with a past medical history significant for but not limited to polysubstance abuse, schizoaffective disorder, anxiety and depression as well as other comorbidities who presented with  altered mental status. Found to have aspiration pna. Type of Study: Bedside Swallow Evaluation Respiratory Status: Nasal cannula History of Recent Intubation: No Behavior/Cognition: Lethargic/Drowsy Oral Cavity Assessment: Dry Oral Care Completed by SLP: No Oral Cavity - Dentition: Adequate natural dentition Vision: Impaired for self-feeding Self-Feeding Abilities: Total assist Patient Positioning: Upright in bed Baseline Vocal Quality: Normal Volitional Cough: Cognitively unable to elicit Volitional Swallow: Unable to elicit    Oral/Motor/Sensory Function Overall Oral Motor/Sensory Function: Generalized oral weakness   Ice Chips Ice chips: Within functional limits   Thin Liquid Thin Liquid: Impaired Presentation: Straw Pharyngeal  Phase Impairments: Cough - Immediate    Nectar Thick Nectar Thick Liquid: Not tested   Honey Thick Honey Thick Liquid: Not tested   Puree Puree: Within functional limits   Solid     Solid: Not tested     Harlon Ditty, MA CCC-SLP  Acute Rehabilitation Services Pager [phone] Office [phone]  Claudine Mouton 12/06/2020,2:09 PM

## 2020-12-06 NOTE — Plan of Care (Signed)
  Problem: Education: Goal: Knowledge of General Education information will improve Description: Including pain rating scale, medication(s)/side effects and non-pharmacologic comfort measures Outcome: Not Progressing   Problem: Health Behavior/Discharge Planning: Goal: Ability to manage health-related needs will improve Outcome: Not Progressing   Problem: Clinical Measurements: Goal: Ability to maintain clinical measurements within normal limits will improve Outcome: Not Progressing Goal: Diagnostic test results will improve Outcome: Not Progressing Goal: Respiratory complications will improve Outcome: Not Progressing Goal: Cardiovascular complication will be avoided Outcome: Not Progressing   Problem: Activity: Goal: Risk for activity intolerance will decrease Outcome: Not Progressing   Problem: Nutrition: Goal: Adequate nutrition will be maintained Outcome: Not Progressing   Problem: Coping: Goal: Level of anxiety will decrease Outcome: Not Progressing   Problem: Safety: Goal: Ability to remain free from injury will improve Outcome: Not Progressing   Problem: Skin Integrity: Goal: Risk for impaired skin integrity will decrease Outcome: Not Progressing

## 2020-12-07 DIAGNOSIS — T50904A Poisoning by unspecified drugs, medicaments and biological substances, undetermined, initial encounter: Secondary | ICD-10-CM | POA: Diagnosis not present

## 2020-12-07 DIAGNOSIS — F209 Schizophrenia, unspecified: Secondary | ICD-10-CM | POA: Diagnosis not present

## 2020-12-07 DIAGNOSIS — T40604A Poisoning by unspecified narcotics, undetermined, initial encounter: Secondary | ICD-10-CM | POA: Diagnosis not present

## 2020-12-07 DIAGNOSIS — J690 Pneumonitis due to inhalation of food and vomit: Secondary | ICD-10-CM | POA: Diagnosis not present

## 2020-12-07 LAB — CULTURE, BLOOD (ROUTINE X 2)
Culture: NO GROWTH
Culture: NO GROWTH

## 2020-12-07 LAB — BASIC METABOLIC PANEL
Anion gap: 11 (ref 5–15)
BUN: 11 mg/dL (ref 6–20)
CO2: 23 mmol/L (ref 22–32)
Calcium: 8.8 mg/dL — ABNORMAL LOW (ref 8.9–10.3)
Chloride: 104 mmol/L (ref 98–111)
Creatinine, Ser: 0.82 mg/dL (ref 0.61–1.24)
GFR, Estimated: >60 mL/min (ref >60)
Glucose, Bld: 106 mg/dL — ABNORMAL HIGH (ref 70–99)
Potassium: 3.4 mmol/L — ABNORMAL LOW (ref 3.5–5.1)
Sodium: 138 mmol/L (ref 135–145)

## 2020-12-07 LAB — GLUCOSE, CAPILLARY
Glucose-Capillary: 126 mg/dL — ABNORMAL HIGH (ref 70–99)
Glucose-Capillary: 183 mg/dL — ABNORMAL HIGH (ref 70–99)
Glucose-Capillary: 150 mg/dL — ABNORMAL HIGH (ref 70–99)
Glucose-Capillary: 177 mg/dL — ABNORMAL HIGH (ref 70–99)

## 2020-12-07 LAB — MAGNESIUM: Magnesium: 1.8 mg/dL (ref 1.7–2.4)

## 2020-12-07 MED ORDER — OLANZAPINE 5 MG PO TBDP
10.0000 mg | ORAL_TABLET | Freq: Every day | ORAL | Status: DC
  Administered 2020-12-07: 10 mg via ORAL
  Filled 2020-12-07: qty 2

## 2020-12-07 MED ORDER — OLANZAPINE 5 MG PO TBDP
5.0000 mg | ORAL_TABLET | Freq: Two times a day (BID) | ORAL | Status: DC
  Administered 2020-12-07 – 2020-12-08 (×2): 5 mg via ORAL
  Filled 2020-12-07 (×3): qty 1

## 2020-12-07 MED ORDER — NICOTINE 21 MG/24HR TD PT24
21.0000 mg | MEDICATED_PATCH | Freq: Every day | TRANSDERMAL | Status: DC
  Administered 2020-12-07 – 2020-12-08 (×2): 21 mg via TRANSDERMAL
  Filled 2020-12-07 (×2): qty 1

## 2020-12-07 MED ORDER — LORAZEPAM 2 MG/ML IJ SOLN
1.0000 mg | INTRAMUSCULAR | Status: DC | PRN
  Administered 2020-12-07 (×2): 1 mg via INTRAVENOUS
  Filled 2020-12-07 (×2): qty 1

## 2020-12-07 NOTE — Progress Notes (Signed)
Patient restless throughout the night. 1:1 sitter at bedside. Patient agitated at times and insisted on going to the bathroom although he had already peed all over the bed couple of times this night. Patient removed the IV two times and kept taking off the leads and pulse oximeter despite mitts on. Patient wanted cereal this morning. This RN informed the patient about patient being NPO and could eat apple sauce and ice chips only. Patient redirected to the bed and provided with some ice chips. IV team consulted to get another IV.

## 2020-12-07 NOTE — Progress Notes (Signed)
PROGRESS NOTE    [name]  RUE:[id number] DOB: 07-03-1985 DOA: 12/02/2020 PCP: Pcp, No   Brief Narrative:  The patient is an obese 35 year old male with a past medical history significant for but not limited to polysubstance abuse, schizoaffective disorder, anxiety and depression as well as other comorbidities who presented with altered mental status.  Patient was found facing down the smoke shop unresponsive.  EMS was called and patient was found to have pinpoint pupils and agonal breathing and an O2 saturation of 45%.  He was placed on a nonrebreather and O2 saturations improved after about 20 minutes.  Patient was given 2 doses of Narcan during this time.  He admitted to snorting heroin yesterday afternoon and felt "that he is about to get heatstroke" and did not remember anything that happened ever since.  His mother was at bedside during the admission and states that he has poorly controlled schizophrenia and has been noncompliant with his psychiatric medications.  Per patient's mother he is at several times claimed that he is wanted to end his life.  Because of his unresponsive status he was brought in and placed on BiPAP.  Prior to this he was agitated and pulling out his lines and oxygen.  He was admitted for acute toxic encephalopathy in the setting of drug overdose from heroin and also admitted for aspiration pneumonia as well as acute hypoxic respiratory failure in setting of aspiration pneumonia.  Assessment & Plan:   Active Problems:   Aspiration pneumonia (HCC)   Hypoxia  Acute Respiratory Failure with Hypoxia in the setting of Aspiration Pneumonia -He was placed in the progressive care unit -Placed on aspiration precautions and started on IV Zosyn -Continue with DuoNebs every 6 hours as needed -Continue with guaifenesin 12 mg p.o. twice daily -He is currently weaned off of oxygen and breathing comfortably on room air -Continue to monitor respiratory status carefully and  repeat chest x-ray showed "Borderline cardiomegaly.  No pulmonary venous congestion. Lung volumes with persistent bibasilar atelectasis/infiltrates."  Acute Heroin Overdose -Continue with IV fluid hydration -started on clonidine withdrawal protocol -Patient was placed on a CIWA protocol given concern for possible alcohol withdrawal and concomitant Abuse; continue with folic acid, multivitamin and thiamine;  -Continue to monitor   Acute encephalopathy in the setting of toxic encephalopathy due to overdose as well as schizophrenia -Remains confused, although more calm and appears to be more awake today. -Seen by psychiatry and started on Ativan and Zyprexa -Continue with naloxone 0.4 mg IV as needed -Delirium precautions    Sepsis secondary to aspiration pneumonia, present on admission -on presentation had a WBC of 22.8 and lactic acid level 4.4 and patient was tachypneic and tachycardic with a source of infection with aspiration pneumonia; WBC is now 11.8 -Aspiration precaution placed on IV Zosyn -Blood cultures x2 show no growth to date  -speech therapy following -still npo, largely because of lethargy -Hopeful to advance diet today by speech since his mental status appears to be improving  Schizophrenia -Poorly controlled And he has been noncompliant with his medications  -Psychiatry following -Haldol changed to Zyprexa, continued on as needed Ativan -He will receive his Prolixin shot tomorrow -We will plan on restarting Seroquel once p.o. intake cleared by speech therapy -EKG ordered to monitor QTC -Safety Sitter    Hyponatremia -Mild and improved.  Patient's sodium went from 131 is now 137 yesterday and today is 138 -Continue monitor   Hyperkalemia/hypokalemia -Mild at 5.8 and improved after patient was given Lasix.  Potassium is now 3.3 -replace  Diabetes mellitus type 2 with hyperglycemia -Patient's hemoglobin A1c was 8.8 -blood sugars have been stable -Currently  the patient is on a moderate NovoLog sliding scale insulin AC -Can consider starting on metformin on discharge  Obesity -Complicates overall prognosis and care -Estimated body mass index is 32.08 kg/m as calculated from the following:   Height as of 11/17/19:  (1.803 m).   Weight as of 11/17/19: 104.3 kg. -Weight Loss and Dietary Counseling given    DVT prophylaxis: Enoxaparin 40 g subcu every 24 Code Status: FULL CODE Family Communication: No family present at bedside  Disposition Plan: Pending further clinical improvement and evaluation with psychiatry  Status is: Inpatient  Remains inpatient appropriate because:Unsafe d/c plan, IV treatments appropriate due to intensity of illness or inability to take PO, and Inpatient level of care appropriate due to severity of illness  Dispo: The patient is from: Home              Anticipated d/c is to:  TBD              Patient currently is medically stable to d/c.   Difficult to place patient No  Consultants:  Psychiatry  Procedures: None  Antimicrobials:  Anti-infectives (From admission, onward)    Start     Dose/Rate Route Frequency Ordered Stop   12/02/20 2245  Ampicillin-Sulbactam (UNASYN) 3 g in sodium chloride 0.9 % 100 mL IVPB        3 g 200 mL/hr over 30 Minutes Intravenous Every 6 hours 12/02/20 2243     12/02/20 1900  Ampicillin-Sulbactam (UNASYN) 3 g in sodium chloride 0.9 % 100 mL IVPB  Status:  Discontinued        3 g 200 mL/hr over 30 Minutes Intravenous Every 6 hours 12/02/20 1848 12/02/20 1848   12/02/20 1715  ampicillin-sulbactam (UNASYN) 1.5 g in sodium chloride 0.9 % 100 mL IVPB  Status:  Discontinued        1.5 g 200 mL/hr over 30 Minutes Intravenous  Once 12/02/20 1707 12/02/20 1848        Subjective: Patient is confused, but more calm today.  Reports having a cough.  Objective: Vitals:   12/06/20 1900 12/06/20 2305 12/07/20 0146 12/07/20 0319  BP: (!) 155/97 128/83  124/76  Pulse: 99 97 88 94   Resp: 20 (!) 22  (!) 22  Temp: 98.8 F (37.1 C) 98.8 F (37.1 C)  98.7 F (37.1 C)  TempSrc: Oral Axillary  Axillary  SpO2: 94% 96%  95%    Intake/Output Summary (Last 24 hours) at 12/07/2020 1142 Last data filed at 12/07/2020 0437 Gross per 24 hour  Intake 647.85 ml  Output --  Net 647.85 ml   There were no vitals filed for this visit.  Examination: Physical Exam:  General exam: Alert, awake, no distress Respiratory system: Clear to auscultation. Respiratory effort normal. Cardiovascular system:RRR. No murmurs, rubs, gallops. Gastrointestinal system: Abdomen is nondistended, soft and nontender. No organomegaly or masses felt. Normal bowel sounds heard. Central nervous system: No focal neurological deficits. Extremities: No C/C/E, +pedal pulses Skin: No rashes, lesions or ulcers Psychiatry: He appears calm today, engages in conversation, still confused, does not realize he is in [place]    Data Reviewed: I have personally reviewed following labs and imaging studies  CBC: Recent Labs  Lab 12/02/20 1523 12/02/20 1833 12/02/20 2030 12/03/20 0030 12/04/20 0020 12/05/20 0042 12/06/20 0431  WBC 15.6*  --  22.8*  25.8* 11.0* 11.8* 8.8  NEUTROABS 10.3*  --   --  23.6*  --  7.6 5.2  HGB 16.2   < > 16.5 16.1 13.4 14.2 13.5  HCT 46.1   < > 48.8 46.5 40.3 41.2 37.3*  MCV 85.2  --  88.4 86.0 89.0 86.2 83.6  PLT 366  --  305 338 223 252 283   < > = values in this interval not displayed.   Basic Metabolic Panel: Recent Labs  Lab 12/02/20 2030 12/03/20 0418 12/04/20 0020 12/05/20 0042 12/06/20 0431 12/07/20 0427  NA 135 131* 137 135 139 138  K 4.4 5.8* 4.1 3.6 3.3* 3.4*  CL 96* 99 98 97* 101 104  CO2 28 25 31  32 30 23  GLUCOSE 299* 280* 156* 116* 115* 106*  BUN 9 15 10 6 7 11   CREATININE 1.19 1.22 0.97 0.81 0.90 0.82  CALCIUM 9.1 8.4* 8.8* 9.0 9.0 8.8*  MG 1.9  --   --  1.9 2.0 1.8  PHOS 6.1*  --   --  3.1 3.4  --    GFR: CrCl cannot be calculated  (Unknown ideal weight.). Liver Function Tests: Recent Labs  Lab 12/02/20 1523 12/02/20 2030 12/05/20 0042 12/06/20 0431  AST 28 28 19 22   ALT 33 35 24 25  ALKPHOS 86 90 69 62  BILITOT 0.4 0.6 1.2 1.0  PROT 7.0 7.7 6.9 6.2*  ALBUMIN 3.6 4.1 3.3* 3.0*   No results for input(s): LIPASE, AMYLASE in the last 168 hours. No results for input(s): AMMONIA in the last 168 hours. Coagulation Profile: No results for input(s): INR, PROTIME in the last 168 hours. Cardiac Enzymes: No results for input(s): CKTOTAL, CKMB, CKMBINDEX, TROPONINI in the last 168 hours. BNP (last 3 results) No results for input(s): PROBNP in the last 8760 hours. HbA1C: No results for input(s): HGBA1C in the last 72 hours. CBG: Recent Labs  Lab 12/06/20 1129 12/06/20 1553 12/06/20 2201 12/07/20 0600 12/07/20 1101  GLUCAP 126* 108* 126* 126* 183*   Lipid Profile: No results for input(s): CHOL, HDL, LDLCALC, TRIG, CHOLHDL, LDLDIRECT in the last 72 hours. Thyroid Function Tests: No results for input(s): TSH, T4TOTAL, FREET4, T3FREE, THYROIDAB in the last 72 hours. Anemia Panel: No results for input(s): VITAMINB12, FOLATE, FERRITIN, TIBC, IRON, RETICCTPCT in the last 72 hours. Sepsis Labs: Recent Labs  Lab 12/02/20 1705 12/02/20 2030  LATICACIDVEN 3.1* 4.4*    Recent Results (from the past 240 hour(s))  Resp Panel by RT-PCR (Flu A&B, Covid) Nasopharyngeal Swab     Status: None   Collection Time: 12/02/20  3:43 PM   Specimen: Nasopharyngeal Swab; Nasopharyngeal(NP) swabs in vial transport medium  Result Value Ref Range Status   SARS Coronavirus 2 by RT PCR NEGATIVE NEGATIVE Final    Comment: (NOTE) SARS-CoV-2 target nucleic acids are NOT DETECTED.  The SARS-CoV-2 RNA is generally detectable in upper respiratory specimens during the acute phase of infection. The lowest concentration of SARS-CoV-2 viral copies this assay can detect is 138 copies/mL. A negative result does not preclude  SARS-Cov-2 infection and should not be used as the sole basis for treatment or other patient management decisions. A negative result may occur with  improper specimen collection/handling, submission of specimen other than nasopharyngeal swab, presence of viral mutation(s) within the areas targeted by this assay, and inadequate number of viral copies(<138 copies/mL). A negative result must be combined with clinical observations, patient history, and epidemiological information. The expected result is Negative.  Fact  Sheet for Patients:  BloggerCourse.com  Fact Sheet for Healthcare Providers:  SeriousBroker.it  This test is no t yet approved or cleared by the [place] FDA and  has been authorized for detection and/or diagnosis of SARS-CoV-2 by FDA under an Emergency Use Authorization (EUA). This EUA will remain  in effect (meaning this test can be used) for the duration of the COVID-19 declaration under Section 564(b)(1) of the Act, 21 U.S.C.section 360bbb-3(b)(1), unless the authorization is terminated  or revoked sooner.       Influenza A by PCR NEGATIVE NEGATIVE Final   Influenza B by PCR NEGATIVE NEGATIVE Final    Comment: (NOTE) The Xpert Xpress SARS-CoV-2/FLU/RSV plus assay is intended as an aid in the diagnosis of influenza from Nasopharyngeal swab specimens and should not be used as a sole basis for treatment. Nasal washings and aspirates are unacceptable for Xpert Xpress SARS-CoV-2/FLU/RSV testing.  Fact Sheet for Patients: BloggerCourse.com  Fact Sheet for Healthcare Providers: SeriousBroker.it  This test is not yet approved or cleared by the [place] FDA and has been authorized for detection and/or diagnosis of SARS-CoV-2 by FDA under an Emergency Use Authorization (EUA). This EUA will remain in effect (meaning this test can be used) for the duration of  the COVID-19 declaration under Section 564(b)(1) of the Act, 21 U.S.C. section 360bbb-3(b)(1), unless the authorization is terminated or revoked.  Performed at [facility], [address], [place], [place] [address]   Blood culture (routine x 2)     Status: None (Preliminary result)   Collection Time: 12/02/20  5:27 PM   Specimen: BLOOD RIGHT HAND  Result Value Ref Range Status   Specimen Description BLOOD RIGHT HAND  Final   Special Requests   Final    BOTTLES DRAWN AEROBIC AND ANAEROBIC Blood Culture results may not be optimal due to an inadequate volume of blood received in culture bottles   Culture   Final    NO GROWTH 4 DAYS Performed at [facility], [address] [place], [place] [address]    Report Status PENDING  Incomplete  Blood culture (routine x 2)     Status: None (Preliminary result)   Collection Time: 12/02/20  6:27 PM   Specimen: BLOOD RIGHT WRIST  Result Value Ref Range Status   Specimen Description BLOOD RIGHT WRIST  Final   Special Requests   Final    BOTTLES DRAWN AEROBIC ONLY Blood Culture results may not be optimal due to an inadequate volume of blood received in culture bottles   Culture   Final    NO GROWTH 4 DAYS Performed at [facility], [address] [place], [place] [address]    Report Status PENDING  Incomplete  MRSA Next Gen by PCR, Nasal     Status: None   Collection Time: 12/02/20 11:37 PM   Specimen: Nasal Mucosa; Nasal Swab  Result Value Ref Range Status   MRSA by PCR Next Gen NOT DETECTED NOT DETECTED Final    Comment: (NOTE) The GeneXpert MRSA Assay (FDA approved for NASAL specimens only), is one component of a comprehensive MRSA colonization surveillance program. It is not intended to diagnose MRSA infection nor to guide or monitor treatment for MRSA infections. Test performance is not FDA approved in patients less than 81 years old. Performed at [facility], [address] [place], [place] [address]      RN Pressure Injury Documentation:     Estimated body mass index  is 32.08 kg/m as calculated from the following:   Height as of 11/17/19: 5\' 11"  (1.803 m).   Weight as of 11/17/19: 104.3 kg.  Malnutrition Type:   Malnutrition Characteristics:   Nutrition Interventions:    Radiology Studies: DG CHEST PORT 1 VIEW  Result Date: 12/06/2020 CLINICAL DATA:  Shortness of breath.  Drug overdose. EXAM: PORTABLE CHEST 1 VIEW COMPARISON:  12/05/2020. FINDINGS: Mediastinum hilar structures are unremarkable. Stable borderline cardiomegaly. Low lung volumes with bibasilar atelectasis/infiltrates again noted. Slight improvement in aeration in the lung bases noted on today's exam. No pleural effusion or pneumothorax. IMPRESSION: 1.  Borderline cardiomegaly again noted. 2. Low lung volumes with persistent bibasilar atelectasis/infiltrates again noted. Slight improvement in aeration in the lung bases noted on today's exam. Electronically Signed   By: Maisie Fushomas  Register   On: 12/06/2020 07:14    Scheduled Meds:  benztropine mesylate  1 mg Intravenous QHS   cloNIDine  0.1 mg Oral QID   Followed by   Melene Muller[START ON 12/09/2020] cloNIDine  0.1 mg Oral BH-qamhs   Followed by   Melene Muller[START ON 12/11/2020] cloNIDine  0.1 mg Oral QAC breakfast   enoxaparin (LOVENOX) injection  40 mg Subcutaneous Q24H   [START ON 12/08/2020] fluPHENAZine decanoate  50 mg Intramuscular Once   folic acid  1 mg Oral Daily   guaiFENesin  1,200 mg Oral BID   insulin aspart  0-15 Units Subcutaneous TID WC   multivitamin with minerals  1 tablet Oral Daily   OLANZapine zydis  10 mg Oral Daily   OLANZapine zydis  5 mg Oral BID   thiamine  100 mg Oral Daily   Or   thiamine  100 mg Intravenous Daily   Continuous Infusions:  ampicillin-sulbactam (UNASYN) IV 3 g (12/07/20 1032)   lactated ringers with kcl 100 mL/hr at 12/07/20 0437    LOS: 5 days   Erick BlinksJehanzeb Datron Brakebill, MD [facility] PAGER is on AMION  If 7PM-7AM, please contact  night-coverage [email or web address]

## 2020-12-07 NOTE — Consult Note (Addendum)
Patient seen at bedside this am. Pt still sedated but somewhat more verbal. Per nursing notes, pt has been restless and extremely agitated. Plan to modify medication regimen for agitation to taper off ativan.  Per Dr. Jeannine Kitten (ACT Team psychiatrist), patient is normally only on Prolixin Decanoate. He had recently been prescribed seroquel for anxiety. Pt is regularly benzo-seeking with providers. Per ACT team, pt has low-normal IQ and is functional when he is not taking illicit substances. ACT team agrees with current plan to prolixin injection tomorrow and recommends resuming seroquel once patient is able to tolerate po.  Plan: Patient is a 35 yo male w/ hx of substance induced mood disorder, polysubstance abuse, and schizoaffective disorder presented to [facility] with altered mental status.  Recommendations: -D/c haldol injections as patient's prolixin injection is tomorrow  -Zyprexa Zydis 5 mg 6AM, 5 mg noon, 10 mg 6PM for lability and agitation -Ativan 1 mg injection prn q4h: please give ativan at least 30 min apart from scheduled zyprexa zydis administering within 30 min can lead to respiratory depression -Prolixin decanoate 50 mg tomorrow   Thank you for the consult Park Pope, MD PGY1 Psychiatry Resident

## 2020-12-08 DIAGNOSIS — T50904A Poisoning by unspecified drugs, medicaments and biological substances, undetermined, initial encounter: Secondary | ICD-10-CM | POA: Diagnosis not present

## 2020-12-08 DIAGNOSIS — R0902 Hypoxemia: Secondary | ICD-10-CM | POA: Diagnosis not present

## 2020-12-08 DIAGNOSIS — J690 Pneumonitis due to inhalation of food and vomit: Secondary | ICD-10-CM | POA: Diagnosis not present

## 2020-12-08 DIAGNOSIS — T40604A Poisoning by unspecified narcotics, undetermined, initial encounter: Secondary | ICD-10-CM | POA: Diagnosis not present

## 2020-12-08 LAB — GLUCOSE, CAPILLARY: Glucose-Capillary: 145 mg/dL — ABNORMAL HIGH (ref 70–99)

## 2020-12-08 MED ORDER — SEROQUEL 25 MG PO TABS: 50.0000 mg | ORAL_TABLET | Freq: Two times a day (BID) | ORAL | 1 refills | Status: AC

## 2020-12-08 MED ORDER — BENZTROPINE MESYLATE 1 MG PO TABS: 0.5000 mg | ORAL_TABLET | Freq: Two times a day (BID) | ORAL | 1 refills | Status: AC

## 2020-12-08 MED ORDER — AMOXICILLIN-POT CLAVULANATE 875-125 MG PO TABS: 1.0000 | ORAL_TABLET | Freq: Two times a day (BID) | ORAL | 0 refills | Status: AC

## 2020-12-08 MED ORDER — AMOXICILLIN-POT CLAVULANATE 875-125 MG PO TABS: 1.0000 | ORAL_TABLET | Freq: Two times a day (BID) | ORAL | Status: DC

## 2020-12-08 MED ORDER — METFORMIN HCL 500 MG PO TABS: 500.0000 mg | ORAL_TABLET | Freq: Two times a day (BID) | ORAL | 1 refills | Status: AC

## 2020-12-08 NOTE — Progress Notes (Signed)
07/21-07/22 night summary Patient kept insisting to go out to get some cigarette all night. Patient would put on his shoes and try to get out of room. Patient on his regular clothes. Patient kept removing his leads and did not let us put them on all night. Patient kept asking for water. This RN informed the patient that he cannot drink but can take ice chips and apple sauce. Patient refused ice chips. Per 1:1 sitter, patient drank water from the shower. Atarax *1 and ativan* 1 given. Patient less combative and did not verbally abuse the staffs tonight.

## 2020-12-08 NOTE — Progress Notes (Signed)
Md made aware of pt's  kept pulling out cardiac telemetry leads  and ivf . Continue to monitor.

## 2020-12-08 NOTE — Progress Notes (Signed)
Started on unasyn iv but kept bending arm iv pump kept on beeping. Not cooperative in straightening arm. MD aware. Said med changed   to po.

## 2020-12-08 NOTE — Discharge Summary (Signed)
Physician Discharge Summary  Pal Shell ATF:[id number] DOB: 02/01/1986 DOA: 12/02/2020  PCP: Pcp, No  Admit date: 12/02/2020 Discharge date: 12/08/2020  Admitted From: home Disposition:  home  Recommendations for Outpatient Follow-up:  Follow up with ACT team  Home Health: none Equipment/Devices: none  Discharge Condition: stable CODE STATUS: Full code Diet recommendation: regular  HPI: Per admitting MD, [name] is a 35 y.o. male with medical history significant of polysubstance abuse, schizoaffective disorder, anxiety/depression, presented with altered mentations. Patient was found facing down in the smoke shop unresponsive.  EMS called, EMS arrived found patient pinpoint pupils and agonal breathing and O2 saturation 45%.  Patient was started on nonrebreather, O2 saturation started to improve after about 20 minutes.  During which time, 2 doses of Narcan given. Patient admitted at he was snoring heroine this afternoon and soon felt " about to get heatstroke" then does not remember that had happened since. Mother at bedside reporting patient has had poorly controlled schizophrenia recently, with noncompliant with his psych medications, consulted mother 2 times recently.  And several times claimed that he wanted to end his life.  Hospital Course / Discharge diagnoses: Principal problem Acute Respiratory Failure with Hypoxia in the setting of Aspiration Pneumonia -He was placed in the progressive care unit, was given supplemental oxygen and was placed on antibiotics with IV Zosyn.  He improved, was able to weaned off of oxygen and currently stable on room air.  He will be transitioned to Augmentin to complete a 7-day course upon discharge.  Active problems Acute Heroin Overdose -resolved, psychiatry consulted as below and patient does not meet criteria for psychiatric inpatient admission.  He was counseled for cessation Acute encephalopathy in the setting of toxic encephalopathy  due to overdose as well as schizophrenia -resolved, back to baseline  Sepsis secondary to aspiration pneumonia, present on admission -on presentation had a WBC of 22.8 and lactic acid level 4.4 and patient was tachypneic and tachycardic with a source of infection with aspiration pneumonia. Leukocytosis improved Schizophrenia -Poorly controlled And he has been noncompliant with his medications. Psychiatry following, he was given Prolixin long-acting prior to discharge.  He will be placed on Seroquel 50 mg twice daily per outpatient ACT team (Dr. Jeannine Kitten) as well as Cogentin 0.5 mg twice daily Hyponatremia -resolved Hyperkalemia/hypokalemia -repleted Diabetes mellitus type 2 with hyperglycemia - start metformin on dc Obesity -Complicates overall prognosis and care  Discharge Instructions  Allergies as of 12/08/2020   No Known Allergies      Medication List     STOP taking these medications    methocarbamol 500 MG tablet Commonly known as: ROBAXIN   naproxen 500 MG tablet Commonly known as: NAPROSYN   ondansetron 4 MG disintegrating tablet Commonly known as: ZOFRAN-ODT       TAKE these medications    amoxicillin-clavulanate 875-125 MG tablet Commonly known as: AUGMENTIN Take 1 tablet by mouth every 12 (twelve) hours for 2 days.   benztropine 1 MG tablet Commonly known as: COGENTIN Take 0.5 tablets (0.5 mg total) by mouth 2 (two) times daily.   fluPHENAZine decanoate 25 MG/ML injection Commonly known as: PROLIXIN Inject 2 mLs (50 mg total) into the muscle every 14 (fourteen) days.   metFORMIN 500 MG tablet Commonly known as: Glucophage Take 1 tablet (500 mg total) by mouth 2 (two) times daily with a meal.   SEROquel 25 MG tablet Generic drug: QUEtiapine Take 2 tablets (50 mg total) by mouth 2 (two) times daily. What changed: how  much to take       Consultations: Psychiatry  Procedures/Studies:   CT HEAD WO CONTRAST  Result Date: 12/02/2020 CLINICAL DATA:   Delirium EXAM: CT HEAD WITHOUT CONTRAST TECHNIQUE: Contiguous axial images were obtained from the base of the skull through the vertex without intravenous contrast. COMPARISON:  None. FINDINGS: Brain: Normal anatomic configuration. No abnormal intra or extra-axial mass lesion or fluid collection. No abnormal mass effect or midline shift. No evidence of acute intracranial hemorrhage or infarct. Ventricular size is normal. Cerebellum unremarkable. Vascular: Unremarkable Skull: Intact Sinuses/Orbits: There is opacification of several left ethmoid air cells. Remaining paranasal sinuses are clear. No air-fluid levels. Orbits are unremarkable. Other: Mastoid air cells and middle ear cavities are clear. IMPRESSION: No acute intracranial abnormality. Mild left ethmoid sinus disease. Electronically Signed   By: Helyn Numbers MD   On: 12/02/2020 19:36   DG CHEST PORT 1 VIEW  Result Date: 12/06/2020 CLINICAL DATA:  Shortness of breath.  Drug overdose. EXAM: PORTABLE CHEST 1 VIEW COMPARISON:  12/05/2020. FINDINGS: Mediastinum hilar structures are unremarkable. Stable borderline cardiomegaly. Low lung volumes with bibasilar atelectasis/infiltrates again noted. Slight improvement in aeration in the lung bases noted on today's exam. No pleural effusion or pneumothorax. IMPRESSION: 1.  Borderline cardiomegaly again noted. 2. Low lung volumes with persistent bibasilar atelectasis/infiltrates again noted. Slight improvement in aeration in the lung bases noted on today's exam. Electronically Signed   By: Maisie Fus  Register   On: 12/06/2020 07:14   DG CHEST PORT 1 VIEW  Result Date: 12/05/2020 CLINICAL DATA:  Shortness of breath.  Drug overdose. EXAM: PORTABLE CHEST 1 VIEW COMPARISON:  12/03/2020. FINDINGS: Borderline cardiomegaly. No pulmonary venous congestion. Low lung volumes with persistent bibasilar atelectasis/infiltrates. No pleural effusion or pneumothorax. IMPRESSION: 1.  Borderline cardiomegaly.  No pulmonary venous  congestion. 2.  Lung volumes with persistent bibasilar atelectasis/infiltrates. Electronically Signed   By: Maisie Fus  Register   On: 12/05/2020 06:55   DG Chest Port 1 View  Result Date: 12/03/2020 CLINICAL DATA:  pna EXAM: PORTABLE CHEST 1 VIEW COMPARISON:  12/02/2020 FINDINGS: Normal cardiac silhouette with low lung volumes. Diffuse interstitial linear prominence. No focal consolidation. No pneumothorax. IMPRESSION: 1. No interval change. 2. Linear interstitial opacities suggesting interstitial edema or bronchiolitis. Electronically Signed   By: Genevive Bi M.D.   On: 12/03/2020 08:27   DG Chest Portable 1 View  Result Date: 12/02/2020 CLINICAL DATA:  Wheezing EXAM: PORTABLE CHEST 1 VIEW COMPARISON:  04/09/2020, 06/05/2015 FINDINGS: Slightly diminished lung volumes. Diffuse bilateral interstitial and streaky opacity. No pleural effusion, focal consolidation or pneumothorax. IMPRESSION: Low lung volumes. Mild diffuse interstitial and streaky lung opacity which could be secondary to central airways inflammatory process versus atypical/viral pneumonia. Electronically Signed   By: Jasmine Pang M.D.   On: 12/02/2020 15:31     Subjective: - no chest pain, shortness of breath, no abdominal pain, nausea or vomiting.   Discharge Exam: BP 119/75 (BP Location: Right Arm)   Pulse 81   Temp 97.7 F (36.5 C) (Oral)   Resp 18   SpO2 93%   General: Pt is alert, awake, not in acute distress Cardiovascular: RRR, S1/S2 +, no rubs, no gallops Respiratory: CTA bilaterally, no wheezing, no rhonchi Abdominal: Soft, NT, ND, bowel sounds + Extremities: no edema, no cyanosis    The results of significant diagnostics from this hospitalization (including imaging, microbiology, ancillary and laboratory) are listed below for reference.     Microbiology: Recent Results (from the past 240 hour(s))  Resp Panel by RT-PCR (Flu A&B, Covid) Nasopharyngeal Swab     Status: None   Collection Time: 12/02/20  3:43  PM   Specimen: Nasopharyngeal Swab; Nasopharyngeal(NP) swabs in vial transport medium  Result Value Ref Range Status   SARS Coronavirus 2 by RT PCR NEGATIVE NEGATIVE Final    Comment: (NOTE) SARS-CoV-2 target nucleic acids are NOT DETECTED.  The SARS-CoV-2 RNA is generally detectable in upper respiratory specimens during the acute phase of infection. The lowest concentration of SARS-CoV-2 viral copies this assay can detect is 138 copies/mL. A negative result does not preclude SARS-Cov-2 infection and should not be used as the sole basis for treatment or other patient management decisions. A negative result may occur with  improper specimen collection/handling, submission of specimen other than nasopharyngeal swab, presence of viral mutation(s) within the areas targeted by this assay, and inadequate number of viral copies(<138 copies/mL). A negative result must be combined with clinical observations, patient history, and epidemiological information. The expected result is Negative.  Fact Sheet for Patients:  BloggerCourse.com  Fact Sheet for Healthcare Providers:  SeriousBroker.it  This test is no t yet approved or cleared by the [place] FDA and  has been authorized for detection and/or diagnosis of SARS-CoV-2 by FDA under an Emergency Use Authorization (EUA). This EUA will remain  in effect (meaning this test can be used) for the duration of the COVID-19 declaration under Section 564(b)(1) of the Act, 21 U.S.C.section 360bbb-3(b)(1), unless the authorization is terminated  or revoked sooner.       Influenza A by PCR NEGATIVE NEGATIVE Final   Influenza B by PCR NEGATIVE NEGATIVE Final    Comment: (NOTE) The Xpert Xpress SARS-CoV-2/FLU/RSV plus assay is intended as an aid in the diagnosis of influenza from Nasopharyngeal swab specimens and should not be used as a sole basis for treatment. Nasal washings and aspirates are  unacceptable for Xpert Xpress SARS-CoV-2/FLU/RSV testing.  Fact Sheet for Patients: BloggerCourse.com  Fact Sheet for Healthcare Providers: SeriousBroker.it  This test is not yet approved or cleared by the [place] FDA and has been authorized for detection and/or diagnosis of SARS-CoV-2 by FDA under an Emergency Use Authorization (EUA). This EUA will remain in effect (meaning this test can be used) for the duration of the COVID-19 declaration under Section 564(b)(1) of the Act, 21 U.S.C. section 360bbb-3(b)(1), unless the authorization is terminated or revoked.  Performed at [facility], [address], [place], [place] [address]   Blood culture (routine x 2)     Status: None   Collection Time: 12/02/20  5:27 PM   Specimen: BLOOD RIGHT HAND  Result Value Ref Range Status   Specimen Description BLOOD RIGHT HAND  Final   Special Requests   Final    BOTTLES DRAWN AEROBIC AND ANAEROBIC Blood Culture results may not be optimal due to an inadequate volume of blood received in culture bottles   Culture   Final    NO GROWTH 5 DAYS Performed at [facility], [address], [place], [place] [address]    Report Status 12/07/2020 FINAL  Final  Blood culture (routine x 2)     Status: None   Collection Time: 12/02/20  6:27 PM   Specimen: BLOOD RIGHT WRIST  Result Value Ref Range Status   Specimen Description BLOOD RIGHT WRIST  Final   Special Requests   Final    BOTTLES DRAWN AEROBIC ONLY Blood Culture results may not be optimal due to an  inadequate volume of blood received in culture bottles   Culture   Final    NO GROWTH 5 DAYS Performed at [facility], [address] [place], [place] [address]    Report Status 12/07/2020 FINAL  Final  MRSA Next Gen by PCR, Nasal     Status: None   Collection Time: 12/02/20 11:37 PM   Specimen: Nasal Mucosa; Nasal Swab  Result Value Ref Range Status   MRSA by PCR Next Gen  NOT DETECTED NOT DETECTED Final    Comment: (NOTE) The GeneXpert MRSA Assay (FDA approved for NASAL specimens only), is one component of a comprehensive MRSA colonization surveillance program. It is not intended to diagnose MRSA infection nor to guide or monitor treatment for MRSA infections. Test performance is not FDA approved in patients less than 36 years old. Performed at [facility], [address] [place], [place] [address]      Labs: Basic Metabolic Panel: Recent Labs  Lab 12/02/20 2030 12/03/20 0418 12/04/20 0020 12/05/20 0042 12/06/20 0431 12/07/20 0427  NA 135 131* 137 135 139 138  K 4.4 5.8* 4.1 3.6 3.3* 3.4*  CL 96* 99 98 97* 101 104  CO2 28 25 31  32 30 23  GLUCOSE 299* 280* 156* 116* 115* 106*  BUN 9 15 10 6 7 11   CREATININE 1.19 1.22 0.97 0.81 0.90 0.82  CALCIUM 9.1 8.4* 8.8* 9.0 9.0 8.8*  MG 1.9  --   --  1.9 2.0 1.8  PHOS 6.1*  --   --  3.1 3.4  --    Liver Function Tests: Recent Labs  Lab 12/02/20 1523 12/02/20 2030 12/05/20 0042 12/06/20 0431  AST 28 28 19 22   ALT 33 35 24 25  ALKPHOS 86 90 69 62  BILITOT 0.4 0.6 1.2 1.0  PROT 7.0 7.7 6.9 6.2*  ALBUMIN 3.6 4.1 3.3* 3.0*   CBC: Recent Labs  Lab 12/02/20 1523 12/02/20 1833 12/02/20 2030 12/03/20 0030 12/04/20 0020 12/05/20 0042 12/06/20 0431  WBC 15.6*  --  22.8* 25.8* 11.0* 11.8* 8.8  NEUTROABS 10.3*  --   --  23.6*  --  7.6 5.2  HGB 16.2   < > 16.5 16.1 13.4 14.2 13.5  HCT 46.1   < > 48.8 46.5 40.3 41.2 37.3*  MCV 85.2  --  88.4 86.0 89.0 86.2 83.6  PLT 366  --  305 338 223 252 283   < > = values in this interval not displayed.   CBG: Recent Labs  Lab 12/07/20 0600 12/07/20 1101 12/07/20 1559 12/07/20 2102 12/08/20 0638  GLUCAP 126* 183* 150* 177* 145*   Hgb A1c No results for input(s): HGBA1C in the last 72 hours. Lipid Profile No results for input(s): CHOL, HDL, LDLCALC, TRIG, CHOLHDL, LDLDIRECT in the last 72 hours. Thyroid function studies No results for  input(s): TSH, T4TOTAL, T3FREE, THYROIDAB in the last 72 hours.  Invalid input(s): FREET3 Urinalysis    Component Value Date/Time   COLORURINE YELLOW 06/25/2011 2044   APPEARANCEUR CLEAR 06/25/2011 2044   LABSPEC 1.027 06/25/2011 2044   PHURINE 6.0 06/25/2011 2044   GLUCOSEU NEGATIVE 06/25/2011 2044   HGBUR NEGATIVE 06/25/2011 2044   BILIRUBINUR NEGATIVE 06/25/2011 2044   KETONESUR NEGATIVE 06/25/2011 2044   PROTEINUR NEGATIVE 06/25/2011 2044   UROBILINOGEN 0.2 06/25/2011 2044   NITRITE NEGATIVE 06/25/2011 2044   LEUKOCYTESUR NEGATIVE 06/25/2011 2044    FURTHER DISCHARGE INSTRUCTIONS:   Get Medicines reviewed and adjusted: Please take all  your medications with you for your next visit with your Primary MD   Laboratory/radiological data: Please request your Primary MD to go over all hospital tests and procedure/radiological results at the follow up, please ask your Primary MD to get all Hospital records sent to his/her office.   In some cases, they will be blood work, cultures and biopsy results pending at the time of your discharge. Please request that your primary care M.D. goes through all the records of your hospital data and follows up on these results.   Also Note the following: If you experience worsening of your admission symptoms, develop shortness of breath, life threatening emergency, suicidal or homicidal thoughts you must seek medical attention immediately by calling 911 or calling your MD immediately  if symptoms less severe.   You must read complete instructions/literature along with all the possible adverse reactions/side effects for all the Medicines you take and that have been prescribed to you. Take any new Medicines after you have completely understood and accpet all the possible adverse reactions/side effects.    Do not drive when taking Pain medications or sleeping medications (Benzodaizepines)   Do not take more than prescribed Pain, Sleep and Anxiety  Medications. It is not advisable to combine anxiety,sleep and pain medications without talking with your primary care practitioner   Special Instructions: If you have smoked or chewed Tobacco  in the last 2 yrs please stop smoking, stop any regular Alcohol  and or any Recreational drug use.   Wear Seat belts while driving.   Please note: You were cared for by a hospitalist during your hospital stay. Once you are discharged, your primary care physician will handle any further medical issues. Please note that NO REFILLS for any discharge medications will be authorized once you are discharged, as it is imperative that you return to your primary care physician (or establish a relationship with a primary care physician if you do not have one) for your post hospital discharge needs so that they can reassess your need for medications and monitor your lab values.  Time coordinating discharge: 40 minutes  SIGNED:  Pamella Pert, MD, PhD 12/08/2020, 1:22 PM

## 2020-12-08 NOTE — Progress Notes (Signed)
Discharged home accompanied by mother. Discharge instructions given to his mother, refused to use wheelchair . Walked to the parking by employee and mother.

## 2020-12-08 NOTE — TOC CAGE-AID Note (Signed)
Transition of Care [facility]) - CAGE-AID Screening   Patient Details  Name: [name] MRN: [id number] Date of Birth: [birth year]  [facility] CM/SW Contact:    Ivette Loyal, Theresia Majors Phone Number: 12/08/2020, 2:32 PM   Clinical Narrative: CSW spoke with pt about drug and alcohol usage, pt rambled for a while then stated he drinks "lean", vodka, smokes weed, and uses what he calls rich people drugs. CSW ask pt to described rich people drugs, pt explained it was the type of drugs that could kill you if you use it the wrong way. CSW asked if pt was referring to cocaine or something similar, pt did not confirm but says he does not use it often. Pt also explained that he sometime stays with his girlfriend and uses a substance (alcohol, marijuana, or drug of choice) throughout the day.Pt is aware that has a problem with drugs but is not wanting to quit. CSW provided drug and alcohol resources.   CAGE-AID Screening:    Have You Ever Felt You Ought to Cut Down on Your Drinking or Drug Use?: Yes Have People Annoyed You By Critizing Your Drinking Or Drug Use?: No Have You Felt Bad Or Guilty About Your Drinking Or Drug Use?: Yes Have You Ever Had a Drink or Used Drugs First Thing In The Morning to Steady Your Nerves or to Get Rid of a Hangover?: Yes CAGE-AID Score: 3  Substance Abuse Education Offered: Yes  Substance abuse interventions: Transport planner

## 2020-12-08 NOTE — Progress Notes (Addendum)
Reported by NT that pt refused cbg .

## 2020-12-08 NOTE — Progress Notes (Signed)
Spoke with [employer] ,coming to give prolixin injection today.
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
## 2020-12-08 NOTE — Progress Notes (Signed)
Tama Gander from [facility] came and gave prolixin  to pt. Continue to monitor.

## 2020-12-08 NOTE — Progress Notes (Signed)
Keep pacing inside the room, sitter at bedside. Seen by psych MD. Atarax given, continue to monitor.

## 2020-12-08 NOTE — Progress Notes (Signed)
Minimal bleeding noted  at the mid bridge of the nose, cleansed with saline, band aid applied.

## 2020-12-08 NOTE — Progress Notes (Signed)
Mother called and claimed to come and pick him up at 3:30 pm.

## 2021-02-05 ENCOUNTER — Encounter (HOSPITAL_COMMUNITY): Payer: Self-pay | Admitting: *Deleted

## 2021-02-05 ENCOUNTER — Emergency Department (HOSPITAL_COMMUNITY): Payer: Medicare Other

## 2021-02-05 ENCOUNTER — Other Ambulatory Visit: Payer: Self-pay

## 2021-02-05 ENCOUNTER — Inpatient Hospital Stay (HOSPITAL_COMMUNITY)
Admission: EM | Admit: 2021-02-05 | Discharge: 2021-02-06 | DRG: 917 | Discharge status: Against Medical Advice | Payer: Medicare Other | Attending: Pulmonary Disease | Admitting: Pulmonary Disease

## 2021-02-05 DIAGNOSIS — T401X1A Poisoning by heroin, accidental (unintentional), initial encounter: Secondary | ICD-10-CM

## 2021-02-05 DIAGNOSIS — N289 Disorder of kidney and ureter, unspecified: Secondary | ICD-10-CM

## 2021-02-05 DIAGNOSIS — R0902 Hypoxemia: Secondary | ICD-10-CM

## 2021-02-05 DIAGNOSIS — N179 Acute kidney failure, unspecified: Secondary | ICD-10-CM

## 2021-02-05 DIAGNOSIS — F259 Schizoaffective disorder, unspecified: Principal | ICD-10-CM

## 2021-02-05 DIAGNOSIS — E860 Dehydration: Secondary | ICD-10-CM

## 2021-02-05 DIAGNOSIS — E669 Obesity, unspecified: Secondary | ICD-10-CM

## 2021-02-05 DIAGNOSIS — E1169 Type 2 diabetes mellitus with other specified complication: Secondary | ICD-10-CM

## 2021-02-05 DIAGNOSIS — R4182 Altered mental status, unspecified: Secondary | ICD-10-CM

## 2021-02-05 DIAGNOSIS — J690 Pneumonitis due to inhalation of food and vomit: Secondary | ICD-10-CM

## 2021-02-05 DIAGNOSIS — R9431 Abnormal electrocardiogram [ECG] [EKG]: Secondary | ICD-10-CM

## 2021-02-05 DIAGNOSIS — J9601 Acute respiratory failure with hypoxia: Secondary | ICD-10-CM

## 2021-02-05 DIAGNOSIS — R1110 Vomiting, unspecified: Secondary | ICD-10-CM

## 2021-02-05 DIAGNOSIS — Z4659 Encounter for fitting and adjustment of other gastrointestinal appliance and device: Secondary | ICD-10-CM

## 2021-02-05 DIAGNOSIS — J189 Pneumonia, unspecified organism: Secondary | ICD-10-CM | POA: Diagnosis present

## 2021-02-05 DIAGNOSIS — Z20822 Contact with and (suspected) exposure to covid-19: Secondary | ICD-10-CM | POA: Diagnosis present

## 2021-02-05 DIAGNOSIS — F32A Depression, unspecified: Secondary | ICD-10-CM | POA: Diagnosis present

## 2021-02-05 DIAGNOSIS — Z7984 Long term (current) use of oral hypoglycemic drugs: Secondary | ICD-10-CM | POA: Diagnosis not present

## 2021-02-05 DIAGNOSIS — Z28310 Unvaccinated for covid-19: Secondary | ICD-10-CM | POA: Diagnosis not present

## 2021-02-05 DIAGNOSIS — F1410 Cocaine abuse, uncomplicated: Secondary | ICD-10-CM | POA: Diagnosis present

## 2021-02-05 DIAGNOSIS — E1165 Type 2 diabetes mellitus with hyperglycemia: Secondary | ICD-10-CM | POA: Diagnosis present

## 2021-02-05 DIAGNOSIS — F1110 Opioid abuse, uncomplicated: Secondary | ICD-10-CM | POA: Diagnosis present

## 2021-02-05 DIAGNOSIS — Z79899 Other long term (current) drug therapy: Secondary | ICD-10-CM

## 2021-02-05 DIAGNOSIS — E861 Hypovolemia: Secondary | ICD-10-CM | POA: Diagnosis present

## 2021-02-05 DIAGNOSIS — G928 Other toxic encephalopathy: Secondary | ICD-10-CM | POA: Diagnosis present

## 2021-02-05 DIAGNOSIS — R1115 Cyclical vomiting syndrome unrelated to migraine: Secondary | ICD-10-CM | POA: Diagnosis present

## 2021-02-05 DIAGNOSIS — Z5902 Unsheltered homelessness: Secondary | ICD-10-CM

## 2021-02-05 DIAGNOSIS — F419 Anxiety disorder, unspecified: Secondary | ICD-10-CM | POA: Diagnosis present

## 2021-02-05 DIAGNOSIS — F1210 Cannabis abuse, uncomplicated: Secondary | ICD-10-CM | POA: Diagnosis present

## 2021-02-05 DIAGNOSIS — R7401 Elevation of levels of liver transaminase levels: Secondary | ICD-10-CM | POA: Diagnosis present

## 2021-02-05 HISTORY — DX: Schizoaffective disorder, unspecified: F25.9

## 2021-02-05 LAB — CBC WITH DIFFERENTIAL/PLATELET
Abs Immature Granulocytes: 0 10*3/uL (ref 0.00–0.07)
Basophils Absolute: 0 10*3/uL (ref 0.0–0.1)
Basophils Relative: 0 %
Blasts: 2 %
Eosinophils Absolute: 0.2 10*3/uL (ref 0.0–0.5)
Eosinophils Relative: 1 %
HCT: 48.5 % (ref 39.0–52.0)
Hemoglobin: 16.5 g/dL (ref 13.0–17.0)
Lymphocytes Relative: 30 %
Lymphs Abs: 5 10*3/uL — ABNORMAL HIGH (ref 0.7–4.0)
MCH: 30.2 pg (ref 26.0–34.0)
MCHC: 34 g/dL (ref 30.0–36.0)
MCV: 88.8 fL (ref 80.0–100.0)
Monocytes Absolute: 1.7 10*3/uL — ABNORMAL HIGH (ref 0.1–1.0)
Monocytes Relative: 10 %
Neutro Abs: 9.5 10*3/uL — ABNORMAL HIGH (ref 1.7–7.7)
Neutrophils Relative %: 57 %
Platelets: 438 10*3/uL — ABNORMAL HIGH (ref 150–400)
RBC: 5.46 MIL/uL (ref 4.22–5.81)
RDW: 11.7 % (ref 11.5–15.5)
WBC: 16.6 10*3/uL — ABNORMAL HIGH (ref 4.0–10.5)
nRBC: 0 % (ref 0.0–0.2)

## 2021-02-05 LAB — I-STAT ARTERIAL BLOOD GAS, ED
Acid-base deficit: 2 mmol/L (ref 0.0–2.0)
Bicarbonate: 24.8 mmol/L (ref 20.0–28.0)
Calcium, Ion: 1.22 mmol/L (ref 1.15–1.40)
HCT: 41 % (ref 39.0–52.0)
Hemoglobin: 13.9 g/dL (ref 13.0–17.0)
O2 Saturation: 95 %
Patient temperature: 98.6
Potassium: 5 mmol/L (ref 3.5–5.1)
Sodium: 138 mmol/L (ref 135–145)
TCO2: 26 mmol/L (ref 22–32)
pCO2 arterial: 47.6 mmHg (ref 32.0–48.0)
pH, Arterial: 7.324 — ABNORMAL LOW (ref 7.350–7.450)
pO2, Arterial: 83 mmHg (ref 83.0–108.0)

## 2021-02-05 LAB — URINALYSIS, COMPLETE (UACMP) WITH MICROSCOPIC
Bilirubin Urine: NEGATIVE
Glucose, UA: 100 mg/dL — AB
Hgb urine dipstick: NEGATIVE
Ketones, ur: NEGATIVE mg/dL
Leukocytes,Ua: NEGATIVE
Nitrite: NEGATIVE
Protein, ur: NEGATIVE mg/dL
Specific Gravity, Urine: >1.03 — ABNORMAL HIGH (ref 1.005–1.030)
pH: 6 (ref 5.0–8.0)

## 2021-02-05 LAB — COMPREHENSIVE METABOLIC PANEL
ALT: 49 U/L — ABNORMAL HIGH (ref 0–44)
AST: 51 U/L — ABNORMAL HIGH (ref 15–41)
Albumin: 3.7 g/dL (ref 3.5–5.0)
Alkaline Phosphatase: 103 U/L (ref 38–126)
Anion gap: 17 — ABNORMAL HIGH (ref 5–15)
BUN: 12 mg/dL (ref 6–20)
CO2: 18 mmol/L — ABNORMAL LOW (ref 22–32)
Calcium: 9.3 mg/dL (ref 8.9–10.3)
Chloride: 101 mmol/L (ref 98–111)
Creatinine, Ser: 1.38 mg/dL — ABNORMAL HIGH (ref 0.61–1.24)
GFR, Estimated: >60 mL/min (ref >60)
Glucose, Bld: 299 mg/dL — ABNORMAL HIGH (ref 70–99)
Potassium: 4 mmol/L (ref 3.5–5.1)
Sodium: 136 mmol/L (ref 135–145)
Total Bilirubin: 0.4 mg/dL (ref 0.3–1.2)
Total Protein: 6.9 g/dL (ref 6.5–8.1)

## 2021-02-05 LAB — RAPID URINE DRUG SCREEN, HOSP PERFORMED
Amphetamines: POSITIVE — AB
Barbiturates: NOT DETECTED
Benzodiazepines: NOT DETECTED
Cocaine: POSITIVE — AB
Opiates: NOT DETECTED
Tetrahydrocannabinol: POSITIVE — AB

## 2021-02-05 LAB — GLUCOSE, CAPILLARY: Glucose-Capillary: 149 mg/dL — ABNORMAL HIGH (ref 70–99)

## 2021-02-05 LAB — HEMOGLOBIN A1C
Hgb A1c MFr Bld: 6.7 % — ABNORMAL HIGH (ref 4.8–5.6)
Mean Plasma Glucose: 145.59 mg/dL

## 2021-02-05 LAB — PROTIME-INR
INR: 1 (ref 0.8–1.2)
Prothrombin Time: 13.3 seconds (ref 11.4–15.2)

## 2021-02-05 LAB — ETHANOL: Alcohol, Ethyl (B): <10 mg/dL (ref <10)

## 2021-02-05 LAB — SARS CORONAVIRUS 2 (TAT 6-24 HRS): SARS Coronavirus 2: NEGATIVE

## 2021-02-05 LAB — MRSA NEXT GEN BY PCR, NASAL: MRSA by PCR Next Gen: NOT DETECTED

## 2021-02-05 MED ORDER — FENTANYL CITRATE PF 50 MCG/ML IJ SOSY: 50.0000 ug | PREFILLED_SYRINGE | INTRAMUSCULAR | Status: DC | PRN

## 2021-02-05 MED ORDER — NOREPINEPHRINE 4 MG/250ML-% IV SOLN: 0.0000 ug/min | INTRAVENOUS | Status: DC

## 2021-02-05 MED ORDER — SODIUM CHLORIDE 0.9 % IV BOLUS
1000.0000 mL | Freq: Once | INTRAVENOUS | Status: AC
  Administered 2021-02-05: 1000 mL via INTRAVENOUS

## 2021-02-05 MED ORDER — PROPOFOL 1000 MG/100ML IV EMUL
0.0000 ug/kg/min | INTRAVENOUS | Status: DC
  Administered 2021-02-05: 30 ug/kg/min via INTRAVENOUS
  Administered 2021-02-06: 35 ug/kg/min via INTRAVENOUS
  Administered 2021-02-06: 30 ug/kg/min via INTRAVENOUS
  Filled 2021-02-05 (×3): qty 100

## 2021-02-05 MED ORDER — FENTANYL CITRATE PF 50 MCG/ML IJ SOSY: 100.0000 ug | PREFILLED_SYRINGE | INTRAMUSCULAR | Status: DC | PRN

## 2021-02-05 MED ORDER — NALOXONE HCL 4 MG/10ML IJ SOLN
0.5000 mg/h | INTRAVENOUS | Status: DC
  Filled 2021-02-05: qty 10

## 2021-02-05 MED ORDER — FENTANYL CITRATE (PF) 100 MCG/2ML IJ SOLN
INTRAMUSCULAR | Status: AC
  Filled 2021-02-05: qty 2

## 2021-02-05 MED ORDER — FENTANYL CITRATE PF 50 MCG/ML IJ SOSY
100.0000 ug | PREFILLED_SYRINGE | INTRAMUSCULAR | Status: DC | PRN
Start: 2021-02-05 — End: 2021-02-05

## 2021-02-05 MED ORDER — FENTANYL BOLUS VIA INFUSION
50.0000 ug | INTRAVENOUS | Status: DC | PRN
  Filled 2021-02-05: qty 50

## 2021-02-05 MED ORDER — HEPARIN SODIUM (PORCINE) 5000 UNIT/ML IJ SOLN
5000.0000 [IU] | Freq: Three times a day (TID) | INTRAMUSCULAR | Status: DC
  Administered 2021-02-05 – 2021-02-06 (×3): 5000 [IU] via SUBCUTANEOUS
  Filled 2021-02-05 (×3): qty 1

## 2021-02-05 MED ORDER — DOCUSATE SODIUM 50 MG/5ML PO LIQD: 100.0000 mg | Freq: Two times a day (BID) | ORAL | Status: DC | PRN

## 2021-02-05 MED ORDER — PROPOFOL 1000 MG/100ML IV EMUL
5.0000 ug/kg/min | INTRAVENOUS | Status: DC
  Administered 2021-02-05: 30 ug/kg/min via INTRAVENOUS

## 2021-02-05 MED ORDER — ONDANSETRON HCL 4 MG/2ML IJ SOLN
4.0000 mg | Freq: Once | INTRAMUSCULAR | Status: AC
  Administered 2021-02-05: 4 mg via INTRAVENOUS
  Filled 2021-02-05: qty 2

## 2021-02-05 MED ORDER — STERILE WATER FOR INJECTION IJ SOLN
INTRAMUSCULAR | Status: AC
  Filled 2021-02-05: qty 10

## 2021-02-05 MED ORDER — ROCURONIUM BROMIDE 50 MG/5ML IV SOLN
INTRAVENOUS | Status: DC | PRN
  Administered 2021-02-05: 100 mg via INTRAVENOUS

## 2021-02-05 MED ORDER — POLYETHYLENE GLYCOL 3350 17 G PO PACK: 17.0000 g | PACK | Freq: Every day | ORAL | Status: DC | PRN

## 2021-02-05 MED ORDER — DOCUSATE SODIUM 50 MG/5ML PO LIQD
100.0000 mg | Freq: Two times a day (BID) | ORAL | Status: DC
  Administered 2021-02-05: 100 mg
  Filled 2021-02-05: qty 10

## 2021-02-05 MED ORDER — FENTANYL CITRATE PF 50 MCG/ML IJ SOSY: 50.0000 ug | PREFILLED_SYRINGE | Freq: Once | INTRAMUSCULAR | Status: DC

## 2021-02-05 MED ORDER — ZIPRASIDONE MESYLATE 20 MG IM SOLR
10.0000 mg | Freq: Once | INTRAMUSCULAR | Status: DC
  Filled 2021-02-05: qty 20

## 2021-02-05 MED ORDER — FENTANYL CITRATE PF 50 MCG/ML IJ SOSY
50.0000 ug | PREFILLED_SYRINGE | INTRAMUSCULAR | Status: DC | PRN
  Administered 2021-02-05: 50 ug via INTRAVENOUS
  Filled 2021-02-05: qty 1

## 2021-02-05 MED ORDER — SODIUM CHLORIDE 0.9 % IV SOLN: INTRAVENOUS | Status: DC

## 2021-02-05 MED ORDER — PIPERACILLIN-TAZOBACTAM 3.375 G IVPB
3.3750 g | Freq: Three times a day (TID) | INTRAVENOUS | Status: DC
  Administered 2021-02-05 – 2021-02-06 (×2): 3.375 g via INTRAVENOUS
  Filled 2021-02-05 (×3): qty 50

## 2021-02-05 MED ORDER — POLYETHYLENE GLYCOL 3350 17 G PO PACK: 17.0000 g | PACK | Freq: Every day | ORAL | Status: DC

## 2021-02-05 MED ORDER — FENTANYL CITRATE (PF) 100 MCG/2ML IJ SOLN
INTRAMUSCULAR | Status: DC | PRN
  Administered 2021-02-05: 100 ug via INTRAVENOUS

## 2021-02-05 MED ORDER — ALBUTEROL SULFATE (2.5 MG/3ML) 0.083% IN NEBU: 2.5000 mg | INHALATION_SOLUTION | Freq: Four times a day (QID) | RESPIRATORY_TRACT | Status: DC | PRN

## 2021-02-05 MED ORDER — INSULIN ASPART 100 UNIT/ML IJ SOLN
0.0000 [IU] | INTRAMUSCULAR | Status: DC
  Administered 2021-02-05 – 2021-02-06 (×2): 2 [IU] via SUBCUTANEOUS
  Administered 2021-02-06: 3 [IU] via SUBCUTANEOUS

## 2021-02-05 MED ORDER — ZIPRASIDONE MESYLATE 20 MG IM SOLR
20.0000 mg | Freq: Once | INTRAMUSCULAR | Status: AC
  Administered 2021-02-05: 20 mg via INTRAMUSCULAR

## 2021-02-05 MED ORDER — ONDANSETRON 4 MG PO TBDP
4.0000 mg | ORAL_TABLET | Freq: Once | ORAL | Status: AC
  Administered 2021-02-05: 4 mg via ORAL
  Filled 2021-02-05: qty 1

## 2021-02-05 MED ORDER — ETOMIDATE 2 MG/ML IV SOLN
INTRAVENOUS | Status: DC | PRN
  Administered 2021-02-05: 30 mg via INTRAVENOUS

## 2021-02-05 MED ORDER — FENTANYL 2500MCG IN NS 250ML (10MCG/ML) PREMIX INFUSION: 25.0000 ug/h | INTRAVENOUS | Status: DC

## 2021-02-05 MED ORDER — ORAL CARE MOUTH RINSE
15.0000 mL | OROMUCOSAL | Status: DC
  Administered 2021-02-05 – 2021-02-06 (×7): 15 mL via OROMUCOSAL

## 2021-02-05 MED ORDER — ONDANSETRON HCL 4 MG/2ML IJ SOLN
4.0000 mg | Freq: Once | INTRAMUSCULAR | Status: AC
  Administered 2021-02-05: 4 mg via INTRAVENOUS

## 2021-02-05 MED ORDER — ACETAMINOPHEN 160 MG/5ML PO SOLN
650.0000 mg | ORAL | Status: DC | PRN
  Administered 2021-02-05: 650 mg
  Filled 2021-02-05: qty 20.3

## 2021-02-05 MED ORDER — CHLORHEXIDINE GLUCONATE 0.12% ORAL RINSE (MEDLINE KIT)
15.0000 mL | Freq: Two times a day (BID) | OROMUCOSAL | Status: DC
  Administered 2021-02-05 – 2021-02-06 (×2): 15 mL via OROMUCOSAL

## 2021-02-05 NOTE — ED Notes (Signed)
Pt alert and oriented.  Continues to rip off oxygen and monitoring equipment.  Sats of 88%.  Taken to Ct.  Mother at bedside.

## 2021-02-05 NOTE — ED Notes (Signed)
Pt attempting to pull on his monitoring wires and Iv, pt redirected back into bed, EDP made aware, orders for restraints placed
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
## 2021-02-05 NOTE — Progress Notes (Signed)
eLink Physician-Brief Progress Note Patient Name: [name] DOB: Aug 10, 1985 MRN: [id number]   Date of Service  02/05/2021  HPI/Events of Note  Pt intubated; agitated  eICU Interventions  Ordered 4 point restraints after discussion with RN.  Added PRN APAP due to low grade temp  Ordered foley  Narcan gtt not started; agree with holding it as he's sedated on mech ventilation and already requires restraints to prevent w/drawal of devices     Intervention Category Evaluation Type: New Patient Evaluation  Jacinta Shoe 02/05/2021, 9:59 PM

## 2021-02-05 NOTE — ED Notes (Addendum)
Pt walking around room, attempting to remove IV, this RN assisted pt to chair, pt started to put on shoes stating he is leaving and that the IV fluids are making him sick. EDP called to bedside, pt given IV Zofran, assisted back into bed. Connected to the monitor, oxygen saturation 78% on room air, pt placed on 5L [place] and fluids reconnected. Pt now sleeping on stretcher

## 2021-02-05 NOTE — Progress Notes (Signed)
eLink Physician-Brief Progress Note Patient Name: [name] DOB: 18-Jun-1985 MRN: [id number]   Date of Service  02/05/2021  HPI/Events of Note  Pt febrile and MAP soft  eICU Interventions  Ok to use a cooling blanket.  Start levo if MAP < 65     Intervention Category Intermediate Interventions: Other:  Jacinta Shoe 02/05/2021, 11:57 PM
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
## 2021-02-05 NOTE — ED Provider Notes (Signed)
  Procedure Name: Intubation Date/Time: 02/05/2021 7:29 PM Performed by: Violet Baldy, MD Pre-anesthesia Checklist: Patient identified, Emergency Drugs available, Suction available, Patient being monitored and Timeout performed Oxygen Delivery Method: Ambu bag Preoxygenation: Pre-oxygenation with 100% oxygen Induction Type: Rapid sequence Ventilation: Two handed mask ventilation required, Oral airway inserted - appropriate to patient size and Mask ventilation with difficulty Laryngoscope Size: Mac and 3 Grade View: Grade I Tube size: 8.0 mm Number of attempts: 2 Airway Equipment and Method: Stylet and Video-laryngoscopy Placement Confirmation: ETT inserted through vocal cords under direct vision, CO2 detector and Breath sounds checked- equal and bilateral Secured at: 25 cm Tube secured with: ETT holder Dental Injury: Teeth and Oropharynx as per pre-operative assessment  Difficulty Due To: Difficulty was anticipated Future Recommendations: Recommend- induction with short-acting agent, and alternative techniques readily available        Violet Baldy, MD 02/05/21 1931    Elnora Morrison, MD 02/05/21 2348

## 2021-02-05 NOTE — ED Notes (Signed)
Notified pt's mother of intubation status.

## 2021-02-05 NOTE — Progress Notes (Addendum)
Pharmacy Antibiotic Note  [name] is a 35 y.o. male admitted on 02/05/2021 with  aspiration pneumonia .  Pharmacy has been consulted for Zosyn dosing.  WBC 16.6, afebrile SCr 1.38 - baseline is 0.8-1.0  Plan: Zosyn 3.375 gm IV every 8 hours  Monitor renal function, CBC, cultures/sensitivities, LOT and de-escalate as able  Temp (24hrs), Avg:97.6 F (36.4 C), Min:97.6 F (36.4 C), Max:97.6 F (36.4 C)  Recent Labs  Lab 02/05/21 1428  WBC 16.6*  CREATININE 1.38*    Estimated Creatinine Clearance: 93.8 mL/min (A) (by C-G formula based on SCr of 1.38 mg/dL (H)).    No Known Allergies  Antimicrobials this admission: Zosyn 9/19 >>   Dose adjustments this admission: None  Microbiology results: 9/19 BCx: pending 9/19 Trach aspirate: pending  Thank you for allowing pharmacy to be a part of this patient's care.  Filbert Schilder, PharmD PGY1 Pharmacy Resident 02/05/2021  8:22 PM  Please check [email or web address] for unit-specific pharmacy phone numbers.

## 2021-02-05 NOTE — ED Notes (Signed)
Patient sat decreased to 88% on [name]/ placed on NRB increased sat to 99%.

## 2021-02-05 NOTE — Progress Notes (Signed)
RT note: RT and RN transported vent patient from ED to 2M09. Vital signs stab;le through out.
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
## 2021-02-06 ENCOUNTER — Inpatient Hospital Stay (HOSPITAL_COMMUNITY): Payer: Medicare Other

## 2021-02-06 DIAGNOSIS — J9601 Acute respiratory failure with hypoxia: Secondary | ICD-10-CM | POA: Diagnosis not present

## 2021-02-06 LAB — BASIC METABOLIC PANEL
Anion gap: 10 (ref 5–15)
BUN: 11 mg/dL (ref 6–20)
CO2: 22 mmol/L (ref 22–32)
Calcium: 8.9 mg/dL (ref 8.9–10.3)
Chloride: 104 mmol/L (ref 98–111)
Creatinine, Ser: 1.26 mg/dL — ABNORMAL HIGH (ref 0.61–1.24)
GFR, Estimated: >60 mL/min (ref >60)
Glucose, Bld: 150 mg/dL — ABNORMAL HIGH (ref 70–99)
Potassium: 4.4 mmol/L (ref 3.5–5.1)
Sodium: 136 mmol/L (ref 135–145)

## 2021-02-06 LAB — GLUCOSE, CAPILLARY
Glucose-Capillary: 109 mg/dL — ABNORMAL HIGH (ref 70–99)
Glucose-Capillary: 124 mg/dL — ABNORMAL HIGH (ref 70–99)
Glucose-Capillary: 138 mg/dL — ABNORMAL HIGH (ref 70–99)
Glucose-Capillary: 163 mg/dL — ABNORMAL HIGH (ref 70–99)
Glucose-Capillary: 91 mg/dL (ref 70–99)

## 2021-02-06 LAB — CBC
HCT: 43.5 % (ref 39.0–52.0)
Hemoglobin: 14.8 g/dL (ref 13.0–17.0)
MCH: 30 pg (ref 26.0–34.0)
MCHC: 34 g/dL (ref 30.0–36.0)
MCV: 88.2 fL (ref 80.0–100.0)
Platelets: 258 10*3/uL (ref 150–400)
RBC: 4.93 MIL/uL (ref 4.22–5.81)
RDW: 11.9 % (ref 11.5–15.5)
WBC: 15.2 10*3/uL — ABNORMAL HIGH (ref 4.0–10.5)
nRBC: 0 % (ref 0.0–0.2)

## 2021-02-06 LAB — HIV ANTIBODY (ROUTINE TESTING W REFLEX): HIV Screen 4th Generation wRfx: NONREACTIVE

## 2021-02-06 LAB — PHOSPHORUS: Phosphorus: 3.6 mg/dL (ref 2.5–4.6)

## 2021-02-06 LAB — MAGNESIUM: Magnesium: 1.7 mg/dL (ref 1.7–2.4)

## 2021-02-06 LAB — TRIGLYCERIDES: Triglycerides: 256 mg/dL — ABNORMAL HIGH (ref <150)

## 2021-02-06 MED ORDER — QUETIAPINE FUMARATE 25 MG PO TABS: 25.0000 mg | ORAL_TABLET | Freq: Two times a day (BID) | ORAL | Status: DC

## 2021-02-06 MED ORDER — ACETAMINOPHEN 325 MG PO TABS: 650.0000 mg | ORAL_TABLET | Freq: Four times a day (QID) | ORAL | Status: DC | PRN

## 2021-02-06 MED ORDER — FENTANYL CITRATE (PF) 100 MCG/2ML IJ SOLN
50.0000 ug | INTRAMUSCULAR | Status: DC | PRN
  Administered 2021-02-06: 100 ug via INTRAVENOUS
  Administered 2021-02-06: 50 ug via INTRAVENOUS
  Filled 2021-02-06 (×2): qty 2

## 2021-02-06 MED ORDER — DOCUSATE SODIUM 100 MG PO CAPS: 100.0000 mg | ORAL_CAPSULE | Freq: Two times a day (BID) | ORAL | Status: DC | PRN

## 2021-02-06 MED ORDER — QUETIAPINE FUMARATE 25 MG PO TABS
25.0000 mg | ORAL_TABLET | Freq: Two times a day (BID) | ORAL | Status: DC
  Administered 2021-02-06: 25 mg via ORAL
  Filled 2021-02-06: qty 1

## 2021-02-06 MED ORDER — NOREPINEPHRINE 4 MG/250ML-% IV SOLN
2.0000 ug/min | INTRAVENOUS | Status: DC
  Administered 2021-02-06: 2 ug/min via INTRAVENOUS
  Filled 2021-02-06: qty 250

## 2021-02-06 MED ORDER — NICOTINE 21 MG/24HR TD PT24
21.0000 mg | MEDICATED_PATCH | Freq: Every day | TRANSDERMAL | Status: DC
  Administered 2021-02-06: 21 mg via TRANSDERMAL
  Filled 2021-02-06: qty 1

## 2021-02-06 MED ORDER — FENTANYL CITRATE (PF) 100 MCG/2ML IJ SOLN: 50.0000 ug | INTRAMUSCULAR | Status: DC | PRN

## 2021-02-06 MED ORDER — MIDAZOLAM HCL 2 MG/2ML IJ SOLN: 4.0000 mg | INTRAMUSCULAR | Status: DC | PRN

## 2021-02-06 MED ORDER — SODIUM CHLORIDE 0.9 % IV SOLN
1.0000 g | INTRAVENOUS | Status: DC
  Administered 2021-02-06: 1 g via INTRAVENOUS
  Filled 2021-02-06: qty 10

## 2021-02-06 MED ORDER — INSULIN ASPART 100 UNIT/ML IJ SOLN: 0.0000 [IU] | Freq: Three times a day (TID) | INTRAMUSCULAR | Status: DC

## 2021-02-06 MED ORDER — CHLORHEXIDINE GLUCONATE CLOTH 2 % EX PADS
6.0000 | MEDICATED_PAD | Freq: Every day | CUTANEOUS | Status: DC
  Administered 2021-02-05: 6 via TOPICAL

## 2021-02-06 MED ORDER — POLYETHYLENE GLYCOL 3350 17 G PO PACK: 17.0000 g | PACK | Freq: Every day | ORAL | Status: DC | PRN

## 2021-02-06 MED ORDER — HALOPERIDOL LACTATE 5 MG/ML IJ SOLN
5.0000 mg | Freq: Four times a day (QID) | INTRAMUSCULAR | Status: DC | PRN
  Filled 2021-02-06: qty 1

## 2021-02-06 MED ORDER — SODIUM CHLORIDE 0.9 % IV SOLN
250.0000 mL | INTRAVENOUS | Status: DC
  Administered 2021-02-06: 250 mL via INTRAVENOUS
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
## 2021-02-06 NOTE — Progress Notes (Signed)
eLink Physician-Brief Progress Note Patient Name: [name] DOB: 1986/02/19 MRN: [id number]   Date of Service  02/06/2021  HPI/Events of Note  Pt biting ETT  eICU Interventions  Added prn versed pushes     Intervention Category Minor Interventions: Agitation / anxiety - evaluation and management  Jacinta Shoe 02/06/2021, 5:41 AM
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
## 2021-02-06 NOTE — ED Notes (Signed)
Pt continues to flail despite restraints and to grab at NRB.  Sats in 70's when he removes NRB.  Pt has attempted to leave multiple times.  MD notified.

## 2021-02-06 NOTE — Procedures (Signed)
Extubation Procedure Note  Patient Details:   Name: Papa Piercefield DOB: 1986/05/08 MRN: [id number]   Airway Documentation:    Vent end date: 02/06/21 Vent end time: 0909   Evaluation  O2 sats: stable throughout Complications: No apparent complications Patient did tolerate procedure well. Bilateral Breath Sounds: Diminished   Yes  Leeanne Butters 02/06/2021, 9:10 AM

## 2021-02-07 ENCOUNTER — Encounter (HOSPITAL_COMMUNITY): Payer: Self-pay

## 2021-02-11 LAB — CULTURE, BLOOD (ROUTINE X 2)
Culture: NO GROWTH
Special Requests: ADEQUATE

## 2021-09-30 ENCOUNTER — Emergency Department (HOSPITAL_COMMUNITY)
Admission: EM | Admit: 2021-09-30 | Discharge: 2021-09-30 | Disposition: A | Discharge status: Home / Self Care | Payer: Medicare Other | Attending: Emergency Medicine | Admitting: Emergency Medicine

## 2021-09-30 DIAGNOSIS — Z7984 Long term (current) use of oral hypoglycemic drugs: Secondary | ICD-10-CM | POA: Insufficient documentation

## 2021-09-30 DIAGNOSIS — I10 Essential (primary) hypertension: Secondary | ICD-10-CM | POA: Diagnosis not present

## 2021-09-30 DIAGNOSIS — T40601A Poisoning by unspecified narcotics, accidental (unintentional), initial encounter: Secondary | ICD-10-CM

## 2021-09-30 DIAGNOSIS — T402X1A Poisoning by other opioids, accidental (unintentional), initial encounter: Secondary | ICD-10-CM | POA: Insufficient documentation

## 2021-09-30 LAB — I-STAT CHEM 8, ED
BUN: 10 mg/dL (ref 6–20)
Calcium, Ion: 1.18 mmol/L (ref 1.15–1.40)
Chloride: 103 mmol/L (ref 98–111)
Creatinine, Ser: 0.9 mg/dL (ref 0.61–1.24)
Glucose, Bld: 157 mg/dL — ABNORMAL HIGH (ref 70–99)
HCT: 46 % (ref 39.0–52.0)
Hemoglobin: 15.6 g/dL (ref 13.0–17.0)
Potassium: 4.1 mmol/L (ref 3.5–5.1)
Sodium: 140 mmol/L (ref 135–145)
TCO2: 27 mmol/L (ref 22–32)

## 2021-09-30 MED ORDER — ONDANSETRON 8 MG PO TBDP
8.0000 mg | ORAL_TABLET | Freq: Once | ORAL | Status: AC
  Administered 2021-09-30: 8 mg via ORAL
  Filled 2021-09-30: qty 1

## 2021-09-30 MED ORDER — NALOXONE HCL 4 MG/0.1ML NA LIQD: NASAL | 0 refills | Status: AC

## 2021-09-30 MED ORDER — ONDANSETRON 8 MG PO TBDP: 8.0000 mg | ORAL_TABLET | Freq: Three times a day (TID) | ORAL | 0 refills | Status: AC | PRN

## 2021-09-30 NOTE — ED Triage Notes (Signed)
Pt BIBA from [place]. Pt Od'd on heroin, stating insufflation as method. States OD was accidental. Pt became non-responsive- was given 4mg  In narcan by mother, and 1mg  narcan by fire department.  ? ?Pt given 4mg  zofran, for N/V. ?Hx OD ? ?Aox4 ? ?BP: 120/70 ?HR: 100 ?SPO2: 100 RA ?CBG: 150 ?
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
## 2021-12-16 ENCOUNTER — Other Ambulatory Visit: Payer: Self-pay

## 2021-12-16 ENCOUNTER — Emergency Department (HOSPITAL_COMMUNITY)
Admission: EM | Admit: 2021-12-16 | Discharge: 2021-12-16 | Disposition: A | Discharge status: Home / Self Care | Payer: Medicare Other | Attending: Emergency Medicine | Admitting: Emergency Medicine

## 2021-12-16 ENCOUNTER — Encounter (HOSPITAL_COMMUNITY): Payer: Self-pay

## 2021-12-16 DIAGNOSIS — M7918 Myalgia, other site: Secondary | ICD-10-CM | POA: Insufficient documentation

## 2021-12-16 DIAGNOSIS — M25572 Pain in left ankle and joints of left foot: Secondary | ICD-10-CM | POA: Diagnosis not present

## 2021-12-16 DIAGNOSIS — S39012A Strain of muscle, fascia and tendon of lower back, initial encounter: Secondary | ICD-10-CM | POA: Insufficient documentation

## 2021-12-16 DIAGNOSIS — Z7984 Long term (current) use of oral hypoglycemic drugs: Secondary | ICD-10-CM | POA: Insufficient documentation

## 2021-12-16 DIAGNOSIS — M791 Myalgia, unspecified site: Secondary | ICD-10-CM

## 2021-12-16 DIAGNOSIS — S3992XA Unspecified injury of lower back, initial encounter: Secondary | ICD-10-CM | POA: Diagnosis present

## 2021-12-16 MED ORDER — ACETAMINOPHEN 325 MG PO TABS: 650.0000 mg | ORAL_TABLET | Freq: Three times a day (TID) | ORAL | 0 refills | Status: AC | PRN

## 2021-12-16 MED ORDER — IBUPROFEN 600 MG PO TABS: 600.0000 mg | ORAL_TABLET | Freq: Three times a day (TID) | ORAL | 0 refills | Status: AC | PRN

## 2021-12-16 MED ORDER — ACETAMINOPHEN 325 MG PO TABS
650.0000 mg | ORAL_TABLET | Freq: Once | ORAL | Status: AC
  Administered 2021-12-16: 650 mg via ORAL
  Filled 2021-12-16: qty 2

## 2021-12-16 MED ORDER — IBUPROFEN 200 MG PO TABS
600.0000 mg | ORAL_TABLET | Freq: Once | ORAL | Status: AC
  Administered 2021-12-16: 600 mg via ORAL
  Filled 2021-12-16: qty 3

## 2021-12-16 MED ORDER — CYCLOBENZAPRINE HCL 10 MG PO TABS: 10.0000 mg | ORAL_TABLET | Freq: Every day | ORAL | 0 refills | Status: AC
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
## 2021-12-16 NOTE — Discharge Instructions (Addendum)
Please drink lots of water to stay hydrated.  You can take ibuprofen and Tylenol as needed for muscle pains and aches.  I gave you a few Flexeril which is a muscle relaxer that you can take at night.  This medicine will make you drowsy.   Please call your primary care provider's office to schedule follow-up appointment as soon as possible.
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
## 2022-02-17 DEATH — deceased

## 2023-06-15 IMAGING — CT CT HEAD W/O CM
4 series · 16 of 47 positions shown, 18 images · non-contrast
Comparison: None.

CLINICAL DATA: Delirium

EXAM:
CT HEAD WITHOUT CONTRAST
TECHNIQUE: Contiguous axial images were obtained from the base of the skull
through the vertex without intravenous contrast.

[Series 2: head wo · axial · 0.46mm/px · z∈[-156,-6]mm · 7 of 42 slices shown, 9 images]
[im 6/42  brain]
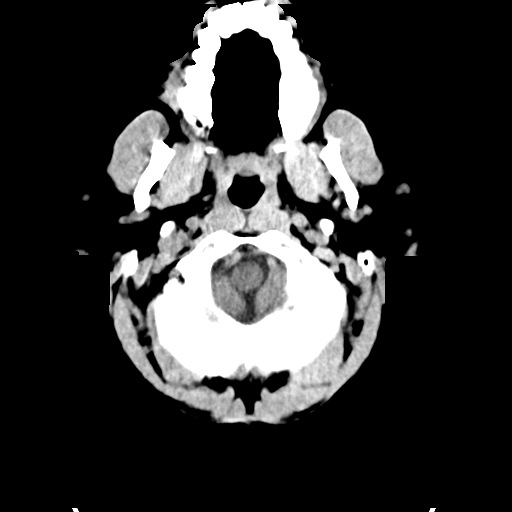
[im 6/42  bone]
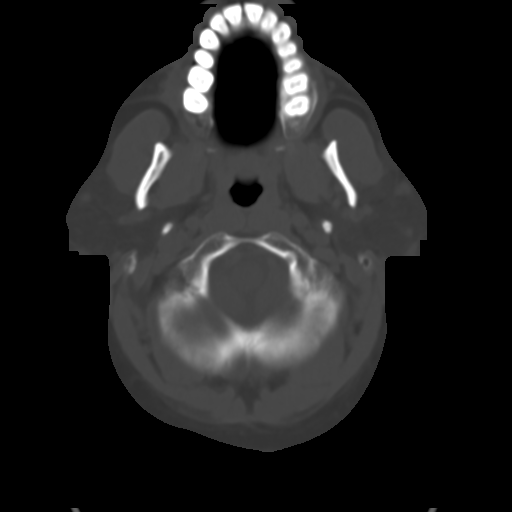
[im 11/42  brain]
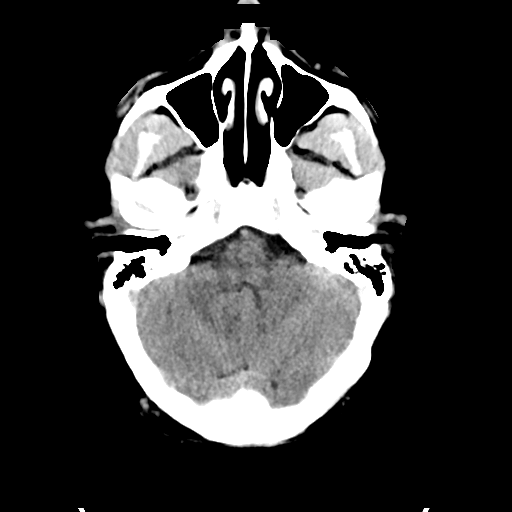
[im 16/42  brain]
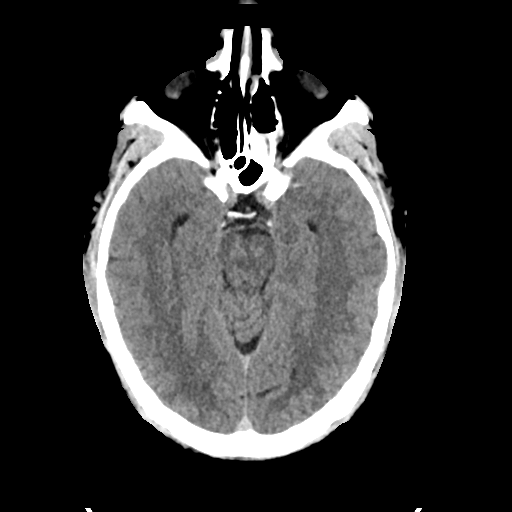
[im 21/42  brain]
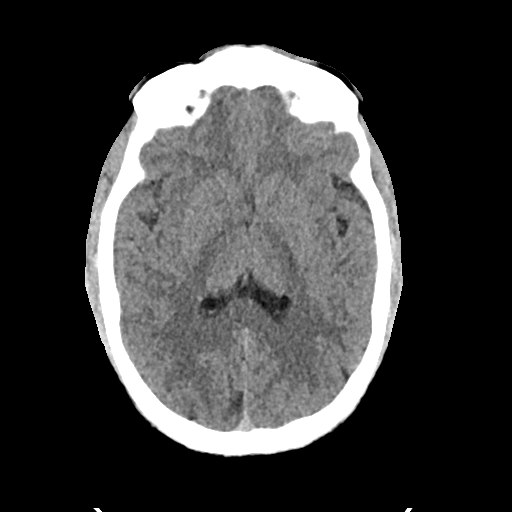
[im 26/42  brain]
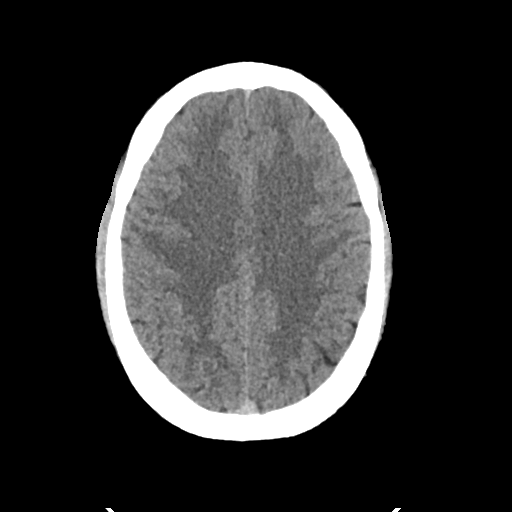
[im 26/42  bone]
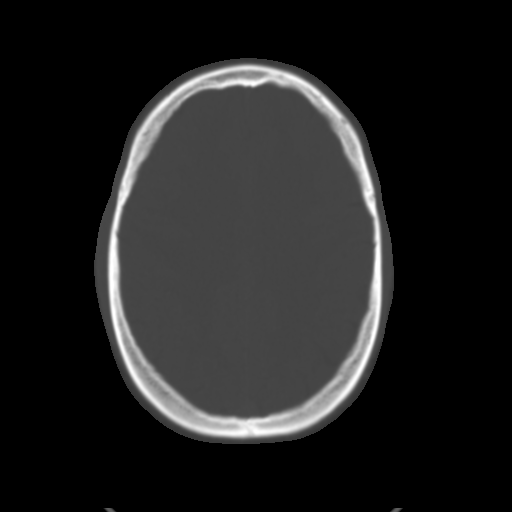
[im 31/42  brain]
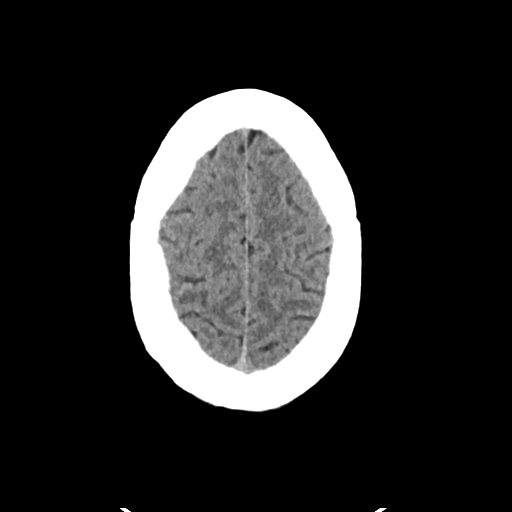
[im 36/42  brain]
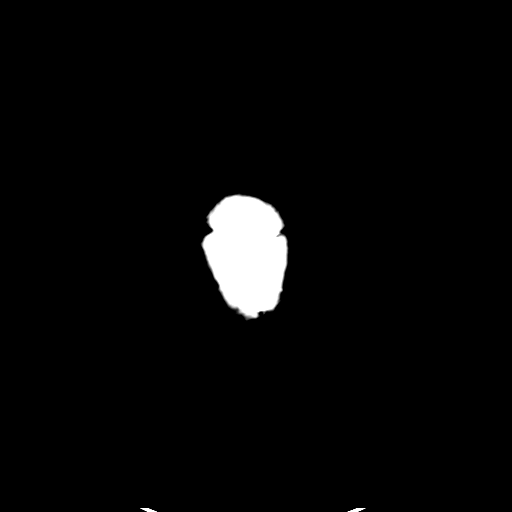

[Series 3: head bone · axial · 0.46mm/px · z∈[-162,-122]mm · 3 of 104 slices shown]
[im 11/104  bone]
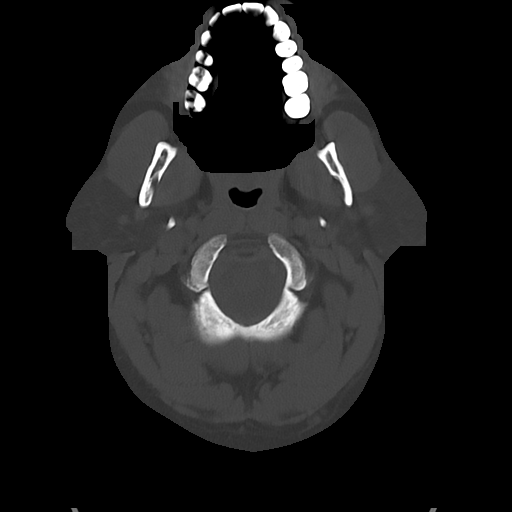
[im 21/104  bone]
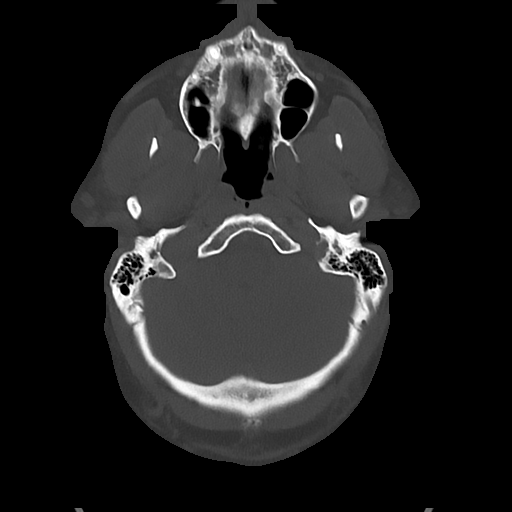
[im 31/104  bone]
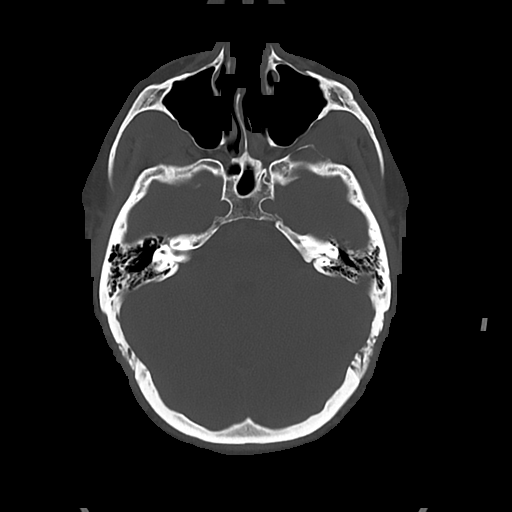

[Series 4: cor soft · coronal · 0.33mm/px · 3 of 79 slices shown]
[im 27/79  brain]
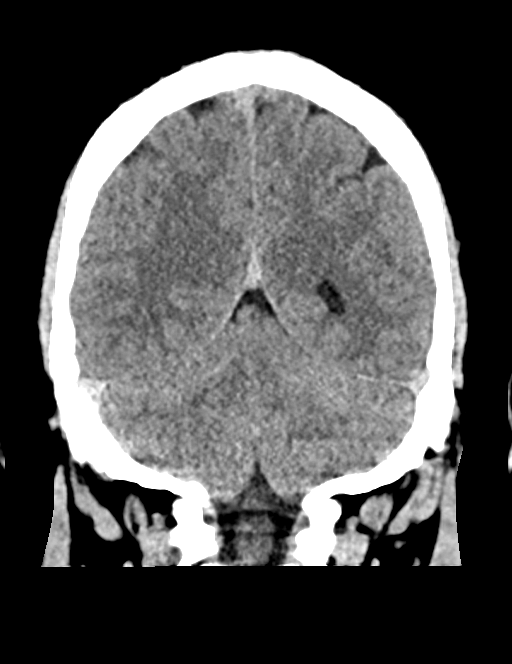
[im 35/79  brain]
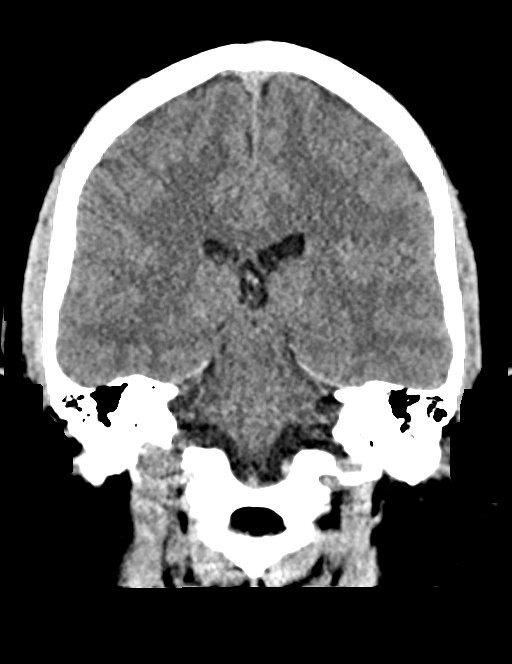
[im 44/79  brain]
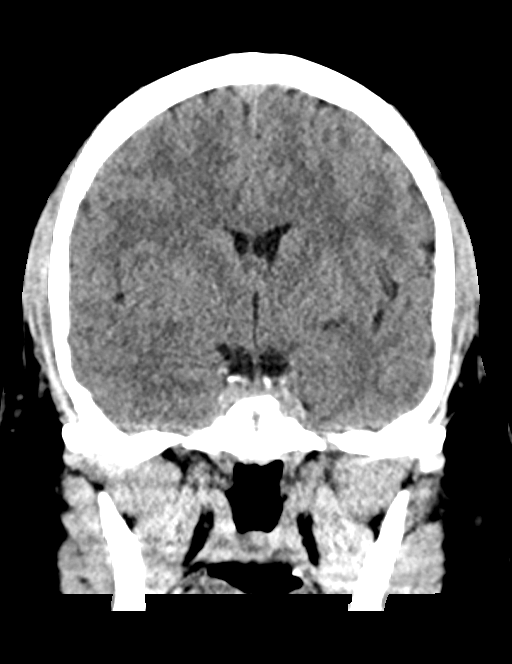

[Series 5: sag soft · sagittal · 0.43mm/px · 3 of 66 slices shown]
[im 22/66  brain]
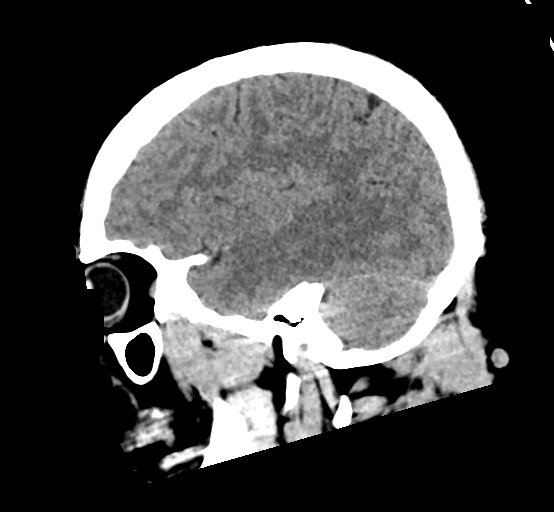
[im 33/66  brain]
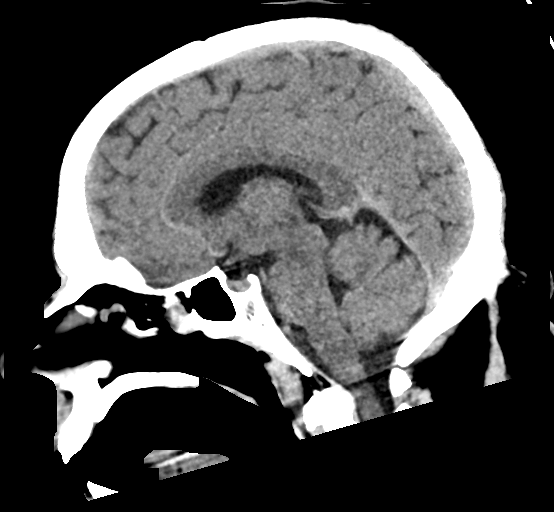
[im 44/66  brain]
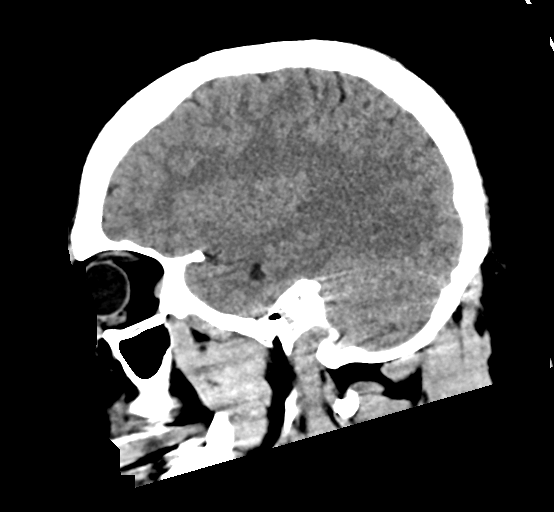

[16 of 47 positions shown; findings below may reference images not displayed]

FINDINGS: Brain: Normal anatomic configuration. No abnormal intra or
extra-axial mass lesion or fluid collection. No abnormal mass effect
or midline shift. No evidence of acute intracranial hemorrhage or
infarct. Ventricular size is normal. Cerebellum unremarkable.

Vascular: Unremarkable

Skull: Intact

Sinuses/Orbits: There is opacification of several left ethmoid air
cells. Remaining paranasal sinuses are clear. No air-fluid levels.
Orbits are unremarkable.

Other: Mastoid air cells and middle ear cavities are clear.
IMPRESSION: No acute intracranial abnormality.

Mild left ethmoid sinus disease.

## 2023-06-18 IMAGING — DX DG CHEST 1V PORT
1 series · 1 of 1 positions shown · non-contrast
Comparison: 12/03/2020.

CLINICAL DATA: Shortness of breath.  Drug overdose.

EXAM:
PORTABLE CHEST 1 VIEW

[chest]
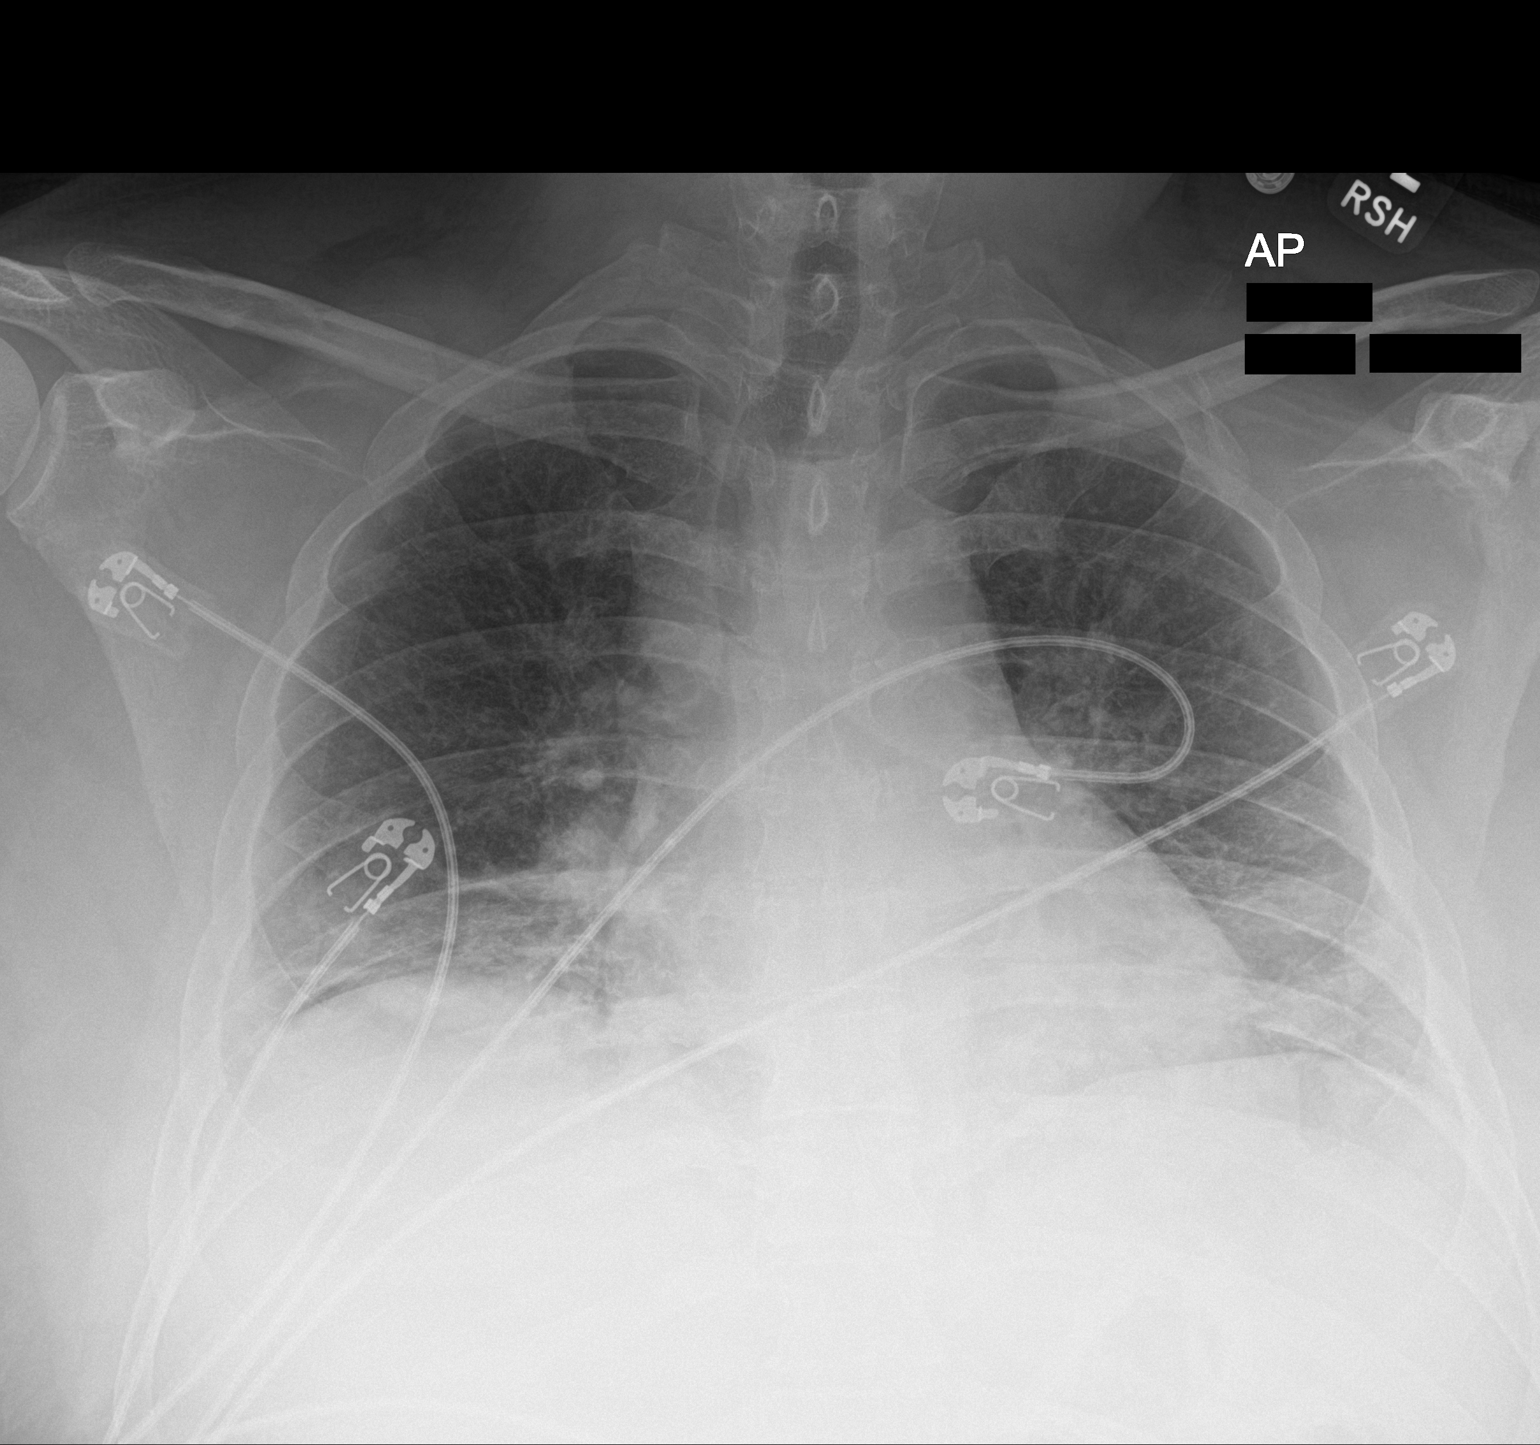

[1 of 1 positions shown; findings below may reference images not displayed]

FINDINGS: Borderline cardiomegaly. No pulmonary venous congestion. Low lung
volumes with persistent bibasilar atelectasis/infiltrates. No
pleural effusion or pneumothorax.
IMPRESSION: 1.  Borderline cardiomegaly.  No pulmonary venous congestion.

2.  Lung volumes with persistent bibasilar atelectasis/infiltrates.
